# Patient Record
Sex: Male | Born: 1950 | Race: White | Hispanic: No | Marital: Married | Smoking: Former smoker
Health system: Southern US, Community
[De-identification: ages and names within clinical notes are randomized; demographics above are authoritative.]

## PROBLEM LIST (undated history)

## (undated) DIAGNOSIS — T7840XA Allergy, unspecified, initial encounter: Secondary | ICD-10-CM

## (undated) DIAGNOSIS — U071 COVID-19: Secondary | ICD-10-CM

## (undated) DIAGNOSIS — J449 Chronic obstructive pulmonary disease, unspecified: Secondary | ICD-10-CM

## (undated) DIAGNOSIS — I2510 Atherosclerotic heart disease of native coronary artery without angina pectoris: Secondary | ICD-10-CM

## (undated) DIAGNOSIS — D649 Anemia, unspecified: Secondary | ICD-10-CM

## (undated) DIAGNOSIS — N189 Chronic kidney disease, unspecified: Secondary | ICD-10-CM

## (undated) DIAGNOSIS — H269 Unspecified cataract: Secondary | ICD-10-CM

## (undated) DIAGNOSIS — F419 Anxiety disorder, unspecified: Secondary | ICD-10-CM

## (undated) DIAGNOSIS — I10 Essential (primary) hypertension: Secondary | ICD-10-CM

## (undated) DIAGNOSIS — Z5189 Encounter for other specified aftercare: Secondary | ICD-10-CM

## (undated) DIAGNOSIS — J439 Emphysema, unspecified: Secondary | ICD-10-CM

## (undated) HISTORY — DX: Chronic obstructive pulmonary disease, unspecified: J44.9

## (undated) HISTORY — DX: Anxiety disorder, unspecified: F41.9

## (undated) HISTORY — DX: Anemia, unspecified: D64.9

## (undated) HISTORY — DX: Chronic kidney disease, unspecified: N18.9

## (undated) HISTORY — DX: Emphysema, unspecified: J43.9

## (undated) HISTORY — PX: CORONARY ARTERY BYPASS GRAFT: SHX141

## (undated) HISTORY — DX: Unspecified cataract: H26.9

## (undated) HISTORY — PX: CARDIAC SURGERY: SHX584

## (undated) HISTORY — DX: Allergy, unspecified, initial encounter: T78.40XA

---

## 2004-10-05 ENCOUNTER — Ambulatory Visit: Payer: Self-pay | Admitting: Family Medicine

## 2009-06-05 ENCOUNTER — Ambulatory Visit (HOSPITAL_COMMUNITY): Admission: RE | Admit: 2009-06-05 | Discharge: 2009-06-05 | Payer: Self-pay | Admitting: Family Medicine

## 2010-07-23 LAB — CREATININE, SERUM
Creatinine, Ser: 1.05 mg/dL (ref 0.4–1.5)
GFR calc Af Amer: >60 mL/min (ref >60)
GFR calc non Af Amer: >60 mL/min (ref >60)

## 2013-08-11 DIAGNOSIS — F419 Anxiety disorder, unspecified: Secondary | ICD-10-CM | POA: Insufficient documentation

## 2015-05-07 HISTORY — PX: REPAIR OF PERFORATED ULCER: SHX6065

## 2016-04-01 DIAGNOSIS — Z1589 Genetic susceptibility to other disease: Secondary | ICD-10-CM | POA: Insufficient documentation

## 2016-04-01 DIAGNOSIS — F17210 Nicotine dependence, cigarettes, uncomplicated: Secondary | ICD-10-CM | POA: Insufficient documentation

## 2016-09-23 DIAGNOSIS — K922 Gastrointestinal hemorrhage, unspecified: Secondary | ICD-10-CM | POA: Insufficient documentation

## 2016-09-23 DIAGNOSIS — D62 Acute posthemorrhagic anemia: Secondary | ICD-10-CM | POA: Insufficient documentation

## 2016-09-23 DIAGNOSIS — J189 Pneumonia, unspecified organism: Secondary | ICD-10-CM | POA: Insufficient documentation

## 2016-09-23 DIAGNOSIS — K259 Gastric ulcer, unspecified as acute or chronic, without hemorrhage or perforation: Secondary | ICD-10-CM | POA: Insufficient documentation

## 2016-09-23 DIAGNOSIS — I7143 Infrarenal abdominal aortic aneurysm, without rupture: Secondary | ICD-10-CM | POA: Insufficient documentation

## 2016-09-23 DIAGNOSIS — N400 Enlarged prostate without lower urinary tract symptoms: Secondary | ICD-10-CM | POA: Insufficient documentation

## 2016-10-09 DIAGNOSIS — I5022 Chronic systolic (congestive) heart failure: Secondary | ICD-10-CM | POA: Insufficient documentation

## 2016-10-09 DIAGNOSIS — I255 Ischemic cardiomyopathy: Secondary | ICD-10-CM | POA: Insufficient documentation

## 2016-10-15 DIAGNOSIS — R739 Hyperglycemia, unspecified: Secondary | ICD-10-CM | POA: Insufficient documentation

## 2016-10-17 DIAGNOSIS — Z716 Tobacco abuse counseling: Secondary | ICD-10-CM | POA: Insufficient documentation

## 2016-12-02 DIAGNOSIS — Z951 Presence of aortocoronary bypass graft: Secondary | ICD-10-CM | POA: Insufficient documentation

## 2017-02-19 ENCOUNTER — Other Ambulatory Visit: Payer: Self-pay | Admitting: Orthopedic Surgery

## 2017-05-01 ENCOUNTER — Other Ambulatory Visit: Payer: Self-pay | Admitting: Gastroenterology

## 2017-05-01 DIAGNOSIS — R197 Diarrhea, unspecified: Secondary | ICD-10-CM

## 2017-05-01 DIAGNOSIS — R1032 Left lower quadrant pain: Secondary | ICD-10-CM

## 2017-05-05 DIAGNOSIS — F17209 Nicotine dependence, unspecified, with unspecified nicotine-induced disorders: Secondary | ICD-10-CM | POA: Insufficient documentation

## 2017-05-07 ENCOUNTER — Other Ambulatory Visit: Payer: Self-pay

## 2017-06-02 ENCOUNTER — Other Ambulatory Visit: Payer: Self-pay

## 2017-06-02 ENCOUNTER — Encounter (HOSPITAL_COMMUNITY): Payer: Self-pay

## 2017-06-02 ENCOUNTER — Emergency Department (HOSPITAL_COMMUNITY)
Admission: EM | Admit: 2017-06-02 | Discharge: 2017-06-02 | Disposition: A | Discharge status: Home / Self Care | Payer: Medicare Other | Attending: Emergency Medicine | Admitting: Emergency Medicine

## 2017-06-02 DIAGNOSIS — Z79899 Other long term (current) drug therapy: Secondary | ICD-10-CM | POA: Insufficient documentation

## 2017-06-02 DIAGNOSIS — I10 Essential (primary) hypertension: Secondary | ICD-10-CM

## 2017-06-02 DIAGNOSIS — I2510 Atherosclerotic heart disease of native coronary artery without angina pectoris: Secondary | ICD-10-CM | POA: Insufficient documentation

## 2017-06-02 DIAGNOSIS — F17210 Nicotine dependence, cigarettes, uncomplicated: Secondary | ICD-10-CM | POA: Diagnosis not present

## 2017-06-02 DIAGNOSIS — I129 Hypertensive chronic kidney disease with stage 1 through stage 4 chronic kidney disease, or unspecified chronic kidney disease: Secondary | ICD-10-CM | POA: Insufficient documentation

## 2017-06-02 DIAGNOSIS — Z7982 Long term (current) use of aspirin: Secondary | ICD-10-CM | POA: Insufficient documentation

## 2017-06-02 DIAGNOSIS — R195 Other fecal abnormalities: Secondary | ICD-10-CM

## 2017-06-02 DIAGNOSIS — K921 Melena: Secondary | ICD-10-CM | POA: Insufficient documentation

## 2017-06-02 DIAGNOSIS — E876 Hypokalemia: Secondary | ICD-10-CM | POA: Diagnosis not present

## 2017-06-02 DIAGNOSIS — N183 Chronic kidney disease, stage 3 unspecified: Secondary | ICD-10-CM

## 2017-06-02 HISTORY — DX: Essential (primary) hypertension: I10

## 2017-06-02 HISTORY — DX: Atherosclerotic heart disease of native coronary artery without angina pectoris: I25.10

## 2017-06-02 HISTORY — DX: Encounter for other specified aftercare: Z51.89

## 2017-06-02 LAB — TYPE AND SCREEN
ABO/RH(D): O POS
Antibody Screen: NEGATIVE

## 2017-06-02 LAB — COMPREHENSIVE METABOLIC PANEL
ALT: 10 U/L — ABNORMAL LOW (ref 17–63)
AST: 14 U/L — ABNORMAL LOW (ref 15–41)
Albumin: 3.2 g/dL — ABNORMAL LOW (ref 3.5–5.0)
Alkaline Phosphatase: 59 U/L (ref 38–126)
Anion gap: 9 (ref 5–15)
BUN: 14 mg/dL (ref 6–20)
CO2: 25 mmol/L (ref 22–32)
Calcium: 8.7 mg/dL — ABNORMAL LOW (ref 8.9–10.3)
Chloride: 106 mmol/L (ref 101–111)
Creatinine, Ser: 1.51 mg/dL — ABNORMAL HIGH (ref 0.61–1.24)
GFR calc Af Amer: 53 mL/min — ABNORMAL LOW (ref >60)
GFR calc non Af Amer: 46 mL/min — ABNORMAL LOW (ref >60)
Glucose, Bld: 103 mg/dL — ABNORMAL HIGH (ref 65–99)
Potassium: 3.3 mmol/L — ABNORMAL LOW (ref 3.5–5.1)
Sodium: 140 mmol/L (ref 135–145)
Total Bilirubin: 0.5 mg/dL (ref 0.3–1.2)
Total Protein: 5.7 g/dL — ABNORMAL LOW (ref 6.5–8.1)

## 2017-06-02 LAB — CBC
HCT: 38.8 % — ABNORMAL LOW (ref 39.0–52.0)
Hemoglobin: 13 g/dL (ref 13.0–17.0)
MCH: 38.6 pg — ABNORMAL HIGH (ref 26.0–34.0)
MCHC: 33.5 g/dL (ref 30.0–36.0)
MCV: 115.1 fL — ABNORMAL HIGH (ref 78.0–100.0)
Platelets: 375 10*3/uL (ref 150–400)
RBC: 3.37 MIL/uL — ABNORMAL LOW (ref 4.22–5.81)
RDW: 14.5 % (ref 11.5–15.5)
WBC: 7.1 10*3/uL (ref 4.0–10.5)

## 2017-06-02 LAB — ABO/RH: ABO/RH(D): O POS

## 2017-06-02 LAB — POC OCCULT BLOOD, ED: Fecal Occult Bld: NEGATIVE

## 2017-06-02 MED ORDER — POTASSIUM CHLORIDE CRYS ER 20 MEQ PO TBCR
20.0000 meq | EXTENDED_RELEASE_TABLET | Freq: Once | ORAL | Status: AC
  Administered 2017-06-02: 20 meq via ORAL
  Filled 2017-06-02: qty 1

## 2017-06-02 NOTE — ED Triage Notes (Signed)
Pt states he has had black stools that started this morning. Pt states hx of bleeding ulcer. Pt alert and oriented, no distress noted.

## 2017-06-02 NOTE — ED Provider Notes (Signed)
[facility] [facility] EMERGENCY DEPARTMENT Provider Note   CSN: [id number] Arrival date & time: 06/02/17  1355     History   Chief Complaint Chief Complaint  Patient presents with  . Melena    HPI Jonathan Rosales is a 67 y.o. male.  Patient with hx pud, presents c/o that his stools looked very dark today, and he wanted to make sure he didn't have gi bleeding as has hx same due to pud.  Patient denies abd pain. No faintness or dizziness. No vomiting or diarrhea. Takes iron. No peptobismal use. Denies gross blood in stools, rather v dark stools. Symptoms onset today, episodic, persistent. Denies nsaid use. No anticoag use.    The history is provided by the patient.    Past Medical History:  Diagnosis Date  . Blood transfusion without reported diagnosis   . Coronary artery disease   . Hypertension     There are no active problems to display for this patient.   Past Surgical History:  Procedure Laterality Date  . CARDIAC SURGERY         Home Medications    Prior to Admission medications   Medication Sig Start Date End Date Taking? Authorizing Provider  acetaminophen (TYLENOL) 500 MG tablet Take 500 mg by mouth daily as needed for headache (pain).    Yes [provider]  albuterol (PROAIR HFA) 108 (90 Base) MCG/ACT inhaler Inhale 2 puffs into the lungs every 6 (six) hours as needed for wheezing or shortness of breath.  05/13/17 05/13/18 Yes [provider]  aspirin EC 81 MG tablet Take 81 mg by mouth daily with supper.  10/20/16 10/20/17 Yes [provider]  atorvastatin (LIPITOR) 80 MG tablet Take 80 mg by mouth at bedtime. 02/14/17  Yes [provider]  carvedilol (COREG) 6.25 MG tablet Take 6.25 mg by mouth 2 (two) times daily with a meal. 05/02/17  Yes [provider]  clonazePAM (KLONOPIN) 0.5 MG tablet Take 0.25 mg by mouth daily as needed for anxiety.  05/02/17  Yes [provider]  ferrous sulfate  (FEROSUL) 325 (65 FE) MG tablet Take 325 mg by mouth daily with breakfast. 05/02/17  Yes [provider]  fluticasone (FLONASE) 50 MCG/ACT nasal spray Place 2 sprays into both nostrils daily as needed (sinus headache).    Yes [provider]  hydroxyurea (HYDREA) 500 MG capsule Take 500 mg by mouth daily. 05/27/17 11/23/17 Yes [provider]  lisinopril (PRINIVIL,ZESTRIL) 20 MG tablet Take 40 mg by mouth daily. 03/25/17  Yes [provider]  loratadine (CLARITIN) 10 MG tablet Take 10 mg by mouth daily as needed (seasonall allergies).    Yes [provider]  pantoprazole (PROTONIX) 40 MG tablet Take 40 mg by mouth 2 (two) times daily. 05/02/17  Yes [provider]  sucralfate (CARAFATE) 1 g tablet Take 1 g by mouth 3 (three) times daily before meals. Take 1 tablet (1 g) by mouth three times daily 30 minutes before meals 05/19/17  Yes [provider]  Tiotropium Bromide-Olodaterol 2.5-2.5 MCG/ACT AERS Inhale 2 puffs into the lungs daily. 11/26/16  Yes [provider]  vitamin B-12 (CYANOCOBALAMIN) 1000 MCG tablet Take 1,000 mcg by mouth daily.   Yes [provider]    Family History History reviewed. No pertinent family history.  Social History Social History   Tobacco Use  . Smoking status: Current Every Day Smoker    Packs/day: 1.00    Types: Cigarettes  . Smokeless  tobacco: Never Used  Substance Use Topics  . Alcohol use: No    Frequency: Never  . Drug use: No     Allergies   Patient has no known allergies.   Review of Systems Review of Systems  Constitutional: Negative for fever.  HENT: Negative for sore throat.   Eyes: Negative for redness.  Respiratory: Negative for shortness of breath.   Cardiovascular: Negative for chest pain.  Gastrointestinal: Negative for abdominal pain and vomiting.  Genitourinary: Negative for flank pain.  Musculoskeletal: Negative for neck pain.  Skin: Negative for  rash.  Neurological: Negative for light-headedness.  Hematological: Does not bruise/bleed easily.  Psychiatric/Behavioral: Negative for confusion.     Physical Exam Updated Vital Signs BP (!) 159/107 (BP Location: Right Arm)   Pulse 92   Temp 98.7 F (37.1 C) (Oral)   Resp 18   SpO2 96%   Physical Exam  Constitutional: He appears well-developed and well-nourished. No distress.  HENT:  Mouth/Throat: Oropharynx is clear and moist.  Eyes: Conjunctivae are normal.  Neck: Neck supple. No tracheal deviation present.  Cardiovascular: Normal rate, regular rhythm, normal heart sounds and intact distal pulses.  Pulmonary/Chest: Effort normal and breath sounds normal. No accessory muscle usage. No respiratory distress.  Abdominal: Soft. Bowel sounds are normal. He exhibits no distension. There is no tenderness.  Genitourinary:  Genitourinary Comments: Rectal exam, dark greenish stool. No gross blood or melena.   Musculoskeletal: He exhibits no edema.  Neurological: He is alert.  Skin: Skin is warm and dry. No rash noted. He is not diaphoretic.  Psychiatric: He has a normal mood and affect.  Nursing note and vitals reviewed.    ED Treatments / Results  Labs (all labs ordered are listed, but only abnormal results are displayed) Results for orders placed or performed during the hospital encounter of 06/02/17  Comprehensive metabolic panel  Result Value Ref Range   Sodium 140 135 - 145 mmol/L   Potassium 3.3 (L) 3.5 - 5.1 mmol/L   Chloride 106 101 - 111 mmol/L   CO2 25 22 - 32 mmol/L   Glucose, Bld 103 (H) 65 - 99 mg/dL   BUN 14 6 - 20 mg/dL   Creatinine, Ser 1.51 (H) 0.61 - 1.24 mg/dL   Calcium 8.7 (L) 8.9 - 10.3 mg/dL   Total Protein 5.7 (L) 6.5 - 8.1 g/dL   Albumin 3.2 (L) 3.5 - 5.0 g/dL   AST 14 (L) 15 - 41 U/L   ALT 10 (L) 17 - 63 U/L   Alkaline Phosphatase 59 38 - 126 U/L   Total Bilirubin 0.5 0.3 - 1.2 mg/dL   GFR calc non Af Amer 46 (L) >60 mL/min   GFR calc Af Amer  53 (L) >60 mL/min   Anion gap 9 5 - 15  CBC  Result Value Ref Range   WBC 7.1 4.0 - 10.5 K/uL   RBC 3.37 (L) 4.22 - 5.81 MIL/uL   Hemoglobin 13.0 13.0 - 17.0 g/dL   HCT 38.8 (L) 39.0 - 52.0 %   MCV 115.1 (H) 78.0 - 100.0 fL   MCH 38.6 (H) 26.0 - 34.0 pg   MCHC 33.5 30.0 - 36.0 g/dL   RDW 14.5 11.5 - 15.5 %   Platelets 375 150 - 400 K/uL  POC occult blood, ED  Result Value Ref Range   Fecal Occult Bld NEGATIVE NEGATIVE  Type and screen [place]  Result Value Ref Range   ABO/RH(D) O POS  Antibody Screen NEG    Sample Expiration 06/05/2017   ABO/Rh  Result Value Ref Range   ABO/RH(D) O POS     EKG  EKG Interpretation None       Radiology No results found.  Procedures Procedures (including critical care time)  Medications Ordered in ED Medications - No data to display   Initial Impression / Assessment and Plan / ED Course  I have reviewed the triage vital signs and the nursing notes.  Pertinent labs & imaging results that were available during my care of the patient were reviewed by me and considered in my medical decision making (see chart for details).  Labs sent.  Stool sent to lab.  Reviewed nursing notes and prior charts for additional history.   Stool is heme negative.  Pt with CRI, cr is similar today. hgb normal.  k low. kcl po.   Patient appears stable for d/c.     Final Clinical Impressions(s) / ED Diagnoses   Final diagnoses:  None    ED Discharge Orders    None       Lajean Saver, MD 06/02/17 2121

## 2017-06-02 NOTE — ED Notes (Signed)
Patient verbalizes understanding of discharge instructions. Opportunity for questioning and answers were provided. Armband removed by staff, pt discharged from ED to waiting room to wait for wife to pick up.

## 2017-06-02 NOTE — Discharge Instructions (Signed)
It was our pleasure to provide your ER care today - we hope that you feel better.  Your stool tests negative for blood, and your blood count is normal.  Continue protonix (your acid blocker medication).  From today's labs, your potassium level is slightly low (3.3) - eat plenty of fruits and vegetables, and follow up with your doctor.  Your blood pressure is also high tonight - follow up with your doctor in the next couple weeks.

## 2017-08-05 ENCOUNTER — Other Ambulatory Visit: Payer: Self-pay | Admitting: Gastroenterology

## 2017-08-05 DIAGNOSIS — K869 Disease of pancreas, unspecified: Secondary | ICD-10-CM

## 2017-08-16 ENCOUNTER — Ambulatory Visit
Admission: RE | Admit: 2017-08-16 | Discharge: 2017-08-16 | Disposition: A | Discharge status: Home / Self Care | Payer: Medicare Other | Source: Ambulatory Visit | Attending: Gastroenterology | Admitting: Gastroenterology

## 2017-08-16 ENCOUNTER — Other Ambulatory Visit: Payer: Self-pay | Admitting: Gastroenterology

## 2017-08-16 DIAGNOSIS — K869 Disease of pancreas, unspecified: Secondary | ICD-10-CM

## 2017-10-27 ENCOUNTER — Telehealth: Payer: Self-pay | Admitting: Hematology and Oncology

## 2017-10-27 ENCOUNTER — Encounter: Payer: Self-pay | Admitting: Hematology and Oncology

## 2017-10-27 NOTE — Telephone Encounter (Signed)
Pt's wife called wanting to have her husband's care transferred from [facility]. Pt has been scheduled to see Dr. Lindi Adie on 7/29 at 1pm. Pt's previous records are in [facility].

## 2017-11-12 ENCOUNTER — Telehealth: Payer: Self-pay | Admitting: Hematology and Oncology

## 2017-11-12 NOTE — Telephone Encounter (Signed)
Pt's wife called to reschedule appt. Pt's new appt has rescheduled to 8/12 at 1pm.

## 2017-11-17 ENCOUNTER — Ambulatory Visit (INDEPENDENT_AMBULATORY_CARE_PROVIDER_SITE_OTHER): Payer: Medicare Other | Admitting: Physician Assistant

## 2017-11-17 ENCOUNTER — Encounter: Payer: Self-pay | Admitting: Physician Assistant

## 2017-11-17 VITALS — BP 145/96 | HR 71 | Temp 98.7°F | Ht 69.0 in | Wt 128.4 lb

## 2017-11-17 DIAGNOSIS — I2510 Atherosclerotic heart disease of native coronary artery without angina pectoris: Secondary | ICD-10-CM

## 2017-11-17 DIAGNOSIS — N183 Chronic kidney disease, stage 3 unspecified: Secondary | ICD-10-CM

## 2017-11-17 DIAGNOSIS — K219 Gastro-esophageal reflux disease without esophagitis: Secondary | ICD-10-CM

## 2017-11-17 DIAGNOSIS — J449 Chronic obstructive pulmonary disease, unspecified: Secondary | ICD-10-CM | POA: Diagnosis not present

## 2017-11-17 DIAGNOSIS — Z87898 Personal history of other specified conditions: Secondary | ICD-10-CM

## 2017-11-17 DIAGNOSIS — I10 Essential (primary) hypertension: Secondary | ICD-10-CM

## 2017-11-17 DIAGNOSIS — Z0000 Encounter for general adult medical examination without abnormal findings: Secondary | ICD-10-CM

## 2017-11-17 NOTE — Patient Instructions (Signed)
Jonathan Rosales

## 2017-11-18 LAB — CMP14+EGFR
ALT: 10 IU/L (ref 0–44)
AST: 15 IU/L (ref 0–40)
Albumin/Globulin Ratio: 1.6 (ref 1.2–2.2)
Albumin: 4.1 g/dL (ref 3.6–4.8)
Alkaline Phosphatase: 74 IU/L (ref 39–117)
BUN/Creatinine Ratio: 14 (ref 10–24)
BUN: 22 mg/dL (ref 8–27)
Bilirubin Total: 0.3 mg/dL (ref 0.0–1.2)
CO2: 24 mmol/L (ref 20–29)
Calcium: 9.6 mg/dL (ref 8.6–10.2)
Chloride: 101 mmol/L (ref 96–106)
Creatinine, Ser: 1.56 mg/dL — ABNORMAL HIGH (ref 0.76–1.27)
GFR calc Af Amer: 52 mL/min/{1.73_m2} — ABNORMAL LOW (ref >59)
GFR calc non Af Amer: 45 mL/min/{1.73_m2} — ABNORMAL LOW (ref >59)
Globulin, Total: 2.5 g/dL (ref 1.5–4.5)
Glucose: 84 mg/dL (ref 65–99)
Potassium: 4.8 mmol/L (ref 3.5–5.2)
Sodium: 139 mmol/L (ref 134–144)
Total Protein: 6.6 g/dL (ref 6.0–8.5)

## 2017-11-18 LAB — CBC WITH DIFFERENTIAL/PLATELET
Basophils Absolute: 0.1 10*3/uL (ref 0.0–0.2)
Basos: 1 %
EOS (ABSOLUTE): 0.2 10*3/uL (ref 0.0–0.4)
Eos: 2 %
Hematocrit: 43.6 % (ref 37.5–51.0)
Hemoglobin: 15 g/dL (ref 13.0–17.7)
Immature Grans (Abs): 0 10*3/uL (ref 0.0–0.1)
Immature Granulocytes: 0 %
Lymphocytes Absolute: 1.8 10*3/uL (ref 0.7–3.1)
Lymphs: 22 %
MCH: 39.8 pg — ABNORMAL HIGH (ref 26.6–33.0)
MCHC: 34.4 g/dL (ref 31.5–35.7)
MCV: 116 fL — ABNORMAL HIGH (ref 79–97)
Monocytes Absolute: 0.6 10*3/uL (ref 0.1–0.9)
Monocytes: 7 %
Neutrophils Absolute: 5.8 10*3/uL (ref 1.4–7.0)
Neutrophils: 68 %
Platelets: 355 10*3/uL (ref 150–450)
RBC: 3.77 x10E6/uL — ABNORMAL LOW (ref 4.14–5.80)
RDW: 15.9 % — ABNORMAL HIGH (ref 12.3–15.4)
WBC: 8.5 10*3/uL (ref 3.4–10.8)

## 2017-11-18 LAB — LIPID PANEL
Chol/HDL Ratio: 3 ratio (ref 0.0–5.0)
Cholesterol, Total: 121 mg/dL (ref 100–199)
HDL: 41 mg/dL (ref >39)
LDL Calculated: 67 mg/dL (ref 0–99)
Triglycerides: 65 mg/dL (ref 0–149)
VLDL Cholesterol Cal: 13 mg/dL (ref 5–40)

## 2017-11-18 LAB — PSA: Prostate Specific Ag, Serum: 0.9 ng/mL (ref 0.0–4.0)

## 2017-11-18 NOTE — Progress Notes (Signed)
BP (!) 145/96   Pulse 71   Temp 98.7 F (37.1 C) (Oral)   Ht '5\' 9"'  (1.753 m)   Wt 128 lb 6.4 oz (58.2 kg)   BMI 18.96 kg/m    Subjective:    Patient ID: Jonathan Rosales, male    DOB: [birth year], 67 y.o.   MRN: [id number]  HPI: Jonathan Rosales is a 67 y.o. male presenting on 11/17/2017 for New Patient (Initial Visit) and Establish Care  This patient comes in to be established with a new patient.  He has multiple medical conditions.  These include chronic kidney disease stage III, coronary artery disease, hypertension, COPD, GERD, history of ulcer disease.  He also has had elevated platelets.  He sees a hematologist every 6 months for this.  He is under the care of a nephrologist, cardiologist, gastroenterologist.  He would like to get physicians in the [place].  His wife is currently seen by them and he would like to switch over.  His primary care physician has retired.  All of his records have been reviewed.  His medications are reviewed and no refills are needed at this time.  Recent ECHO had improved form 35% EF to 50%.  Past Medical History:  Diagnosis Date  . Allergy   . Anemia   . Anxiety   . Blood transfusion without reported diagnosis   . Cataract   . Chronic kidney disease   . COPD (chronic obstructive pulmonary disease) ([place])   . Coronary artery disease   . Emphysema of lung ([place])   . Hypertension    Relevant past medical, surgical, family and social history reviewed and updated as indicated. Interim medical history since our last visit reviewed. Allergies and medications reviewed and updated. DATA REVIEWED: CHART IN EPIC  Family History reviewed for pertinent findings.  Review of Systems  Constitutional: Positive for fatigue. Negative for appetite change.  HENT: Negative.   Eyes: Negative.  Negative for pain and visual disturbance.  Respiratory: Negative.  Negative for cough, chest tightness, shortness of breath and wheezing.     Cardiovascular: Negative.  Negative for chest pain, palpitations and leg swelling.  Gastrointestinal: Negative.  Negative for abdominal pain, diarrhea, nausea and vomiting.  Endocrine: Negative.   Genitourinary: Negative.   Musculoskeletal: Negative.   Skin: Negative.  Negative for color change and rash.  Neurological: Negative.  Negative for weakness, numbness and headaches.  Psychiatric/Behavioral: Negative.     Allergies as of 11/17/2017   No Known Allergies     Medication List        Accurate as of 11/17/17 11:59 PM. Always use your most recent med list.          acetaminophen 500 MG tablet Commonly known as:  TYLENOL Take 500 mg by mouth daily as needed for headache (pain).   aspirin EC 81 MG tablet Take by mouth.   atorvastatin 80 MG tablet Commonly known as:  LIPITOR Take 80 mg by mouth at bedtime.   carvedilol 6.25 MG tablet Commonly known as:  COREG Take 6.25 mg by mouth 2 (two) times daily with a meal.   clonazePAM 0.5 MG tablet Commonly known as:  KLONOPIN Take 0.25 mg by mouth daily as needed for anxiety.   FEROSUL 325 (65 FE) MG tablet Generic drug:  ferrous sulfate Take 325 mg by mouth daily with breakfast.   fluticasone 50 MCG/ACT nasal spray Commonly known as:  FLONASE Place 2 sprays into both nostrils daily as  needed (sinus headache).   hydroxyurea 500 MG capsule Commonly known as:  HYDREA Take 500 mg by mouth daily.   lisinopril 20 MG tablet Commonly known as:  PRINIVIL,ZESTRIL Take 40 mg by mouth daily.   loratadine 10 MG tablet Commonly known as:  CLARITIN Take 10 mg by mouth daily as needed (seasonall allergies).   pantoprazole 40 MG tablet Commonly known as:  PROTONIX Take 40 mg by mouth 2 (two) times daily.   PROAIR HFA 108 (90 Base) MCG/ACT inhaler Generic drug:  albuterol Inhale 2 puffs into the lungs every 6 (six) hours as needed for wheezing or shortness of breath.   sucralfate 1 g tablet Commonly known as:   CARAFATE Take 1 g by mouth 3 (three) times daily before meals. Take 1 tablet (1 g) by mouth three times daily 30 minutes before meals   Tiotropium Bromide-Olodaterol 2.5-2.5 MCG/ACT Aers Inhale 2 puffs into the lungs daily.   vitamin B-12 1000 MCG tablet Commonly known as:  CYANOCOBALAMIN Take 1,000 mcg by mouth daily.          Objective:    BP (!) 145/96   Pulse 71   Temp 98.7 F (37.1 C) (Oral)   Ht '5\' 9"'  (1.753 m)   Wt 128 lb 6.4 oz (58.2 kg)   BMI 18.96 kg/m   No Known Allergies  Wt Readings from Last 3 Encounters:  11/17/17 128 lb 6.4 oz (58.2 kg)    Physical Exam  Constitutional: He appears well-developed and well-nourished. No distress.  HENT:  Head: Normocephalic and atraumatic.  Eyes: Pupils are equal, round, and reactive to light. Conjunctivae and EOM are normal.  Cardiovascular: Normal rate, regular rhythm and normal heart sounds.  Pulmonary/Chest: Effort normal and breath sounds normal. No respiratory distress.  Skin: Skin is warm and dry.  Psychiatric: He has a normal mood and affect. His behavior is normal.  Nursing note and vitals reviewed.   Results for orders placed or performed in visit on 11/17/17  CMP14+EGFR  Result Value Ref Range   Glucose 84 65 - 99 mg/dL   BUN 22 8 - 27 mg/dL   Creatinine, Ser 1.56 (H) 0.76 - 1.27 mg/dL   GFR calc non Af Amer 45 (L) >59 mL/min/1.73   GFR calc Af Amer 52 (L) >59 mL/min/1.73   BUN/Creatinine Ratio 14 10 - 24   Sodium 139 134 - 144 mmol/L   Potassium 4.8 3.5 - 5.2 mmol/L   Chloride 101 96 - 106 mmol/L   CO2 24 20 - 29 mmol/L   Calcium 9.6 8.6 - 10.2 mg/dL   Total Protein 6.6 6.0 - 8.5 g/dL   Albumin 4.1 3.6 - 4.8 g/dL   Globulin, Total 2.5 1.5 - 4.5 g/dL   Albumin/Globulin Ratio 1.6 1.2 - 2.2   Bilirubin Total 0.3 0.0 - 1.2 mg/dL   Alkaline Phosphatase 74 39 - 117 IU/L   AST 15 0 - 40 IU/L   ALT 10 0 - 44 IU/L  CBC with Differential/Platelet  Result Value Ref Range   WBC 8.5 3.4 - 10.8 x10E3/uL    RBC 3.77 (L) 4.14 - 5.80 x10E6/uL   Hemoglobin 15.0 13.0 - 17.7 g/dL   Hematocrit 43.6 37.5 - 51.0 %   MCV 116 (H) 79 - 97 fL   MCH 39.8 (H) 26.6 - 33.0 pg   MCHC 34.4 31.5 - 35.7 g/dL   RDW 15.9 (H) 12.3 - 15.4 %   Platelets 355 150 - 450 x10E3/uL  Neutrophils 68 Not Estab. %   Lymphs 22 Not Estab. %   Monocytes 7 Not Estab. %   Eos 2 Not Estab. %   Basos 1 Not Estab. %   Neutrophils Absolute 5.8 1.4 - 7.0 x10E3/uL   Lymphocytes Absolute 1.8 0.7 - 3.1 x10E3/uL   Monocytes Absolute 0.6 0.1 - 0.9 x10E3/uL   EOS (ABSOLUTE) 0.2 0.0 - 0.4 x10E3/uL   Basophils Absolute 0.1 0.0 - 0.2 x10E3/uL   Immature Granulocytes 0 Not Estab. %   Immature Grans (Abs) 0.0 0.0 - 0.1 x10E3/uL  Lipid panel  Result Value Ref Range   Cholesterol, Total 121 100 - 199 mg/dL   Triglycerides 65 0 - 149 mg/dL   HDL 41 >39 mg/dL   VLDL Cholesterol Cal 13 5 - 40 mg/dL   LDL Calculated 67 0 - 99 mg/dL   Chol/HDL Ratio 3.0 0.0 - 5.0 ratio  PSA  Result Value Ref Range   Prostate Specific Ag, Serum 0.9 0.0 - 4.0 ng/mL      Assessment & Plan:   1. CKD (chronic kidney disease), stage III [facility]) Continue nephrology  2. Coronary artery disease involving native heart without angina pectoris, unspecified vessel or lesion type - CMP14+EGFR - CBC with Differential/Platelet - Lipid panel - PSA - Ambulatory referral to Cardiology  3. Essential hypertension - CMP14+EGFR - CBC with Differential/Platelet - Lipid panel - PSA - Ambulatory referral to Cardiology  4. Chronic obstructive pulmonary disease, unspecified COPD type ([place]) Continue pulmonolgy  5. Gastroesophageal reflux disease without esophagitis Continue gastroenterology  6. History of ulcer disease  7. Well adult exam - PSA   Continue all other maintenance medications as listed above.  Follow up plan: Return in about 6 months (around 05/20/2018) for recheck.  Educational handout given for [place] PA-C [place] [address]  [place],  [address] [phone]   11/18/2017, 1:25 PM

## 2017-11-28 ENCOUNTER — Other Ambulatory Visit: Payer: Self-pay

## 2017-11-28 MED ORDER — ATORVASTATIN CALCIUM 80 MG PO TABS: 80.0000 mg | ORAL_TABLET | Freq: Every day | ORAL | 1 refills | Status: DC

## 2017-12-01 ENCOUNTER — Encounter: Payer: Medicare Other | Admitting: Hematology and Oncology

## 2017-12-15 ENCOUNTER — Telehealth: Payer: Self-pay | Admitting: Hematology and Oncology

## 2017-12-15 ENCOUNTER — Inpatient Hospital Stay: Payer: Medicare Other | Attending: Hematology and Oncology | Admitting: Hematology and Oncology

## 2017-12-15 DIAGNOSIS — J449 Chronic obstructive pulmonary disease, unspecified: Secondary | ICD-10-CM | POA: Insufficient documentation

## 2017-12-15 DIAGNOSIS — N189 Chronic kidney disease, unspecified: Secondary | ICD-10-CM | POA: Insufficient documentation

## 2017-12-15 DIAGNOSIS — D7589 Other specified diseases of blood and blood-forming organs: Secondary | ICD-10-CM | POA: Diagnosis not present

## 2017-12-15 DIAGNOSIS — Z7982 Long term (current) use of aspirin: Secondary | ICD-10-CM

## 2017-12-15 DIAGNOSIS — F419 Anxiety disorder, unspecified: Secondary | ICD-10-CM

## 2017-12-15 DIAGNOSIS — I129 Hypertensive chronic kidney disease with stage 1 through stage 4 chronic kidney disease, or unspecified chronic kidney disease: Secondary | ICD-10-CM | POA: Diagnosis not present

## 2017-12-15 DIAGNOSIS — Z79899 Other long term (current) drug therapy: Secondary | ICD-10-CM | POA: Diagnosis not present

## 2017-12-15 DIAGNOSIS — D473 Essential (hemorrhagic) thrombocythemia: Secondary | ICD-10-CM | POA: Diagnosis not present

## 2017-12-15 DIAGNOSIS — F17210 Nicotine dependence, cigarettes, uncomplicated: Secondary | ICD-10-CM | POA: Diagnosis not present

## 2017-12-15 DIAGNOSIS — I2510 Atherosclerotic heart disease of native coronary artery without angina pectoris: Secondary | ICD-10-CM | POA: Insufficient documentation

## 2017-12-15 MED ORDER — HYDROXYUREA 500 MG PO CAPS: 500.0000 mg | ORAL_CAPSULE | Freq: Every day | ORAL | 3 refills | Status: DC

## 2017-12-15 NOTE — Assessment & Plan Note (Signed)
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 Jak 2 mutation positive  Current treatment: Hydroxyurea 500 mg daily started 04/01/2016.  Lab review: WBC 8.5, hemoglobin 15, MCV 116, platelets 355 His labs appear to be doing well and he appears to be tolerating the treatment very well.  Recommendation will be to continue the Hydrea at the same dosage.

## 2017-12-15 NOTE — Assessment & Plan Note (Addendum)
Due to hydroxyurea

## 2017-12-15 NOTE — Progress Notes (Signed)
[facility] CONSULT NOTE  Patient Care Team: [name] as PCP - General (Physician Assistant)  CHIEF COMPLAINTS/PURPOSE OF CONSULTATION:  Essential thrombocytosis  HISTORY OF PRESENTING ILLNESS:  Jonathan Rosales 67 y.o. male is here because of history of essential thrombocytosis.  He was diagnosed in July 2017 with a platelet count of greater than 900,000.  He was seen at [place] 2 mutation testing was positive.  With the diagnosis of essential thrombocytosis he was started on hydroxyurea at 500 mg daily.  He did extremely well on hydroxyurea and his platelet counts have normalized.  He does not have any side effects from hydroxyurea. He wanted to switch from the [facility] to [facility] health because he got tired of going to [place]. He had a major hospitalization June 2018 where he was on the ventilator for 3 weeks and had a coronary artery bypass graft done at that time.  Unfortunately continues to smoke.  I reviewed her records extensively and collaborated the history with the patient.  MEDICAL HISTORY:  Past Medical History:  Diagnosis Date  . Allergy   . Anemia   . Anxiety   . Blood transfusion without reported diagnosis   . Cataract   . Chronic kidney disease   . COPD (chronic obstructive pulmonary disease) ([place])   . Coronary artery disease   . Emphysema of lung ([place])   . Hypertension     SURGICAL HISTORY: Past Surgical History:  Procedure Laterality Date  . CARDIAC SURGERY    . CORONARY ARTERY BYPASS GRAFT      SOCIAL HISTORY: Social History   Socioeconomic History  . Marital status: Married    Spouse name: Not on file  . Number of children: Not on file  . Years of education: Not on file  . Highest education level: Not on file  Occupational History  . Not on file  Social Needs  . Financial resource strain: Not on file  . Food insecurity:    Worry: Not on file    Inability: Not on file  . Transportation needs:    Medical:  Not on file    Non-medical: Not on file  Tobacco Use  . Smoking status: Current Every Day Smoker    Packs/day: 1.00    Types: Cigarettes  . Smokeless tobacco: Never Used  Substance and Sexual Activity  . Alcohol use: No    Frequency: Never  . Drug use: No  . Sexual activity: Not Currently  Lifestyle  . Physical activity:    Days per week: Not on file    Minutes per session: Not on file  . Stress: Not on file  Relationships  . Social connections:    Talks on phone: Not on file    Gets together: Not on file    Attends religious service: Not on file    Active member of club or organization: Not on file    Attends meetings of clubs or organizations: Not on file    Relationship status: Not on file  . Intimate partner violence:    Fear of current or ex partner: Not on file    Emotionally abused: Not on file    Physically abused: Not on file    Forced sexual activity: Not on file  Other Topics Concern  . Not on file  Social History Narrative  . Not on file    FAMILY HISTORY: Family History  Problem Relation Age of Onset  . COPD Mother   .  Depression Mother   . Diabetes Mother   . Kidney disease Mother   . Heart disease Father   . Alcohol abuse Brother     ALLERGIES:  has No Known Allergies.  MEDICATIONS:  Current Outpatient Medications  Medication Sig Dispense Refill  . acetaminophen (TYLENOL) 500 MG tablet Take 500 mg by mouth daily as needed for headache (pain).     Marland Kitchen albuterol (PROAIR HFA) 108 (90 Base) MCG/ACT inhaler Inhale 2 puffs into the lungs every 6 (six) hours as needed for wheezing or shortness of breath.     Marland Kitchen aspirin EC 81 MG tablet Take by mouth.    Marland Kitchen atorvastatin (LIPITOR) 80 MG tablet Take 1 tablet (80 mg total) by mouth at bedtime. 90 tablet 1  . carvedilol (COREG) 6.25 MG tablet Take 6.25 mg by mouth 2 (two) times daily with a meal.    . clonazePAM (KLONOPIN) 0.5 MG tablet Take 0.25 mg by mouth daily as needed for anxiety.     . ferrous sulfate  (FEROSUL) 325 (65 FE) MG tablet Take 325 mg by mouth daily with breakfast.    . fluticasone (FLONASE) 50 MCG/ACT nasal spray Place 2 sprays into both nostrils daily as needed (sinus headache).     . hydroxyurea (HYDREA) 500 MG capsule Take 1 capsule (500 mg total) by mouth daily. May take with food to minimize GI side effects. 90 capsule 3  . lisinopril (PRINIVIL,ZESTRIL) 20 MG tablet Take 40 mg by mouth daily.    Marland Kitchen loratadine (CLARITIN) 10 MG tablet Take 10 mg by mouth daily as needed (seasonall allergies).     . pantoprazole (PROTONIX) 40 MG tablet Take 40 mg by mouth 2 (two) times daily.    . sucralfate (CARAFATE) 1 g tablet Take 1 g by mouth 3 (three) times daily before meals. Take 1 tablet (1 g) by mouth three times daily 30 minutes before meals    . Tiotropium Bromide-Olodaterol 2.5-2.5 MCG/ACT AERS Inhale 2 puffs into the lungs daily.    . vitamin B-12 (CYANOCOBALAMIN) 1000 MCG tablet Take 1,000 mcg by mouth daily.     No current facility-administered medications for this visit.     REVIEW OF SYSTEMS:   Constitutional: Denies fevers, chills or abnormal night sweats Eyes: Denies blurriness of vision, double vision or watery eyes Ears, nose, mouth, throat, and face: Denies mucositis or sore throat Respiratory: Denies cough, dyspnea or wheezes Cardiovascular: Denies palpitation, chest discomfort or lower extremity swelling Gastrointestinal:  Denies nausea, heartburn or change in bowel habits Skin: Denies abnormal skin rashes Lymphatics: Denies new lymphadenopathy or easy bruising Neurological:Denies numbness, tingling or new weaknesses Behavioral/Psych: Mood is stable, no new changes    All other systems were reviewed with the patient and are negative.  PHYSICAL EXAMINATION: ECOG PERFORMANCE STATUS: 1 - Symptomatic but completely ambulatory  Vitals:   12/15/17 1313  BP: (!) 135/99  Pulse: 74  Resp: 17  Temp: 97.7 F (36.5 C)  SpO2: 98%   Filed Weights   12/15/17 1313   Weight: 129 lb 12.8 oz (58.9 kg)    GENERAL:alert, no distress and comfortable SKIN: skin color, texture, turgor are normal, no rashes or significant lesions EYES: normal, conjunctiva are pink and non-injected, sclera clear OROPHARYNX:no exudate, no erythema and lips, buccal mucosa, and tongue normal  NECK: supple, thyroid normal size, non-tender, without nodularity LYMPH:  no palpable lymphadenopathy in the cervical, axillary or inguinal LUNGS: clear to auscultation and percussion with normal breathing effort HEART: regular rate &  rhythm and no murmurs and no lower extremity edema ABDOMEN:abdomen soft, non-tender and normal bowel sounds Musculoskeletal:no cyanosis of digits and no clubbing  PSYCH: alert & oriented x 3 with fluent speech NEURO: no focal motor/sensory deficits   LABORATORY DATA:  I have reviewed the data as listed Lab Results  Component Value Date   WBC 8.5 11/17/2017   HGB 15.0 11/17/2017   HCT 43.6 11/17/2017   MCV 116 (H) 11/17/2017   PLT 355 11/17/2017   Lab Results  Component Value Date   NA 139 11/17/2017   K 4.8 11/17/2017   CL 101 11/17/2017   CO2 24 11/17/2017    RADIOGRAPHIC STUDIES: I have personally reviewed the radiological reports and agreed with the findings in the report.  ASSESSMENT AND PLAN:  Macrocytosis without anemia Due to hydroxyurea  Essential thrombocytosis (HCC) [facility] where he was diagnosed with essential thrombocytosis with a platelet count greater than 900,000 as part of a routine wellness exam 03/11/2016. Jak 2 mutation positive  Current treatment: Hydroxyurea 500 mg daily started 04/01/2016.  Lab review: WBC 8.5, hemoglobin 15, MCV 116, platelets 355 His labs appear to be doing well and he appears to be tolerating the treatment very well.  Recommendation will be to continue the Hydrea at the same dosage.   All questions were answered. The patient knows to call the clinic with any problems,  questions or concerns.    Harriette Ohara, MD 12/15/17

## 2017-12-15 NOTE — Telephone Encounter (Signed)
Gave patient avs report and appointments for August 2020.

## 2017-12-19 ENCOUNTER — Other Ambulatory Visit: Payer: Self-pay

## 2017-12-19 ENCOUNTER — Telehealth: Payer: Self-pay

## 2017-12-19 MED ORDER — SUCRALFATE 1 G PO TABS: 1.0000 g | ORAL_TABLET | Freq: Three times a day (TID) | ORAL | 4 refills | Status: DC

## 2017-12-19 NOTE — Telephone Encounter (Signed)
Last seen 11/17/17  [facility]

## 2017-12-19 NOTE — Telephone Encounter (Signed)
Patient needs to follow up in office w/ PCP to sign controlled substance contract for refills on this medication.  It appears that he has establish care recently.  [facility] has never filled this medication for patient before.

## 2017-12-22 ENCOUNTER — Encounter: Payer: Self-pay | Admitting: Physician Assistant

## 2017-12-22 ENCOUNTER — Other Ambulatory Visit: Payer: Self-pay | Admitting: *Deleted

## 2017-12-22 MED ORDER — SUCRALFATE 1 G PO TABS: 1.0000 g | ORAL_TABLET | Freq: Three times a day (TID) | ORAL | 4 refills | Status: DC

## 2017-12-22 MED ORDER — CLONAZEPAM 0.5 MG PO TABS: 0.2500 mg | ORAL_TABLET | Freq: Every day | ORAL | 1 refills | Status: DC | PRN

## 2017-12-26 ENCOUNTER — Encounter: Payer: Self-pay | Admitting: *Deleted

## 2017-12-29 ENCOUNTER — Other Ambulatory Visit: Payer: Self-pay

## 2017-12-29 ENCOUNTER — Ambulatory Visit: Payer: Medicare Other | Admitting: Cardiovascular Disease

## 2017-12-29 ENCOUNTER — Encounter: Payer: Self-pay | Admitting: Cardiovascular Disease

## 2017-12-29 ENCOUNTER — Encounter: Payer: Self-pay | Admitting: *Deleted

## 2017-12-29 VITALS — BP 122/72 | HR 63 | Ht 69.0 in | Wt 126.0 lb

## 2017-12-29 DIAGNOSIS — N183 Chronic kidney disease, stage 3 unspecified: Secondary | ICD-10-CM

## 2017-12-29 DIAGNOSIS — Z716 Tobacco abuse counseling: Secondary | ICD-10-CM

## 2017-12-29 DIAGNOSIS — F17210 Nicotine dependence, cigarettes, uncomplicated: Secondary | ICD-10-CM

## 2017-12-29 DIAGNOSIS — I25708 Atherosclerosis of coronary artery bypass graft(s), unspecified, with other forms of angina pectoris: Secondary | ICD-10-CM

## 2017-12-29 DIAGNOSIS — I10 Essential (primary) hypertension: Secondary | ICD-10-CM

## 2017-12-29 DIAGNOSIS — E785 Hyperlipidemia, unspecified: Secondary | ICD-10-CM

## 2017-12-29 MED ORDER — CARVEDILOL 6.25 MG PO TABS: 6.2500 mg | ORAL_TABLET | Freq: Two times a day (BID) | ORAL | 4 refills | Status: DC

## 2017-12-29 NOTE — Telephone Encounter (Signed)
x

## 2017-12-29 NOTE — Patient Instructions (Signed)

## 2017-12-29 NOTE — Progress Notes (Signed)
CARDIOLOGY CONSULT NOTE  Patient ID: Jonathan Rosales MRN: [id number] DOB/AGE: 1951/04/01 67 y.o.  Admit date: (Not on file) Primary Physician: [name], PA-C Referring Physician: [name], PA-C  Reason for Consultation: Coronary artery disease  HPI: Jonathan Rosales is a 67 y.o. male who is being seen today for the evaluation of coronary artery disease at the request of [name], PA-C.   Past medical history includes essential thrombocytosis which is Jak 2 mutation positive for which she is treated with hydroxyurea, chronic kidney disease stage III, hypertension, COPD, tobacco abuse, and GERD.  He was previously followed by [facility].  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  Lipids 11/17/2017: Total cholesterol 121, triglycerides 65, HDL 41, LDL 67.  Additional labs include BUN 22, creatinine 1.56, sodium 139, potassium 4.8, hemoglobin 15, platelets 355.  His most recent echocardiogram performed within the past few months reportedly demonstrated an LVEF of 50%.  I do not have a copy of this report and will have to request it.  The patient denies any symptoms of chest pain, palpitations, shortness of breath, lightheadedness, dizziness, leg swelling, orthopnea, PND, and syncope.  His biggest issues relate to chronic lower back pain.  He is here with his wife.  He is a Pharmacist, community.  He used to smoke 3 packs/day and now smokes 1 pack of cigarettes daily.  Prior to undergoing CABG, he denied any antecedent symptoms such as chest pain.     No Known Allergies  Current Outpatient Medications  Medication Sig Dispense Refill  . acetaminophen (TYLENOL) 500 MG tablet Take 500 mg by mouth daily as needed for headache (pain).     Marland Kitchen albuterol (PROAIR HFA) 108 (90 Base) MCG/ACT inhaler Inhale 2 puffs into the lungs every 6 (six) hours as needed for wheezing or shortness of breath.     Marland Kitchen aspirin EC 81 MG tablet Take by  mouth.    Marland Kitchen atorvastatin (LIPITOR) 80 MG tablet Take 1 tablet (80 mg total) by mouth at bedtime. 90 tablet 1  . carvedilol (COREG) 6.25 MG tablet Take 6.25 mg by mouth 2 (two) times daily with a meal.    . clonazePAM (KLONOPIN) 0.5 MG tablet Take 0.5 tablets (0.25 mg total) by mouth daily as needed for anxiety. 30 tablet 1  . ferrous sulfate (FEROSUL) 325 (65 FE) MG tablet Take 325 mg by mouth daily with breakfast.    . fluticasone (FLONASE) 50 MCG/ACT nasal spray Place 2 sprays into both nostrils daily as needed (sinus headache).     . hydroxyurea (HYDREA) 500 MG capsule Take 1 capsule (500 mg total) by mouth daily. May take with food to minimize GI side effects. 90 capsule 3  . lisinopril (PRINIVIL,ZESTRIL) 20 MG tablet Take 40 mg by mouth daily.    Marland Kitchen loratadine (CLARITIN) 10 MG tablet Take 10 mg by mouth daily as needed (seasonall allergies).     . pantoprazole (PROTONIX) 40 MG tablet Take 40 mg by mouth 2 (two) times daily.    . sucralfate (CARAFATE) 1 g tablet Take 1 tablet (1 g total) by mouth 3 (three) times daily before meals. 120 tablet 4  . Tiotropium Bromide-Olodaterol 2.5-2.5 MCG/ACT AERS Inhale 2 puffs into the lungs daily.    . vitamin B-12 (CYANOCOBALAMIN) 1000 MCG tablet Take 1,000 mcg by mouth daily.     No current facility-administered medications for this visit.     Past  Medical History:  Diagnosis Date  . Allergy   . Anemia   . Anxiety   . Blood transfusion without reported diagnosis   . Cataract   . Chronic kidney disease   . COPD (chronic obstructive pulmonary disease) ([place])   . Coronary artery disease   . Emphysema of lung ([place])   . Hypertension     Past Surgical History:  Procedure Laterality Date  . CARDIAC SURGERY    . CORONARY ARTERY BYPASS GRAFT      Social History   Socioeconomic History  . Marital status: Married    Spouse name: Not on file  . Number of children: Not on file  . Years of education: Not on file  . Highest education level: Not on  file  Occupational History  . Not on file  Social Needs  . Financial resource strain: Not on file  . Food insecurity:    Worry: Not on file    Inability: Not on file  . Transportation needs:    Medical: Not on file    Non-medical: Not on file  Tobacco Use  . Smoking status: Current Every Day Smoker    Packs/day: 1.00    Types: Cigarettes  . Smokeless tobacco: Never Used  Substance and Sexual Activity  . Alcohol use: No    Frequency: Never  . Drug use: No  . Sexual activity: Not Currently  Lifestyle  . Physical activity:    Days per week: Not on file    Minutes per session: Not on file  . Stress: Not on file  Relationships  . Social connections:    Talks on phone: Not on file    Gets together: Not on file    Attends religious service: Not on file    Active member of club or organization: Not on file    Attends meetings of clubs or organizations: Not on file    Relationship status: Not on file  . Intimate partner violence:    Fear of current or ex partner: Not on file    Emotionally abused: Not on file    Physically abused: Not on file    Forced sexual activity: Not on file  Other Topics Concern  . Not on file  Social History Narrative  . Not on file     No family history of premature CAD in 1st degree relatives.  Current Meds  Medication Sig  . acetaminophen (TYLENOL) 500 MG tablet Take 500 mg by mouth daily as needed for headache (pain).   Marland Kitchen albuterol (PROAIR HFA) 108 (90 Base) MCG/ACT inhaler Inhale 2 puffs into the lungs every 6 (six) hours as needed for wheezing or shortness of breath.   Marland Kitchen aspirin EC 81 MG tablet Take by mouth.  Marland Kitchen atorvastatin (LIPITOR) 80 MG tablet Take 1 tablet (80 mg total) by mouth at bedtime.  . carvedilol (COREG) 6.25 MG tablet Take 6.25 mg by mouth 2 (two) times daily with a meal.  . clonazePAM (KLONOPIN) 0.5 MG tablet Take 0.5 tablets (0.25 mg total) by mouth daily as needed for anxiety.  . ferrous sulfate (FEROSUL) 325 (65 FE) MG  tablet Take 325 mg by mouth daily with breakfast.  . fluticasone (FLONASE) 50 MCG/ACT nasal spray Place 2 sprays into both nostrils daily as needed (sinus headache).   . hydroxyurea (HYDREA) 500 MG capsule Take 1 capsule (500 mg total) by mouth daily. May take with food to minimize GI side effects.  Marland Kitchen lisinopril (PRINIVIL,ZESTRIL) 20 MG tablet Take  40 mg by mouth daily.  Marland Kitchen loratadine (CLARITIN) 10 MG tablet Take 10 mg by mouth daily as needed (seasonall allergies).   . pantoprazole (PROTONIX) 40 MG tablet Take 40 mg by mouth 2 (two) times daily.  . sucralfate (CARAFATE) 1 g tablet Take 1 tablet (1 g total) by mouth 3 (three) times daily before meals.  . Tiotropium Bromide-Olodaterol 2.5-2.5 MCG/ACT AERS Inhale 2 puffs into the lungs daily.  . vitamin B-12 (CYANOCOBALAMIN) 1000 MCG tablet Take 1,000 mcg by mouth daily.      Review of systems complete and found to be negative unless listed above in HPI    Physical exam Blood pressure 122/72, pulse 63, height 5\' 9"  (1.753 m), weight 126 lb (57.2 kg), SpO2 93 %. General: NAD Neck: No JVD, no thyromegaly or thyroid nodule.  Lungs: Clear to auscultation bilaterally with normal respiratory effort. CV: Nondisplaced PMI. Regular rate and rhythm, normal S1/S2, no S3/S4, no murmur.  No peripheral edema.  No carotid bruit.    Abdomen: Soft, nontender, no distention.  Skin: Intact without lesions or rashes.  Neurologic: Alert and oriented x 3.  Psych: Normal affect. Extremities: No clubbing or cyanosis.  HEENT: Normal.   ECG: Most recent ECG reviewed.   Labs: Lab Results  Component Value Date/Time   K 4.8 11/17/2017 10:27 AM   BUN 22 11/17/2017 10:27 AM   CREATININE 1.56 (H) 11/17/2017 10:27 AM   ALT 10 11/17/2017 10:27 AM   HGB 15.0 11/17/2017 10:27 AM     Lipids: Lab Results  Component Value Date/Time   LDLCALC 67 11/17/2017 10:27 AM   CHOL 121 11/17/2017 10:27 AM   TRIG 65 11/17/2017 10:27 AM   HDL 41 11/17/2017 10:27 AM         ASSESSMENT AND PLAN:  1.  Coronary disease with history of three-vessel CABG: Symptomatically stable.  Currently on aspirin, atorvastatin 80 mg, carvedilol, and lisinopril.  I will obtain a copy of his most recent echocardiogram performed within the past few months which reportedly demonstrated normalization of left ventricular systolic function.  He needs tobacco cessation.  2.  Hypertension: Blood pressure is normal.  No changes to therapy.  3.  Hyperlipidemia: Lipids reviewed above.  Continue atorvastatin 80 mg.  4.  Tobacco abuse: Cessation counseling provided (3 minutes).  5. CKD stage III: Stable.  Labs reviewed above.  He needs tobacco cessation.   Disposition: Follow up in 6 months  Signed: Kate Sable, M.D., F.A.C.C.  12/29/2017, 10:30 AM

## 2017-12-30 ENCOUNTER — Encounter: Payer: Self-pay | Admitting: *Deleted

## 2018-01-08 ENCOUNTER — Encounter: Payer: Self-pay | Admitting: Physician Assistant

## 2018-01-19 ENCOUNTER — Encounter: Payer: Self-pay | Admitting: Family Medicine

## 2018-01-24 ENCOUNTER — Ambulatory Visit (INDEPENDENT_AMBULATORY_CARE_PROVIDER_SITE_OTHER): Payer: Medicare Other | Admitting: Pediatrics

## 2018-01-24 ENCOUNTER — Encounter: Payer: Self-pay | Admitting: Pediatrics

## 2018-01-24 VITALS — BP 98/70 | HR 64 | Temp 98.9°F | Wt 128.0 lb

## 2018-01-24 DIAGNOSIS — K529 Noninfective gastroenteritis and colitis, unspecified: Secondary | ICD-10-CM | POA: Diagnosis not present

## 2018-01-24 DIAGNOSIS — I9589 Other hypotension: Secondary | ICD-10-CM

## 2018-01-24 NOTE — Patient Instructions (Signed)
Do not restart carvedilol until top number of blood pressure is greater than 110  Do not restart lisinopril until top number blood pressures greater than 120.  Start with 1 tab.

## 2018-01-24 NOTE — Progress Notes (Signed)
  Subjective:   Patient ID: Jonathan Rosales, male    DOB: Jun 12, 1950, 67 y.o.   MRN: [id number] CC: GI upset (chills, dry heaves, diarrhea this AM around 3:00AM)  HPI: Jonathan Rosales is a 67 y.o. male   Was feeling well yesterday.  Woke up at 3 AM with chills, had about 3 episodes of diarrhea over the next couple hours.  Had some dry heaves.  No blood in his stool.  Feeling well this morning.  Has not yet taken his medicine this morning.  No recent illness, no fevers.  No abdominal pain now.  Stomach felt tight last night during episodes of diarrhea.  No one else at home sick.  A barbecue last night for dinner.  No other unusual foods recently.  Has not yet had breakfast.  Relevant past medical, surgical, family and social history reviewed. Allergies and medications reviewed and updated. Social History   Tobacco Use  Smoking Status Current Every Day Smoker  . Packs/day: 1.00  . Types: Cigarettes  Smokeless Tobacco Never Used   ROS: Per HPI   Objective:    BP 98/70   Pulse 64   Temp 98.9 F (37.2 C) (Oral)   Wt 128 lb (58.1 kg)   BMI 18.90 kg/m   Wt Readings from Last 3 Encounters:  01/24/18 128 lb (58.1 kg)  12/29/17 126 lb (57.2 kg)  12/15/17 129 lb 12.8 oz (58.9 kg)    Gen: NAD, alert, cooperative with exam, NCAT EYES: EOMI, no conjunctival injection, or no icterus CV: NRRR, normal S1/S2, no murmur, distal pulses 2+ b/l Resp: CTABL, no wheezes, normal WOB Abd: +BS, soft, NTND. no guarding or organomegaly Ext: No edema, warm Neuro: Alert and oriented, strength equal b/l UE and LE, coordination grossly normal MSK: normal muscle bulk  Assessment & Plan:  [name] was seen today for gi upset.  Diagnoses and all orders for this visit:  Gastroenteritis Symptoms now resolved.  Possibly related to food.  Return precautions discussed.  Start with bland foods eating today.  Drink plenty of fluids.  Other specified hypotension Blood pressure slightly low today, not on  his blood pressure medicines.  Discussed rechecking blood pressure prior to start of medicines.  Follow up plan: Return if symptoms worsen or fail to improve. Assunta Found, MD [place]

## 2018-01-26 ENCOUNTER — Emergency Department (HOSPITAL_COMMUNITY)
Admission: EM | Admit: 2018-01-26 | Discharge: 2018-01-27 | Disposition: A | Discharge status: Home / Self Care | Payer: Medicare Other | Attending: Emergency Medicine | Admitting: Emergency Medicine

## 2018-01-26 ENCOUNTER — Emergency Department (HOSPITAL_COMMUNITY): Payer: Medicare Other

## 2018-01-26 ENCOUNTER — Encounter (HOSPITAL_COMMUNITY): Payer: Self-pay | Admitting: *Deleted

## 2018-01-26 ENCOUNTER — Other Ambulatory Visit: Payer: Self-pay

## 2018-01-26 DIAGNOSIS — I2510 Atherosclerotic heart disease of native coronary artery without angina pectoris: Secondary | ICD-10-CM | POA: Diagnosis not present

## 2018-01-26 DIAGNOSIS — J449 Chronic obstructive pulmonary disease, unspecified: Secondary | ICD-10-CM | POA: Diagnosis not present

## 2018-01-26 DIAGNOSIS — Z79899 Other long term (current) drug therapy: Secondary | ICD-10-CM | POA: Diagnosis not present

## 2018-01-26 DIAGNOSIS — F419 Anxiety disorder, unspecified: Secondary | ICD-10-CM | POA: Insufficient documentation

## 2018-01-26 DIAGNOSIS — I129 Hypertensive chronic kidney disease with stage 1 through stage 4 chronic kidney disease, or unspecified chronic kidney disease: Secondary | ICD-10-CM | POA: Insufficient documentation

## 2018-01-26 DIAGNOSIS — R1010 Upper abdominal pain, unspecified: Secondary | ICD-10-CM

## 2018-01-26 DIAGNOSIS — Z951 Presence of aortocoronary bypass graft: Secondary | ICD-10-CM | POA: Insufficient documentation

## 2018-01-26 DIAGNOSIS — Z7982 Long term (current) use of aspirin: Secondary | ICD-10-CM | POA: Insufficient documentation

## 2018-01-26 DIAGNOSIS — F17210 Nicotine dependence, cigarettes, uncomplicated: Secondary | ICD-10-CM | POA: Insufficient documentation

## 2018-01-26 DIAGNOSIS — N183 Chronic kidney disease, stage 3 (moderate): Secondary | ICD-10-CM | POA: Diagnosis not present

## 2018-01-26 DIAGNOSIS — K5900 Constipation, unspecified: Secondary | ICD-10-CM | POA: Insufficient documentation

## 2018-01-26 LAB — COMPREHENSIVE METABOLIC PANEL
ALT: 13 U/L (ref 0–44)
AST: 14 U/L — ABNORMAL LOW (ref 15–41)
Albumin: 3.6 g/dL (ref 3.5–5.0)
Alkaline Phosphatase: 68 U/L (ref 38–126)
Anion gap: 8 (ref 5–15)
BUN: 19 mg/dL (ref 8–23)
CO2: 25 mmol/L (ref 22–32)
Calcium: 8.9 mg/dL (ref 8.9–10.3)
Chloride: 106 mmol/L (ref 98–111)
Creatinine, Ser: 1.53 mg/dL — ABNORMAL HIGH (ref 0.61–1.24)
GFR calc Af Amer: 52 mL/min — ABNORMAL LOW (ref >60)
GFR calc non Af Amer: 45 mL/min — ABNORMAL LOW (ref >60)
Glucose, Bld: 108 mg/dL — ABNORMAL HIGH (ref 70–99)
Potassium: 4.1 mmol/L (ref 3.5–5.1)
Sodium: 139 mmol/L (ref 135–145)
Total Bilirubin: 0.5 mg/dL (ref 0.3–1.2)
Total Protein: 6.9 g/dL (ref 6.5–8.1)

## 2018-01-26 LAB — URINALYSIS, ROUTINE W REFLEX MICROSCOPIC
Bilirubin Urine: NEGATIVE
Glucose, UA: NEGATIVE mg/dL
Ketones, ur: NEGATIVE mg/dL
Nitrite: NEGATIVE
Protein, ur: NEGATIVE mg/dL
Specific Gravity, Urine: 1.006 (ref 1.005–1.030)
pH: 6 (ref 5.0–8.0)

## 2018-01-26 LAB — CBC
HCT: 41 % (ref 39.0–52.0)
Hemoglobin: 13.8 g/dL (ref 13.0–17.0)
MCH: 40.4 pg — ABNORMAL HIGH (ref 26.0–34.0)
MCHC: 33.7 g/dL (ref 30.0–36.0)
MCV: 119.9 fL — ABNORMAL HIGH (ref 78.0–100.0)
Platelets: 320 10*3/uL (ref 150–400)
RBC: 3.42 MIL/uL — ABNORMAL LOW (ref 4.22–5.81)
RDW: 13.7 % (ref 11.5–15.5)
WBC: 13.4 10*3/uL — ABNORMAL HIGH (ref 4.0–10.5)

## 2018-01-26 LAB — DIFFERENTIAL
Basophils Absolute: 0.1 10*3/uL (ref 0.0–0.1)
Basophils Relative: 0 %
Eosinophils Absolute: 0.1 10*3/uL (ref 0.0–0.7)
Eosinophils Relative: 1 %
Lymphocytes Relative: 14 %
Lymphs Abs: 1.9 10*3/uL (ref 0.7–4.0)
Monocytes Absolute: 1.5 10*3/uL — ABNORMAL HIGH (ref 0.1–1.0)
Monocytes Relative: 11 %
Neutro Abs: 10.3 10*3/uL — ABNORMAL HIGH (ref 1.7–7.7)
Neutrophils Relative %: 74 %

## 2018-01-26 LAB — LIPASE, BLOOD: Lipase: 31 U/L (ref 11–51)

## 2018-01-26 MED ORDER — IOHEXOL 300 MG/ML  SOLN
75.0000 mL | Freq: Once | INTRAMUSCULAR | Status: AC | PRN
  Administered 2018-01-26: 75 mL via INTRAVENOUS

## 2018-01-26 NOTE — ED Triage Notes (Addendum)
Pt c/o abd pain that is described as a pressure and bloating that started a few weeks ago, pt reports problems with constipation as well, last bowel movement was this am and was a small amount. Pt states that pain is worse after eating,

## 2018-01-27 LAB — TROPONIN I: Troponin I: <0.03 ng/mL (ref <0.03)

## 2018-01-27 MED ORDER — POLYETHYLENE GLYCOL 3350 17 G PO PACK: 17.0000 g | PACK | Freq: Every day | ORAL | 0 refills | Status: DC

## 2018-01-27 NOTE — ED Provider Notes (Signed)
[facility] EMERGENCY DEPARTMENT Provider Note   CSN: [id number] Arrival date & time: 01/26/18  1909     History   Chief Complaint Chief Complaint  Patient presents with  . Abdominal Pain    HPI Jonathan Rosales is a 67 y.o. 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  His symptoms are not associated with n/v/d also denies chest pain or shortness of breath and no fevers or chills.  His discomfort will last for several hours after eating a meal. He is currently symptom free.  He also endorses had a several episodes of diarrhea 3 days ago which completely resolved without intervention.  The history is provided by the patient and the spouse.    Past Medical History:  Diagnosis Date  . Allergy   . Anemia   . Anxiety   . Blood transfusion without reported diagnosis   . Cataract   . Chronic kidney disease   . COPD (chronic obstructive pulmonary disease) ([place])   . Coronary artery disease   . Emphysema of lung ([place])   . Hypertension     Patient Active Problem List   Diagnosis Date Noted  . Macrocytosis without anemia 12/15/2017  . Essential thrombocytosis ([place]) 12/15/2017  . CKD (chronic kidney disease), stage III ([place]) 11/17/2017  . Coronary artery disease involving native heart without angina pectoris 11/17/2017  . Essential hypertension 11/17/2017  . Chronic obstructive pulmonary disease ([place]) 11/17/2017  . History of ulcer disease 11/17/2017  . Gastroesophageal reflux disease without esophagitis 11/17/2017    Past Surgical History:  Procedure Laterality Date  . CARDIAC SURGERY    . CORONARY ARTERY BYPASS GRAFT          Home Medications    Prior to Admission medications   Medication Sig Start Date  End Date Taking? Authorizing Provider  acetaminophen (TYLENOL) 500 MG tablet Take 500 mg by mouth daily as needed for headache (pain).    Yes [provider]  albuterol (PROAIR HFA) 108 (90 Base) MCG/ACT inhaler Inhale 2 puffs into the lungs every 6 (six) hours as needed for wheezing or shortness of breath.  05/13/17 05/13/18 Yes [provider]  aspirin EC 81 MG tablet Take 81 mg by mouth at bedtime.    Yes [provider]  atorvastatin (LIPITOR) 80 MG tablet Take 1 tablet (80 mg total) by mouth at bedtime. 11/28/17  Yes [name], PA-C  carvedilol (COREG) 6.25 MG tablet Take 1 tablet (6.25 mg total) by mouth 2 (two) times daily with a meal. 12/29/17  Yes [name], PA-C  clonazePAM (KLONOPIN) 0.5 MG tablet Take 0.5 tablets (0.25 mg total) by mouth daily as needed for anxiety. 12/22/17  Yes [name], PA-C  ferrous sulfate (FEROSUL) 325 (65 FE) MG tablet Take 325 mg by mouth daily with breakfast. 05/02/17  Yes [provider]  fluticasone (FLONASE) 50 MCG/ACT nasal spray Place 2 sprays into both nostrils daily as needed (sinus headache).    Yes [provider]  hydroxyurea (HYDREA) 500 MG capsule Take 1 capsule (500 mg total) by mouth daily. May take with food to minimize GI side effects. 12/15/17  Yes Nicholas Lose, MD  lisinopril (PRINIVIL,ZESTRIL) 20 MG tablet Take 40 mg  by mouth daily. 03/25/17  Yes [provider]  pantoprazole (PROTONIX) 40 MG tablet Take 40 mg by mouth 2 (two) times daily. 05/02/17  Yes [provider]  sucralfate (CARAFATE) 1 g tablet Take 1 tablet (1 g total) by mouth 3 (three) times daily before meals. 12/22/17  Yes [name], PA-C  Tiotropium Bromide-Olodaterol 2.5-2.5 MCG/ACT AERS Inhale 2 puffs into the lungs daily. 11/26/16  Yes [provider]  vitamin B-12 (CYANOCOBALAMIN) 1000 MCG tablet Take 1,000 mcg by mouth daily.   Yes [provider]  loratadine (CLARITIN) 10 MG tablet  Take 10 mg by mouth daily as needed (seasonall allergies).     [provider]  polyethylene glycol (MIRALAX / GLYCOLAX) packet Take 17 g by mouth daily. 01/27/18   Evalee Jefferson, PA-C    Family History Family History  Problem Relation Age of Onset  . COPD Mother   . Depression Mother   . Diabetes Mother   . Kidney disease Mother   . Heart disease Father   . Alcohol abuse Brother     Social History Social History   Tobacco Use  . Smoking status: Current Every Day Smoker    Packs/day: 1.00    Types: Cigarettes  . Smokeless tobacco: Never Used  Substance Use Topics  . Alcohol use: No    Frequency: Never  . Drug use: No     Allergies   Patient has no known allergies.   Review of Systems Review of Systems  Constitutional: Negative for fever.  HENT: Negative for congestion and sore throat.   Eyes: Negative.   Respiratory: Negative for chest tightness and shortness of breath.   Cardiovascular: Negative for chest pain.  Gastrointestinal: Positive for abdominal pain and constipation. Negative for nausea and vomiting.  Genitourinary: Negative.   Musculoskeletal: Negative for arthralgias, joint swelling and neck pain.  Skin: Negative.  Negative for rash and wound.  Neurological: Negative for dizziness, weakness, light-headedness, numbness and headaches.  Psychiatric/Behavioral: Negative.      Physical Exam Updated Vital Signs BP 127/82   Pulse 74   Temp 98.3 F (36.8 C) (Oral) Comment: VS done by Dodi C., NT  Resp (!) 27   Ht 5\' 9"  (1.753 m)   Wt 58.1 kg   SpO2 96%   BMI 18.90 kg/m   Physical Exam  Constitutional: He appears well-developed and well-nourished.  HENT:  Head: Normocephalic and atraumatic.  Eyes: Conjunctivae are normal.  Neck: Normal range of motion.  Cardiovascular: Normal rate, regular rhythm, normal heart sounds and intact distal pulses.  Pulmonary/Chest: Effort normal and breath sounds normal. He has no wheezes.  Abdominal: Soft.  Bowel sounds are normal. He exhibits no distension and no mass. There is no tenderness.  Pt is currently sx free.  Musculoskeletal: Normal range of motion.  Neurological: He is alert.  Skin: Skin is warm and dry.  Psychiatric: He has a normal mood and affect.  Nursing note and vitals reviewed.    ED Treatments / Results  Labs (all labs ordered are listed, but only abnormal results are displayed) Labs Reviewed  COMPREHENSIVE METABOLIC PANEL - Abnormal; Notable for the following components:      Result Value   Glucose, Bld 108 (*)    Creatinine, Ser 1.53 (*)    AST 14 (*)    GFR calc non Af Amer 45 (*)    GFR calc Af Amer 52 (*)    All other components within normal limits  CBC -  Abnormal; Notable for the following components:   WBC 13.4 (*)    RBC 3.42 (*)    MCV 119.9 (*)    MCH 40.4 (*)    All other components within normal limits  URINALYSIS, ROUTINE W REFLEX MICROSCOPIC - Abnormal; Notable for the following components:   Hgb urine dipstick SMALL (*)    Leukocytes, UA TRACE (*)    Bacteria, UA RARE (*)    All other components within normal limits  DIFFERENTIAL - Abnormal; Notable for the following components:   Neutro Abs 10.3 (*)    Monocytes Absolute 1.5 (*)    All other components within normal limits  LIPASE, BLOOD  TROPONIN I    EKG EKG Interpretation  Date/Time:  Tuesday January 27 2018 01:18:00 EDT Ventricular Rate:  78 PR Interval:    QRS Duration: 117 QT Interval:  384 QTC Calculation: 438 R Axis:   -8 Text Interpretation:  Sinus rhythm Nonspecific intraventricular conduction delay Nonspecific T abnormalities, lateral leads No previous tracing Confirmed by Orpah Greek [phone]) on 01/27/2018 2:01:12 AM   Radiology Ct Abdomen Pelvis W Contrast  Result Date: 01/27/2018 CLINICAL DATA:  67 year old male with abdominal distention. EXAM: CT ABDOMEN AND PELVIS WITH CONTRAST TECHNIQUE: Multidetector CT imaging of the abdomen and pelvis was  performed using the standard protocol following bolus administration of intravenous contrast. CONTRAST:  68mL OMNIPAQUE IOHEXOL 300 MG/ML  SOLN COMPARISON:  CT of the abdomen pelvis dated 10/07/2013 FINDINGS: Evaluation of this exam is limited in the absence of intravenous contrast. Lower chest: There is emphysematous changes of the visualized lung bases with areas of scarring. Coronary vascular calcification noted. No intra-abdominal free air or free fluid. Hepatobiliary: The liver is unremarkable. Small scattered hepatic hypodensities are too small to characterize. No intrahepatic biliary ductal dilatation. The gallbladder is unremarkable. Pancreas: Unremarkable. No pancreatic ductal dilatation or surrounding inflammatory changes. Spleen: Normal in size without focal abnormality. Adrenals/Urinary Tract: Stable 2.5 x 4.0 cm right adrenal lesion not characterized on this CT but similar to prior CT and previously characterized as adenoma. The left adrenal gland is unremarkable. Stable 3.5 cm left renal interpolar cyst as well as an area of cortical scarring along the lateral aspect of the inferior pole of the right kidney. Vascular calcification versus less likely small nonobstructing bilateral renal calculi. No hydronephrosis. The urinary bladder is grossly unremarkable. Stomach/Bowel: There is sigmoid diverticulosis without active inflammatory changes. There is moderate stool throughout the colon. No bowel obstruction or active inflammation. The appendix is not visualized with certainty. No inflammatory changes identified in the right lower quadrant. Vascular/Lymphatic: Moderate aortoiliac atherosclerotic disease. There is a 3.3 cm infrarenal aortic aneurysm increased since the prior CT (previously measuring approximately 3 cm). The IVC is unremarkable as visualized. No portal venous gas. There is no adenopathy. Reproductive: Mildly enlarged prostate gland measuring 4.5 cm in transverse diameter. Other: None  Musculoskeletal: Degenerative changes of the spine. Grade 1 L5-S1 retrolisthesis. No acute osseous pathology. IMPRESSION: 1. No acute intra-abdominal or pelvic pathology. 2. 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 Recommend followup by ultrasound in 3 years. This recommendation follows ACR consensus guidelines: White Paper of the ACR Incidental Findings Committee II on Vascular Findings. J Am Coll Radiol 2013; 09:628-366 3. Moderate Aortic Atherosclerosis (ICD10-I70.0). Electronically Signed   By: Anner Crete M.D.   On: 01/27/2018 00:29    Procedures Procedures (including critical care time)  Medications Ordered in ED Medications  iohexol (OMNIPAQUE) 300 MG/ML solution 75 mL (75 mLs Intravenous  Contrast Given 01/26/18 2357)     Initial Impression / Assessment and Plan / ED Course  I have reviewed the triage vital signs and the nursing notes.  Pertinent labs & imaging results that were available during my care of the patient were reviewed by me and considered in my medical decision making (see chart for details).     Pt with abdominal fullness and bloating which is intermittent, post prandial, denies pain but does have constipation. No obstruction, abscess or other intraabdominal findings to explain sx.  Sx are not painful so doubt mesenteric ischemia. ekg stable, neg trop,  doubt cardiac equivalent.  Miralax, f/u pcp and/or GI (sees [facility]) prn persistent sx.   The patient appears reasonably screened and/or stabilized for discharge and I doubt any other medical condition or other [facility] requiring further screening, evaluation, or treatment in the ED at this time prior to discharge.   Final Clinical Impressions(s) / ED Diagnoses   Final diagnoses:  Pain of upper abdomen  Constipation, unspecified constipation type    ED Discharge Orders         Ordered    polyethylene glycol (MIRALAX / GLYCOLAX) packet  Daily,   Status:  Discontinued     01/27/18 0130    polyethylene glycol (MIRALAX  / GLYCOLAX) packet  Daily     01/27/18 0133           Evalee Jefferson, PA-C 01/27/18 3235    Orpah Greek, MD 01/27/18 [phone]

## 2018-01-27 NOTE — Discharge Instructions (Addendum)
Your labs and CT scan tonight are reassuring. You do have significant constipation and have been prescribed a medication for this (although you can buy this without a prescription).  See your gastroenterologist if your symptoms persist.  You may need to adjust the miralax dosing depending on your response over the next several days.   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

## 2018-02-23 ENCOUNTER — Ambulatory Visit: Payer: Medicare Other | Admitting: Family Medicine

## 2018-02-23 ENCOUNTER — Encounter: Payer: Self-pay | Admitting: Nurse Practitioner

## 2018-02-23 ENCOUNTER — Encounter: Payer: Self-pay | Admitting: Family Medicine

## 2018-02-23 VITALS — BP 132/83 | HR 87 | Temp 97.8°F | Ht 69.0 in | Wt 130.4 lb

## 2018-02-23 DIAGNOSIS — Z0000 Encounter for general adult medical examination without abnormal findings: Secondary | ICD-10-CM

## 2018-02-23 DIAGNOSIS — Z024 Encounter for examination for driving license: Secondary | ICD-10-CM

## 2018-02-23 LAB — URINALYSIS
Bilirubin, UA: NEGATIVE
Glucose, UA: NEGATIVE
Ketones, UA: NEGATIVE
Nitrite, UA: NEGATIVE
Protein, UA: NEGATIVE
RBC, UA: NEGATIVE
Specific Gravity, UA: 1.01 (ref 1.005–1.030)
Urobilinogen, Ur: 0.2 mg/dL (ref 0.2–1.0)
pH, UA: 5 (ref 5.0–7.5)

## 2018-02-23 NOTE — Progress Notes (Signed)
Subjective:  Patient ID: Jonathan Rosales, male    DOB: [birth year]  Age: 67 y.o. 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 Pt. Had bypass June 2018. Smoker, hypertensive.  Depression screen [facility] 2/9 02/23/2018 11/17/2017  Decreased Interest 0 0  Down, Depressed, Hopeless 0 0  PHQ - 2 Score 0 0    History Jonathan Rosales has a past medical history of Allergy, Anemia, Anxiety, Blood transfusion without reported diagnosis, Cataract, Chronic kidney disease, COPD (chronic obstructive pulmonary disease) ([place]), Coronary artery disease, Emphysema of lung ([place]), and Hypertension.   He has a past surgical history that includes Cardiac surgery and Coronary artery bypass graft.   His family history includes Alcohol abuse in his brother; COPD in his mother; Depression in his mother; Diabetes in his mother; Heart disease in his father; Kidney disease in his mother.He reports that he has been smoking cigarettes. He has been smoking about 1.00 pack per day. He has never used smokeless tobacco. He reports that he does not drink alcohol or use drugs.    ROS Review of Systems  Constitutional: Negative.   HENT: Negative.   Eyes: Negative for visual disturbance.  Respiratory: Negative for cough and shortness of breath.   Cardiovascular: Negative for chest pain and leg swelling.  Gastrointestinal: Negative for abdominal pain, diarrhea, nausea and vomiting.  Genitourinary: Negative for difficulty urinating.  Musculoskeletal: Negative for arthralgias and myalgias.  Skin: Negative for rash.  Neurological: Negative for headaches.  Psychiatric/Behavioral: Negative for sleep disturbance.    Objective:  BP 132/83   Pulse 87   Temp 97.8 F (36.6 C) (Oral)   Ht 5\' 9"  (1.753 m)   Wt 130 lb 6.4 oz (59.1 kg)   BMI 19.26 kg/m   BP Readings from Last 3 Encounters:  02/23/18 132/83  01/27/18 127/82  01/24/18 98/70    Wt Readings from Last 3  Encounters:  02/23/18 130 lb 6.4 oz (59.1 kg)  01/26/18 128 lb (58.1 kg)  01/24/18 128 lb (58.1 kg)     Physical Exam  Constitutional: He is oriented to person, place, and time. He appears well-developed and well-nourished.  HENT:  Head: Normocephalic and atraumatic.  Mouth/Throat: Oropharynx is clear and moist.  Eyes: Pupils are equal, round, and reactive to light. EOM are normal.  Neck: Normal range of motion. No tracheal deviation present. No thyromegaly present.  Cardiovascular: Normal rate, regular rhythm and normal heart sounds. Exam reveals no gallop and no friction rub.  No murmur heard. Pulmonary/Chest: Breath sounds normal. He has no wheezes. He has no rales.  Abdominal: Soft. Bowel sounds are normal. He exhibits no distension and no mass. There is no tenderness. Hernia confirmed negative in the right inguinal area and confirmed negative in the left inguinal area.  Genitourinary: Testes normal and penis normal.  Musculoskeletal: Normal range of motion. He exhibits no edema.  Lymphadenopathy:    He has no cervical adenopathy.  Neurological: He is alert and oriented to person, place, and time.  Skin: Skin is warm and dry.  Psychiatric: He has a normal mood and affect.      Assessment & Plan:   Jonathan Rosales was seen today for private dot.  Diagnoses and all orders for this visit:  Annual physical exam -     Urinalysis  Encounter for Department of Transportation (DOT) examination for driving license renewal       I am having Jonathan Rosales. Jonathan Rosales maintain his  acetaminophen, lisinopril, pantoprazole, vitamin B-12, ferrous sulfate, fluticasone, albuterol, loratadine, Tiotropium Bromide-Olodaterol, aspirin EC, atorvastatin, hydroxyurea, clonazePAM, sucralfate, carvedilol, and polyethylene glycol.  Allergies as of 02/23/2018   No Known Allergies     Medication List        Accurate as of 02/23/18 10:46 AM. Always use your most recent med list.          acetaminophen  500 MG tablet Commonly known as:  TYLENOL Take 500 mg by mouth daily as needed for headache (pain).   aspirin EC 81 MG tablet Take 81 mg by mouth at bedtime.   atorvastatin 80 MG tablet Commonly known as:  LIPITOR Take 1 tablet (80 mg total) by mouth at bedtime.   carvedilol 6.25 MG tablet Commonly known as:  COREG Take 1 tablet (6.25 mg total) by mouth 2 (two) times daily with a meal.   clonazePAM 0.5 MG tablet Commonly known as:  KLONOPIN Take 0.5 tablets (0.25 mg total) by mouth daily as needed for anxiety.   FEROSUL 325 (65 FE) MG tablet Generic drug:  ferrous sulfate Take 325 mg by mouth daily with breakfast.   fluticasone 50 MCG/ACT nasal spray Commonly known as:  FLONASE Place 2 sprays into both nostrils daily as needed (sinus headache).   hydroxyurea 500 MG capsule Commonly known as:  HYDREA Take 1 capsule (500 mg total) by mouth daily. May take with food to minimize GI side effects.   lisinopril 20 MG tablet Commonly known as:  PRINIVIL,ZESTRIL Take 40 mg by mouth daily.   loratadine 10 MG tablet Commonly known as:  CLARITIN Take 10 mg by mouth daily as needed (seasonall allergies).   pantoprazole 40 MG tablet Commonly known as:  PROTONIX Take 40 mg by mouth 2 (two) times daily.   polyethylene glycol packet Commonly known as:  MIRALAX / GLYCOLAX Take 17 g by mouth daily.   PROAIR HFA 108 (90 Base) MCG/ACT inhaler Generic drug:  albuterol Inhale 2 puffs into the lungs every 6 (six) hours as needed for wheezing or shortness of breath.   sucralfate 1 g tablet Commonly known as:  CARAFATE Take 1 tablet (1 g total) by mouth 3 (three) times daily before meals.   Tiotropium Bromide-Olodaterol 2.5-2.5 MCG/ACT Aers Inhale 2 puffs into the lungs daily.   vitamin B-12 1000 MCG tablet Commonly known as:  CYANOCOBALAMIN Take 1,000 mcg by mouth daily.        Follow-up: Return in about 1 year (around 02/24/2019).  Claretta Fraise, M.D.

## 2018-04-22 ENCOUNTER — Other Ambulatory Visit: Payer: Self-pay | Admitting: Physician Assistant

## 2018-04-23 ENCOUNTER — Telehealth: Payer: Self-pay | Admitting: Physician Assistant

## 2018-04-23 NOTE — Telephone Encounter (Signed)
Pt given appt with Particia Nearing 06/08/18 at 8:10 for refills on medication.

## 2018-04-28 ENCOUNTER — Other Ambulatory Visit: Payer: Self-pay | Admitting: Physician Assistant

## 2018-04-30 ENCOUNTER — Other Ambulatory Visit: Payer: Self-pay | Admitting: *Deleted

## 2018-04-30 MED ORDER — TIOTROPIUM BROMIDE-OLODATEROL 2.5-2.5 MCG/ACT IN AERS: 2.0000 | INHALATION_SPRAY | Freq: Every day | RESPIRATORY_TRACT | 0 refills | Status: DC

## 2018-04-30 MED ORDER — LISINOPRIL 20 MG PO TABS: 40.0000 mg | ORAL_TABLET | Freq: Every day | ORAL | 0 refills | Status: DC

## 2018-04-30 MED ORDER — PANTOPRAZOLE SODIUM 40 MG PO TBEC: 40.0000 mg | DELAYED_RELEASE_TABLET | Freq: Two times a day (BID) | ORAL | 0 refills | Status: DC

## 2018-04-30 MED ORDER — FERROUS SULFATE 325 (65 FE) MG PO TABS: 325.0000 mg | ORAL_TABLET | Freq: Every day | ORAL | 0 refills | Status: DC

## 2018-04-30 NOTE — Telephone Encounter (Signed)
Last seen 02/24/18  Angel 

## 2018-05-10 ENCOUNTER — Encounter: Payer: Self-pay | Admitting: Physician Assistant

## 2018-05-11 ENCOUNTER — Other Ambulatory Visit: Payer: Self-pay | Admitting: Physician Assistant

## 2018-05-11 MED ORDER — AZITHROMYCIN 250 MG PO TABS: ORAL_TABLET | ORAL | 0 refills | Status: DC

## 2018-05-18 ENCOUNTER — Encounter: Payer: Self-pay | Admitting: Physician Assistant

## 2018-05-18 ENCOUNTER — Ambulatory Visit (INDEPENDENT_AMBULATORY_CARE_PROVIDER_SITE_OTHER): Payer: Medicare Other | Admitting: Physician Assistant

## 2018-05-18 VITALS — BP 134/85 | HR 75 | Temp 97.0°F | Ht 69.0 in | Wt 129.2 lb

## 2018-05-18 DIAGNOSIS — N183 Chronic kidney disease, stage 3 unspecified: Secondary | ICD-10-CM

## 2018-05-18 DIAGNOSIS — I2510 Atherosclerotic heart disease of native coronary artery without angina pectoris: Secondary | ICD-10-CM

## 2018-05-18 DIAGNOSIS — K219 Gastro-esophageal reflux disease without esophagitis: Secondary | ICD-10-CM

## 2018-05-18 MED ORDER — TIOTROPIUM BROMIDE-OLODATEROL 2.5-2.5 MCG/ACT IN AERS: 2.0000 | INHALATION_SPRAY | Freq: Every day | RESPIRATORY_TRACT | 5 refills | Status: DC

## 2018-05-18 MED ORDER — ALBUTEROL SULFATE HFA 108 (90 BASE) MCG/ACT IN AERS: 2.0000 | INHALATION_SPRAY | Freq: Four times a day (QID) | RESPIRATORY_TRACT | 2 refills | Status: DC | PRN

## 2018-05-18 MED ORDER — CARVEDILOL 6.25 MG PO TABS: 6.2500 mg | ORAL_TABLET | Freq: Two times a day (BID) | ORAL | 3 refills | Status: DC

## 2018-05-18 MED ORDER — FERROUS SULFATE 325 (65 FE) MG PO TABS: 325.0000 mg | ORAL_TABLET | Freq: Every day | ORAL | 3 refills | Status: DC

## 2018-05-18 MED ORDER — ATORVASTATIN CALCIUM 80 MG PO TABS: 80.0000 mg | ORAL_TABLET | Freq: Every day | ORAL | 3 refills | Status: DC

## 2018-05-18 MED ORDER — CLONAZEPAM 0.5 MG PO TABS: 0.5000 mg | ORAL_TABLET | Freq: Every day | ORAL | 5 refills | Status: DC

## 2018-05-18 MED ORDER — LISINOPRIL 20 MG PO TABS: 40.0000 mg | ORAL_TABLET | Freq: Every day | ORAL | 3 refills | Status: DC

## 2018-05-18 MED ORDER — SUCRALFATE 1 G PO TABS: 1.0000 g | ORAL_TABLET | Freq: Three times a day (TID) | ORAL | 4 refills | Status: DC

## 2018-05-18 MED ORDER — PANTOPRAZOLE SODIUM 40 MG PO TBEC: 40.0000 mg | DELAYED_RELEASE_TABLET | Freq: Two times a day (BID) | ORAL | 3 refills | Status: DC

## 2018-05-19 NOTE — Progress Notes (Signed)
BP 134/85   Pulse 75   Temp (!) 97 F (36.1 C) (Oral)   Ht 5\' 9"  (1.753 m)   Wt 129 lb 3.2 oz (58.6 kg)   BMI 19.08 kg/m    Subjective:    Patient ID: Jonathan Rosales, male    DOB: [birth year], 68 y.o.   MRN: [id number]  HPI: Jonathan Rosales is a 68 y.o. male presenting on 05/18/2018 for Hypertension (6 month follow up )  This patient comes in for periodic recheck on medications and conditions including coronary artery disease, GERD, chronic kidney disease.  He has been seen by nephrology, cardiology, hematology.  He did have some low platelets but things are settled down now.  He does have follow-up appointments with his specialist.  He denies any issues at this time.  He does need refills on many medications.  He would like to have them printed because he is going to work on where the lowest price for the medications are since it is beginning about a year..   All medications are reviewed today. There are no reports of any problems with the medications. All of the medical conditions are reviewed and updated.  Lab work is reviewed and will be ordered as medically necessary. There are no new problems reported with today's visit.   Past Medical History:  Diagnosis Date  . Allergy   . Anemia   . Anxiety   . Blood transfusion without reported diagnosis   . Cataract   . Chronic kidney disease   . COPD (chronic obstructive pulmonary disease) ([place])   . Coronary artery disease   . Emphysema of lung ([place])   . Hypertension    Relevant past medical, surgical, family and social history reviewed and updated as indicated. Interim medical history since our last visit reviewed. Allergies and medications reviewed and updated. DATA REVIEWED: CHART IN EPIC  Family History reviewed for pertinent findings.  Review of Systems  Constitutional: Negative.  Negative for appetite change and fatigue.  HENT: Negative.   Eyes: Negative.  Negative for pain and visual disturbance.  Respiratory:  Negative.  Negative for cough, chest tightness, shortness of breath and wheezing.   Cardiovascular: Negative.  Negative for chest pain, palpitations and leg swelling.  Gastrointestinal: Negative.  Negative for abdominal pain, diarrhea, nausea and vomiting.  Endocrine: Negative.   Genitourinary: Negative.   Musculoskeletal: Negative.   Skin: Negative.  Negative for color change and rash.  Neurological: Negative.  Negative for weakness, numbness and headaches.  Psychiatric/Behavioral: Negative.     Allergies as of 05/18/2018   No Known Allergies     Medication List       Accurate as of May 18, 2018 11:59 PM. Always use your most recent med list.        acetaminophen 500 MG tablet Commonly known as:  TYLENOL Take 500 mg by mouth daily as needed for headache (pain).   albuterol 108 (90 Base) MCG/ACT inhaler Commonly known as:  PROAIR HFA Inhale 2 puffs into the lungs every 6 (six) hours as needed for wheezing or shortness of breath.   aspirin EC 81 MG tablet Take 81 mg by mouth at bedtime.   atorvastatin 80 MG tablet Commonly known as:  LIPITOR Take 1 tablet (80 mg total) by mouth at bedtime.   carvedilol 6.25 MG tablet Commonly known as:  COREG Take 1 tablet (6.25 mg total) by mouth 2 (two) times daily with a meal.   clonazePAM 0.5 MG tablet Commonly  known as:  KLONOPIN Take 1 tablet (0.5 mg total) by mouth daily.   ferrous sulfate 325 (65 FE) MG tablet Commonly known as:  FEROSUL Take 1 tablet (325 mg total) by mouth daily with breakfast.   fluticasone 50 MCG/ACT nasal spray Commonly known as:  FLONASE Place 2 sprays into both nostrils daily as needed (sinus headache).   hydroxyurea 500 MG capsule Commonly known as:  HYDREA Take 1 capsule (500 mg total) by mouth daily. May take with food to minimize GI side effects.   lisinopril 20 MG tablet Commonly known as:  PRINIVIL,ZESTRIL Take 2 tablets (40 mg total) by mouth daily.   loratadine 10 MG  tablet Commonly known as:  CLARITIN Take 10 mg by mouth daily as needed (seasonall allergies).   pantoprazole 40 MG tablet Commonly known as:  PROTONIX Take 1 tablet (40 mg total) by mouth 2 (two) times daily.   polyethylene glycol packet Commonly known as:  MIRALAX / GLYCOLAX Take 17 g by mouth daily.   sucralfate 1 g tablet Commonly known as:  CARAFATE Take 1 tablet (1 g total) by mouth 3 (three) times daily before meals.   Tiotropium Bromide-Olodaterol 2.5-2.5 MCG/ACT Aers Inhale 2 puffs into the lungs daily.   vitamin B-12 1000 MCG tablet Commonly known as:  CYANOCOBALAMIN Take 1,000 mcg by mouth daily.          Objective:    BP 134/85   Pulse 75   Temp (!) 97 F (36.1 C) (Oral)   Ht 5\' 9"  (1.753 m)   Wt 129 lb 3.2 oz (58.6 kg)   BMI 19.08 kg/m   No Known Allergies  Wt Readings from Last 3 Encounters:  05/18/18 129 lb 3.2 oz (58.6 kg)  02/23/18 130 lb 6.4 oz (59.1 kg)  01/26/18 128 lb (58.1 kg)    Physical Exam Vitals signs and nursing note reviewed.  Constitutional:      General: He is not in acute distress.    Appearance: He is well-developed.  HENT:     Head: Normocephalic and atraumatic.  Eyes:     Conjunctiva/sclera: Conjunctivae normal.     Pupils: Pupils are equal, round, and reactive to light.  Cardiovascular:     Rate and Rhythm: Normal rate and regular rhythm.     Heart sounds: Normal heart sounds.  Pulmonary:     Effort: Pulmonary effort is normal. No respiratory distress.     Breath sounds: Normal breath sounds.  Skin:    General: Skin is warm and dry.  Psychiatric:        Behavior: Behavior normal.     Results for orders placed or performed in visit on 02/23/18  Urinalysis  Result Value Ref Range   Specific Gravity, UA 1.010 1.005 - 1.030   pH, UA 5.0 5.0 - 7.5   Color, UA Yellow Yellow   Appearance Ur Clear Clear   Leukocytes, UA Trace (A) Negative   Protein, UA Negative Negative/Trace   Glucose, UA Negative Negative    Ketones, UA Negative Negative   RBC, UA Negative Negative   Bilirubin, UA Negative Negative   Urobilinogen, Ur 0.2 0.2 - 1.0 mg/dL   Nitrite, UA Negative Negative      Assessment & Plan:   1. CKD (chronic kidney disease), stage III (HCC) - lisinopril (PRINIVIL,ZESTRIL) 20 MG tablet; Take 2 tablets (40 mg total) by mouth daily.  Dispense: 90 tablet; Refill: 3  2. Coronary artery disease involving native heart without angina pectoris, unspecified  vessel or lesion type - atorvastatin (LIPITOR) 80 MG tablet; Take 1 tablet (80 mg total) by mouth at bedtime.  Dispense: 90 tablet; Refill: 3 - carvedilol (COREG) 6.25 MG tablet; Take 1 tablet (6.25 mg total) by mouth 2 (two) times daily with a meal.  Dispense: 180 tablet; Refill: 3  3. Gastroesophageal reflux disease without esophagitis - pantoprazole (PROTONIX) 40 MG tablet; Take 1 tablet (40 mg total) by mouth 2 (two) times daily.  Dispense: 90 tablet; Refill: 3 - sucralfate (CARAFATE) 1 g tablet; Take 1 tablet (1 g total) by mouth 3 (three) times daily before meals.  Dispense: 120 tablet; Refill: 4   Continue all other maintenance medications as listed above.  Follow up plan: Return in about 6 months (around 11/16/2018) for recheck and labs.  Educational handout given for [place] PA-C [facility] [address]  [place], [place] [address] [phone]   05/19/2018, 5:24 PM

## 2018-06-08 ENCOUNTER — Ambulatory Visit: Payer: Medicare Other | Admitting: Physician Assistant

## 2018-09-14 ENCOUNTER — Other Ambulatory Visit: Payer: Self-pay | Admitting: Nurse Practitioner

## 2018-10-09 ENCOUNTER — Other Ambulatory Visit: Payer: Self-pay | Admitting: Physician Assistant

## 2018-10-09 ENCOUNTER — Other Ambulatory Visit: Payer: Self-pay | Admitting: Nurse Practitioner

## 2018-10-09 DIAGNOSIS — K219 Gastro-esophageal reflux disease without esophagitis: Secondary | ICD-10-CM

## 2018-11-05 ENCOUNTER — Other Ambulatory Visit: Payer: Self-pay | Admitting: Physician Assistant

## 2018-11-05 ENCOUNTER — Other Ambulatory Visit: Payer: Self-pay | Admitting: Nurse Practitioner

## 2018-11-05 DIAGNOSIS — I2510 Atherosclerotic heart disease of native coronary artery without angina pectoris: Secondary | ICD-10-CM

## 2018-11-23 ENCOUNTER — Other Ambulatory Visit: Payer: Self-pay | Admitting: Physician Assistant

## 2018-11-23 DIAGNOSIS — K219 Gastro-esophageal reflux disease without esophagitis: Secondary | ICD-10-CM

## 2018-11-23 DIAGNOSIS — N183 Chronic kidney disease, stage 3 unspecified: Secondary | ICD-10-CM

## 2018-12-07 ENCOUNTER — Other Ambulatory Visit: Payer: Self-pay | Admitting: Physician Assistant

## 2018-12-07 NOTE — Assessment & Plan Note (Signed)
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 Jak 2 mutation positive  Current treatment: Hydroxyurea 500 mg daily started 04/01/2016.  Lab review:  His labs appear to be doing well and he appears to be tolerating the treatment very well.  Recommendation will be to continue the Hydrea at the same dosage. Return to clinic in 1 year for follow-up

## 2018-12-13 NOTE — Progress Notes (Signed)
Patient Care Team: [name] as PCP - General (Physician Assistant)  DIAGNOSIS:    ICD-10-CM   1. Essential thrombocytosis (HCC)  D47.3     CHIEF COMPLIANT: Follow-up of essential thrombocytosis  INTERVAL HISTORY: Jonathan Rosales is a 68 y.o. with above-mentioned history of essential thrombocytosis who is currently on hydroxyurea 500mg  daily. Labs on 11/17/18 showed platelets 413,000. He presents to the clinic today for annual follow-up.   REVIEW OF SYSTEMS:   Constitutional: Denies fevers, chills or abnormal weight loss Eyes: Denies blurriness of vision Ears, nose, mouth, throat, and face: Denies mucositis or sore throat Respiratory: Denies cough, dyspnea or wheezes Cardiovascular: Denies palpitation, chest discomfort Gastrointestinal: Denies nausea, heartburn or change in bowel habits Skin: Denies abnormal skin rashes Lymphatics: Denies new lymphadenopathy or easy bruising Neurological: Denies numbness, tingling or new weaknesses Behavioral/Psych: Mood is stable, no new changes  Extremities: No lower extremity edema Breast: denies any pain or lumps or nodules in either breasts All other systems were reviewed with the patient and are negative.  I have reviewed the past medical history, past surgical history, social history and family history with the patient and they are unchanged from previous note.  ALLERGIES:  has No Known Allergies.  MEDICATIONS:  Current Outpatient Medications  Medication Sig Dispense Refill  . acetaminophen (TYLENOL) 500 MG tablet Take 500 mg by mouth daily as needed for headache (pain).     Marland Kitchen albuterol (PROAIR HFA) 108 (90 Base) MCG/ACT inhaler Inhale 2 puffs into the lungs every 6 (six) hours as needed for wheezing or shortness of breath. 18 g 2  . aspirin EC 81 MG tablet Take 81 mg by mouth at bedtime.     Marland Kitchen atorvastatin (LIPITOR) 80 MG tablet Take 1 tablet (80 mg total) by mouth at bedtime. 90 tablet 3  . carvedilol (COREG) 6.25 MG  tablet TAKE 1 TABLET 2 TIMES A DAY WITH A MEAL 60 tablet 0  . clonazePAM (KLONOPIN) 0.5 MG tablet TAKE 1 TABLET ONCE DAILY 30 tablet 0  . FEROSUL 325 (65 Fe) MG tablet TAKE 1 TABLET DAILY 30 tablet 3  . fluticasone (FLONASE) 50 MCG/ACT nasal spray Place 2 sprays into both nostrils daily as needed (sinus headache).     . hydroxyurea (HYDREA) 500 MG capsule Take 1 capsule (500 mg total) by mouth daily. May take with food to minimize GI side effects. 90 capsule 3  . lisinopril (ZESTRIL) 20 MG tablet TAKE (2) TABLETS DAILY 90 tablet 0  . loratadine (CLARITIN) 10 MG tablet Take 10 mg by mouth daily as needed (seasonall allergies).     . pantoprazole (PROTONIX) 40 MG tablet TAKE  (1)  TABLET TWICE A DAY. 90 tablet 0  . polyethylene glycol (MIRALAX / GLYCOLAX) packet Take 17 g by mouth daily. 14 each 0  . STIOLTO RESPIMAT 2.5-2.5 MCG/ACT AERS Inhale 2 puffs into the lungs daily. 4 g 0  . sucralfate (CARAFATE) 1 g tablet Take 1 tablet (1 g total) by mouth 3 (three) times daily before meals. 120 tablet 4  . vitamin B-12 (CYANOCOBALAMIN) 1000 MCG tablet Take 1,000 mcg by mouth daily.     No current facility-administered medications for this visit.     PHYSICAL EXAMINATION: ECOG PERFORMANCE STATUS: 0 - Asymptomatic  There were no vitals filed for this visit. There were no vitals filed for this visit.  GENERAL: alert, no distress and comfortable SKIN: skin color, texture, turgor are normal, no rashes or significant lesions EYES: normal,  Conjunctiva are pink and non-injected, sclera clear OROPHARYNX: no exudate, no erythema and lips, buccal mucosa, and tongue normal  NECK: supple, thyroid normal size, non-tender, without nodularity LYMPH: no palpable lymphadenopathy in the cervical, axillary or inguinal LUNGS: clear to auscultation and percussion with normal breathing effort HEART: regular rate & rhythm and no murmurs and no lower extremity edema ABDOMEN: abdomen soft, non-tender and normal bowel  sounds MUSCULOSKELETAL: no cyanosis of digits and no clubbing  NEURO: alert & oriented x 3 with fluent speech, no focal motor/sensory deficits EXTREMITIES: No lower extremity edema  LABORATORY DATA:  I have reviewed the data as listed CMP Latest Ref Rng & Units 01/26/2018 11/17/2017 06/02/2017  Glucose 70 - 99 mg/dL 108(H) 84 103(H)  BUN 8 - 23 mg/dL 19 22 14   Creatinine 0.61 - 1.24 mg/dL 1.53(H) 1.56(H) 1.51(H)  Sodium 135 - 145 mmol/L 139 139 140  Potassium 3.5 - 5.1 mmol/L 4.1 4.8 3.3(L)  Chloride 98 - 111 mmol/L 106 101 106  CO2 22 - 32 mmol/L 25 24 25   Calcium 8.9 - 10.3 mg/dL 8.9 9.6 8.7(L)  Total Protein 6.5 - 8.1 g/dL 6.9 6.6 5.7(L)  Total Bilirubin 0.3 - 1.2 mg/dL 0.5 0.3 0.5  Alkaline Phos 38 - 126 U/L 68 74 59  AST 15 - 41 U/L 14(L) 15 14(L)  ALT 0 - 44 U/L 13 10 10(L)    Lab Results  Component Value Date   WBC 13.4 (H) 01/26/2018   HGB 13.8 01/26/2018   HCT 41.0 01/26/2018   MCV 119.9 (H) 01/26/2018   PLT 320 01/26/2018   NEUTROABS 10.3 (H) 01/26/2018    ASSESSMENT & PLAN:  Essential thrombocytosis ([place]) Patient was followed at [facility] where he was diagnosed with essential thrombocytosis with a platelet count greater than 900,000 as part of a routine wellness exam 03/11/2016. Jak 2 mutation positive  Current treatment: Hydroxyurea 500 mg daily started 04/01/2016.  Lab review: WBC 9.2, hemoglobin 14.3, platelet count 392 His labs appear to be doing well and he appears to be tolerating the treatment very well.  Recommendation will be to continue the Hydrea at the same dosage. I renewed hydroxyurea for another year. Return to clinic in 1 year for follow-up    No orders of the defined types were placed in this encounter.  The patient has a good understanding of the overall plan. he agrees with it. he will call with any problems that may develop before the next visit here.  Nicholas Lose, MD 12/14/2018  Julious Oka Dorshimer am acting as scribe for Dr. Nicholas Lose.  I have reviewed the above documentation for accuracy and completeness, and I agree with the above.

## 2018-12-14 ENCOUNTER — Inpatient Hospital Stay: Payer: Medicare Other

## 2018-12-14 ENCOUNTER — Inpatient Hospital Stay: Payer: Medicare Other | Attending: Hematology and Oncology | Admitting: Hematology and Oncology

## 2018-12-14 ENCOUNTER — Other Ambulatory Visit: Payer: Self-pay

## 2018-12-14 ENCOUNTER — Other Ambulatory Visit: Payer: Self-pay | Admitting: Physician Assistant

## 2018-12-14 DIAGNOSIS — Z7982 Long term (current) use of aspirin: Secondary | ICD-10-CM | POA: Diagnosis not present

## 2018-12-14 DIAGNOSIS — Z79899 Other long term (current) drug therapy: Secondary | ICD-10-CM | POA: Insufficient documentation

## 2018-12-14 DIAGNOSIS — D473 Essential (hemorrhagic) thrombocythemia: Secondary | ICD-10-CM

## 2018-12-14 DIAGNOSIS — I2510 Atherosclerotic heart disease of native coronary artery without angina pectoris: Secondary | ICD-10-CM

## 2018-12-14 DIAGNOSIS — D7589 Other specified diseases of blood and blood-forming organs: Secondary | ICD-10-CM

## 2018-12-14 LAB — CBC WITH DIFFERENTIAL (CANCER CENTER ONLY)
Abs Immature Granulocytes: 0.02 10*3/uL (ref 0.00–0.07)
Basophils Absolute: 0.1 10*3/uL (ref 0.0–0.1)
Basophils Relative: 1 %
Eosinophils Absolute: 0.3 10*3/uL (ref 0.0–0.5)
Eosinophils Relative: 3 %
HCT: 42.1 % (ref 39.0–52.0)
Hemoglobin: 14.3 g/dL (ref 13.0–17.0)
Immature Granulocytes: 0 %
Lymphocytes Relative: 24 %
Lymphs Abs: 2.2 10*3/uL (ref 0.7–4.0)
MCH: 39 pg — ABNORMAL HIGH (ref 26.0–34.0)
MCHC: 34 g/dL (ref 30.0–36.0)
MCV: 114.7 fL — ABNORMAL HIGH (ref 80.0–100.0)
Monocytes Absolute: 0.8 10*3/uL (ref 0.1–1.0)
Monocytes Relative: 8 %
Neutro Abs: 5.8 10*3/uL (ref 1.7–7.7)
Neutrophils Relative %: 64 %
Platelet Count: 392 10*3/uL (ref 150–400)
RBC: 3.67 MIL/uL — ABNORMAL LOW (ref 4.22–5.81)
RDW: 13.2 % (ref 11.5–15.5)
WBC Count: 9.2 10*3/uL (ref 4.0–10.5)
nRBC: 0 % (ref 0.0–0.2)

## 2018-12-14 MED ORDER — HYDROXYUREA 500 MG PO CAPS: 500.0000 mg | ORAL_CAPSULE | Freq: Every day | ORAL | 3 refills | Status: DC

## 2018-12-14 NOTE — Telephone Encounter (Signed)
yrly OV 03/08/19

## 2018-12-15 ENCOUNTER — Telehealth: Payer: Self-pay | Admitting: Hematology and Oncology

## 2018-12-15 NOTE — Telephone Encounter (Signed)
I left a message regarding schedule  

## 2018-12-21 ENCOUNTER — Other Ambulatory Visit: Payer: Self-pay | Admitting: Physician Assistant

## 2019-01-03 ENCOUNTER — Encounter: Payer: Self-pay | Admitting: Physician Assistant

## 2019-01-04 ENCOUNTER — Telehealth: Payer: Self-pay | Admitting: Cardiovascular Disease

## 2019-01-04 DIAGNOSIS — I2510 Atherosclerotic heart disease of native coronary artery without angina pectoris: Secondary | ICD-10-CM

## 2019-01-04 NOTE — Telephone Encounter (Signed)
Per Dr.Koneswaran, lexiscan ordered    Jonathan Rosales, front desk will schedule apt with patient

## 2019-01-04 NOTE — Telephone Encounter (Signed)
That would be fine 

## 2019-01-04 NOTE — Telephone Encounter (Signed)
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

## 2019-01-13 ENCOUNTER — Other Ambulatory Visit: Payer: Self-pay | Admitting: Physician Assistant

## 2019-01-13 DIAGNOSIS — N183 Chronic kidney disease, stage 3 unspecified: Secondary | ICD-10-CM

## 2019-01-13 DIAGNOSIS — K219 Gastro-esophageal reflux disease without esophagitis: Secondary | ICD-10-CM

## 2019-01-18 ENCOUNTER — Encounter (HOSPITAL_BASED_OUTPATIENT_CLINIC_OR_DEPARTMENT_OTHER)
Admission: RE | Admit: 2019-01-18 | Discharge: 2019-01-18 | Disposition: A | Discharge status: Home / Self Care | Payer: Medicare Other | Source: Ambulatory Visit | Attending: Cardiovascular Disease | Admitting: Cardiovascular Disease

## 2019-01-18 ENCOUNTER — Encounter (HOSPITAL_COMMUNITY)
Admission: RE | Admit: 2019-01-18 | Discharge: 2019-01-18 | Disposition: A | Discharge status: Home / Self Care | Payer: Medicare Other | Source: Ambulatory Visit | Attending: Cardiovascular Disease | Admitting: Cardiovascular Disease

## 2019-01-18 ENCOUNTER — Encounter (HOSPITAL_COMMUNITY): Payer: Self-pay

## 2019-01-18 ENCOUNTER — Other Ambulatory Visit: Payer: Self-pay

## 2019-01-18 DIAGNOSIS — I2510 Atherosclerotic heart disease of native coronary artery without angina pectoris: Secondary | ICD-10-CM

## 2019-01-18 LAB — NM MYOCAR MULTI W/SPECT W/WALL MOTION / EF
LV dias vol: 84 mL (ref 62–150)
LV sys vol: 38 mL
Peak HR: 100 {beats}/min
RATE: 0.31
Rest HR: 66 {beats}/min
SDS: 0
SRS: 1
SSS: 1
TID: 1.1

## 2019-01-18 MED ORDER — REGADENOSON 0.4 MG/5ML IV SOLN
INTRAVENOUS | Status: AC
  Administered 2019-01-18: 0.4 mg via INTRAVENOUS
  Filled 2019-01-18: qty 5

## 2019-01-18 MED ORDER — TECHNETIUM TC 99M TETROFOSMIN IV KIT
10.0000 | PACK | Freq: Once | INTRAVENOUS | Status: AC | PRN
  Administered 2019-01-18: 09:00:00 9.3 via INTRAVENOUS

## 2019-01-18 MED ORDER — TECHNETIUM TC 99M TETROFOSMIN IV KIT
30.0000 | PACK | Freq: Once | INTRAVENOUS | Status: AC | PRN
  Administered 2019-01-18: 10:00:00 30 via INTRAVENOUS

## 2019-01-18 MED ORDER — SODIUM CHLORIDE 0.9% FLUSH
INTRAVENOUS | Status: AC
  Administered 2019-01-18: 10 mL via INTRAVENOUS
  Filled 2019-01-18: qty 10

## 2019-01-25 ENCOUNTER — Other Ambulatory Visit: Payer: Self-pay | Admitting: Physician Assistant

## 2019-01-26 ENCOUNTER — Other Ambulatory Visit: Payer: Self-pay | Admitting: Physician Assistant

## 2019-02-02 ENCOUNTER — Telehealth: Payer: Self-pay | Admitting: Cardiovascular Disease

## 2019-02-02 NOTE — Telephone Encounter (Signed)
Virtual Visit Pre-Appointment Phone Call  "(Name), I am calling you today to discuss your upcoming appointment. We are currently trying to limit exposure to the virus that causes COVID-19 by seeing patients at home rather than in the office."  1. "What is the BEST phone number to call the day of the visit?" - include this in appointment notes  2. Do you have or have access to (through a family member/friend) a smartphone with video capability that we can use for your visit?" a. If yes - list this number in appt notes as cell (if different from BEST phone #) and list the appointment type as a VIDEO visit in appointment notes b. If no - list the appointment type as a PHONE visit in appointment notes  3. Confirm consent - "In the setting of the current Covid19 crisis, you are scheduled for a (phone or video) visit with your provider on (date) at (time).  Just as we do with many in-office visits, in order for you to participate in this visit, we must obtain consent.  If you'd like, I can send this to your mychart (if signed up) or email for you to review.  Otherwise, I can obtain your verbal consent now.  All virtual visits are billed to your insurance company just like a normal visit would be.  By agreeing to a virtual visit, we'd like you to understand that the technology does not allow for your provider to perform an examination, and thus may limit your provider's ability to fully assess your condition. If your provider identifies any concerns that need to be evaluated in person, we will make arrangements to do so.  Finally, though the technology is pretty good, we cannot assure that it will always work on either your or our end, and in the setting of a video visit, we may have to convert it to a phone-only visit.  In either situation, we cannot ensure that we have a secure connection.  Are you willing to proceed?" STAFF: Did the patient verbally acknowledge consent to telehealth visit? Document  YES/NO here: yes  4. Advise patient to be prepared - "Two hours prior to your appointment, go ahead and check your blood pressure, pulse, oxygen saturation, and your weight (if you have the equipment to check those) and write them all down. When your visit starts, your provider will ask you for this information. If you have an Apple Watch or Kardia device, please plan to have heart rate information ready on the day of your appointment. Please have a pen and paper handy nearby the day of the visit as well."  5. Give patient instructions for MyChart download to smartphone OR [email or web address] as below if video visit (depending on what platform provider is using)  6. Inform patient they will receive a phone call 15 minutes prior to their appointment time (may be from unknown caller ID) so they should be prepared to answer    TELEPHONE CALL NOTE  Jonathan Rosales has been deemed a candidate for a follow-up tele-health visit to limit community exposure during the Covid-19 pandemic. I spoke with the patient via phone to ensure availability of phone/video source, confirm preferred email & phone number, and discuss instructions and expectations.  I reminded Jonathan Rosales to be prepared with any vital sign and/or heart rhythm information that could potentially be obtained via home monitoring, at the time of his visit. I reminded Jonathan Rosales to expect a phone call prior to  his visit.  Jonathan Rosales 02/02/2019 2:06 PM   INSTRUCTIONS FOR DOWNLOADING THE MYCHART APP TO SMARTPHONE  - The patient must first make sure to have activated MyChart and know their login information - If Apple, go to [employer] and type in MyChart in the search bar and download the app. If Android, ask patient to go to [employer] and type in [place] in the search bar and download the app. The app is free but as with any other app downloads, their phone may require them to verify saved payment information or  Apple/Android password.  - The patient will need to then log into the app with their MyChart username and password, and select [facility] as their healthcare provider to link the account. When it is time for your visit, go to the MyChart app, find appointments, and click Begin Video Visit. Be sure to Select Allow for your device to access the Microphone and Camera for your visit. You will then be connected, and your provider will be with you shortly.  **If they have any issues connecting, or need assistance please contact MyChart service desk [phone])**  **If using a computer, in order to ensure the best quality for their visit they will need to use either of the following Internet Browsers: [employer], or Google Chrome**  IF USING DOXIMITY or DOXY.ME - The patient will receive a link just prior to their visit by text.     FULL LENGTH CONSENT FOR TELE-HEALTH VISIT   I hereby voluntarily request, consent and authorize [facility] and its employed or contracted physicians, physician assistants, nurse practitioners or other licensed health care professionals (the Practitioner), to provide me with telemedicine health care services (the Services") as deemed necessary by the treating Practitioner. I acknowledge and consent to receive the Services by the Practitioner via telemedicine. I understand that the telemedicine visit will involve communicating with the Practitioner through live audiovisual communication technology and the disclosure of certain medical information by electronic transmission. I acknowledge that I have been given the opportunity to request an in-person assessment or other available alternative prior to the telemedicine visit and am voluntarily participating in the telemedicine visit.  I understand that I have the right to withhold or withdraw my consent to the use of telemedicine in the course of my care at any time, without affecting my right to future care  or treatment, and that the Practitioner or I may terminate the telemedicine visit at any time. I understand that I have the right to inspect all information obtained and/or recorded in the course of the telemedicine visit and may receive copies of available information for a reasonable fee.  I understand that some of the potential risks of receiving the Services via telemedicine include:   Delay or interruption in medical evaluation due to technological equipment failure or disruption;  Information transmitted may not be sufficient (e.g. poor resolution of images) to allow for appropriate medical decision making by the Practitioner; and/or   In rare instances, security protocols could fail, causing a breach of personal health information.  Furthermore, I acknowledge that it is my responsibility to provide information about my medical history, conditions and care that is complete and accurate to the best of my ability. I acknowledge that Practitioner's advice, recommendations, and/or decision may be based on factors not within their control, such as incomplete or inaccurate data provided by me or distortions of diagnostic images or specimens that may result from electronic transmissions. I  understand that the practice of medicine is not an exact science and that Practitioner makes no warranties or guarantees regarding treatment outcomes. I acknowledge that I will receive a copy of this consent concurrently upon execution via email to the email address I last provided but may also request a printed copy by calling the office of [facility].    I understand that my insurance will be billed for this visit.   I have read or had this consent read to me.  I understand the contents of this consent, which adequately explains the benefits and risks of the Services being provided via telemedicine.   I have been provided ample opportunity to ask questions regarding this consent and the Services and have had  my questions answered to my satisfaction.  I give my informed consent for the services to be provided through the use of telemedicine in my medical care  By participating in this telemedicine visit I agree to the above.

## 2019-02-03 ENCOUNTER — Encounter: Payer: Self-pay | Admitting: Physician Assistant

## 2019-02-03 ENCOUNTER — Ambulatory Visit (INDEPENDENT_AMBULATORY_CARE_PROVIDER_SITE_OTHER): Payer: Medicare Other | Admitting: Physician Assistant

## 2019-02-03 DIAGNOSIS — F419 Anxiety disorder, unspecified: Secondary | ICD-10-CM | POA: Diagnosis not present

## 2019-02-03 DIAGNOSIS — Z0000 Encounter for general adult medical examination without abnormal findings: Secondary | ICD-10-CM

## 2019-02-03 MED ORDER — CLONAZEPAM 0.5 MG PO TABS: 0.5000 mg | ORAL_TABLET | Freq: Every day | ORAL | 2 refills | Status: DC

## 2019-02-03 NOTE — Progress Notes (Signed)
Telephone visit  Subjective: CC: Recheck on anxiety and need for annual well labs PCP: [name], PA-C Jonathan Rosales is a 68 y.o. male calls for telephone consult today. Patient provides verbal consent for consult held via phone.  Patient is identified with 2 separate identifiers.  At this time the entire area is on COVID-19 social distancing and stay home orders are in place.  Patient is of higher risk and therefore we are performing this by a virtual method.  Location of patient: Home Location of provider: HOME Others present for call: No  Patient is having.  Recheck on his anxiety and does need his annual well labs.  An order will be placed and he will come have those in the next 2 weeks.  He takes his anxiety medicine on an as-needed basis.  He does not take it twice daily regular.  The PDMP is checked and he does not have any red flags.  Refill will be sent on this medication.  He denies having any other significant issues at this time.  He is seeing his specialist.  He does see urology and they have his PSA performed.   ROS: Per HPI  No Known Allergies Past Medical History:  Diagnosis Date  . Allergy   . Anemia   . Anxiety   . Blood transfusion without reported diagnosis   . Cataract   . Chronic kidney disease   . COPD (chronic obstructive pulmonary disease) ([place])   . Coronary artery disease   . Emphysema of lung ([place])   . Hypertension     Current Outpatient Medications:  .  acetaminophen (TYLENOL) 500 MG tablet, Take 500 mg by mouth daily as needed for headache (pain). , Disp: , Rfl:  .  albuterol (PROAIR HFA) 108 (90 Base) MCG/ACT inhaler, Inhale 2 puffs into the lungs every 6 (six) hours as needed for wheezing or shortness of breath., Disp: 18 g, Rfl: 2 .  aspirin EC 81 MG tablet, Take 81 mg by mouth at bedtime. , Disp: , Rfl:  .  atorvastatin (LIPITOR) 80 MG tablet, Take 1 tablet (80 mg total) by mouth at bedtime., Disp: 90 tablet, Rfl: 3 .   carvedilol (COREG) 6.25 MG tablet, TAKE 1 TABLET 2 TIMES A DAY WITH A MEAL, Disp: 60 tablet, Rfl: 3 .  clonazePAM (KLONOPIN) 0.5 MG tablet, Take 1 tablet (0.5 mg total) by mouth daily., Disp: 30 tablet, Rfl: 2 .  FEROSUL 325 (65 Fe) MG tablet, TAKE 1 TABLET DAILY, Disp: 30 tablet, Rfl: 3 .  fluticasone (FLONASE) 50 MCG/ACT nasal spray, Place 2 sprays into both nostrils daily as needed (sinus headache). , Disp: , Rfl:  .  hydroxyurea (HYDREA) 500 MG capsule, Take 1 capsule (500 mg total) by mouth daily. May take with food to minimize GI side effects., Disp: 90 capsule, Rfl: 3 .  lisinopril (ZESTRIL) 20 MG tablet, TAKE (2) TABLETS DAILY, Disp: 90 tablet, Rfl: 0 .  loratadine (CLARITIN) 10 MG tablet, Take 10 mg by mouth daily as needed (seasonall allergies). , Disp: , Rfl:  .  pantoprazole (PROTONIX) 40 MG tablet, TAKE  (1)  TABLET TWICE A DAY., Disp: 90 tablet, Rfl: 0 .  polyethylene glycol (MIRALAX / GLYCOLAX) packet, Take 17 g by mouth daily., Disp: 14 each, Rfl: 0 .  STIOLTO RESPIMAT 2.5-2.5 MCG/ACT AERS, Inhale 2 puffs into the lungs daily., Disp: 4 g, Rfl: 0 .  sucralfate (CARAFATE) 1 g tablet, Take 1 tablet (1 g total)  by mouth 3 (three) times daily before meals., Disp: 120 tablet, Rfl: 4 .  vitamin B-12 (CYANOCOBALAMIN) 1000 MCG tablet, Take 1,000 mcg by mouth daily., Disp: , Rfl:   Assessment/ Plan: 68 y.o. male   1. Well adult exam - CBC with Differential/Platelet; Future - CMP14+EGFR; Future - Lipid panel; Future  2. Anxiety - clonazePAM (KLONOPIN) 0.5 MG tablet; Take 1 tablet (0.5 mg total) by mouth daily.  Dispense: 30 tablet; Refill: 2   No follow-ups on file.  Continue all other maintenance medications as listed above.  Start time: 8:22 AM End time: 8:30 AM  Meds ordered this encounter  Medications  . clonazePAM (KLONOPIN) 0.5 MG tablet    Sig: Take 1 tablet (0.5 mg total) by mouth daily.    Dispense:  30 tablet    Refill:  2    Order Specific Question:   Supervising  Provider    Answer:   Janora Norlander [[id number]]    Particia Nearing PA-C [place] [phone]

## 2019-02-05 ENCOUNTER — Other Ambulatory Visit: Payer: Self-pay

## 2019-02-08 ENCOUNTER — Other Ambulatory Visit: Payer: Self-pay

## 2019-02-08 ENCOUNTER — Telehealth (INDEPENDENT_AMBULATORY_CARE_PROVIDER_SITE_OTHER): Payer: Medicare Other | Admitting: Cardiovascular Disease

## 2019-02-08 ENCOUNTER — Encounter: Payer: Self-pay | Admitting: Family Medicine

## 2019-02-08 ENCOUNTER — Encounter: Payer: Self-pay | Admitting: Cardiovascular Disease

## 2019-02-08 ENCOUNTER — Ambulatory Visit: Payer: Medicare Other | Admitting: Family Medicine

## 2019-02-08 VITALS — BP 133/86 | HR 78 | Ht 68.0 in | Wt 128.0 lb

## 2019-02-08 VITALS — BP 108/75 | HR 90 | Ht 68.0 in | Wt 129.0 lb

## 2019-02-08 DIAGNOSIS — I25708 Atherosclerosis of coronary artery bypass graft(s), unspecified, with other forms of angina pectoris: Secondary | ICD-10-CM | POA: Diagnosis not present

## 2019-02-08 DIAGNOSIS — Z024 Encounter for examination for driving license: Secondary | ICD-10-CM

## 2019-02-08 DIAGNOSIS — E785 Hyperlipidemia, unspecified: Secondary | ICD-10-CM

## 2019-02-08 DIAGNOSIS — I10 Essential (primary) hypertension: Secondary | ICD-10-CM

## 2019-02-08 DIAGNOSIS — Z0000 Encounter for general adult medical examination without abnormal findings: Secondary | ICD-10-CM

## 2019-02-08 DIAGNOSIS — Z720 Tobacco use: Secondary | ICD-10-CM

## 2019-02-08 DIAGNOSIS — N183 Chronic kidney disease, stage 3 unspecified: Secondary | ICD-10-CM

## 2019-02-08 LAB — URINALYSIS
Bilirubin, UA: NEGATIVE
Glucose, UA: NEGATIVE
Ketones, UA: NEGATIVE
Nitrite, UA: NEGATIVE
Protein,UA: NEGATIVE
Specific Gravity, UA: 1.015 (ref 1.005–1.030)
Urobilinogen, Ur: 0.2 mg/dL (ref 0.2–1.0)
pH, UA: 5 (ref 5.0–7.5)

## 2019-02-08 MED ORDER — LISINOPRIL 20 MG PO TABS: 20.0000 mg | ORAL_TABLET | Freq: Every day | ORAL | 3 refills | Status: DC

## 2019-02-08 MED ORDER — LISINOPRIL 20 MG PO TABS: 20.0000 mg | ORAL_TABLET | Freq: Every day | ORAL | Status: DC

## 2019-02-08 NOTE — Patient Instructions (Addendum)
Medication Instructions:   Decrease Lisinopril to 20mg  daily.  New prescription sent to pharmacy today.   Continue all other medications.    Labwork: none  Testing/Procedures: none  Follow-Up: Your physician wants you to follow up in:  1 year.  You will receive a reminder letter in the mail one-two months in advance.  If you don't receive a letter, please call our office to schedule the follow up appointment   Any Other Special Instructions Will Be Listed Below (If Applicable).  If you need a refill on your cardiac medications before your next appointment, please call your pharmacy.

## 2019-02-08 NOTE — Progress Notes (Signed)
Subjective:  Patient ID: Jonathan Rosales, male    DOB: [birth year]  Age: 68 y.o. MRN: [id number]  CC: DOT PE   HPI [name] presents for DOT certification medical clearance. Wears hearing aids, glasses. Hx of Bypass surgery . Recent nuclear stress test with low to intermediate probability. Taking beta blocker.   Depression screen [facility] 2/9 02/08/2019 05/18/2018 02/23/2018  Decreased Interest 0 0 0  Down, Depressed, Hopeless 0 0 0  PHQ - 2 Score 0 0 0    History Jaise has a past medical history of Allergy, Anemia, Anxiety, Blood transfusion without reported diagnosis, Cataract, Chronic kidney disease, COPD (chronic obstructive pulmonary disease) ([place]), Coronary artery disease, Emphysema of lung ([place]), and Hypertension.   He has a past surgical history that includes Cardiac surgery and Coronary artery bypass graft.   His family history includes Alcohol abuse in his brother; COPD in his mother; Depression in his mother; Diabetes in his mother; Heart disease in his father; Kidney disease in his mother.He reports that he has been smoking cigarettes. He has been smoking about 1.00 pack per day. He has never used smokeless tobacco. He reports that he does not drink alcohol or use drugs.    ROS Review of Systems  Constitutional: Negative.   HENT: Negative.   Eyes: Negative for visual disturbance.  Respiratory: Negative for cough and shortness of breath.   Cardiovascular: Negative for chest pain and leg swelling.  Gastrointestinal: Negative for abdominal pain, diarrhea, nausea and vomiting.  Genitourinary: Negative for difficulty urinating.  Musculoskeletal: Negative for arthralgias and myalgias.  Skin: Negative for rash.  Neurological: Negative for headaches.  Psychiatric/Behavioral: Negative for sleep disturbance.    Objective:  BP 133/86   Pulse 78   Ht _0  (1.727 m)   Wt 128 lb (58.1 kg)   BMI 19.46 kg/m   BP Readings from Last 3 Encounters:  02/08/19 133/86   02/08/19 108/75  12/14/18 127/84    Wt Readings from Last 3 Encounters:  02/08/19 128 lb (58.1 kg)  02/08/19 129 lb (58.5 kg)  12/14/18 129 lb 1.6 oz (58.6 kg)     Physical Exam Constitutional:      General: He is not in acute distress.    Appearance: He is well-developed.  HENT:     Head: Normocephalic and atraumatic.     Right Ear: External ear normal.     Left Ear: External ear normal.     Nose: Nose normal.  Eyes:     Conjunctiva/sclera: Conjunctivae normal.     Pupils: Pupils are equal, round, and reactive to light.  Neck:     Musculoskeletal: Normal range of motion and neck supple.  Cardiovascular:     Rate and Rhythm: Normal rate and regular rhythm.     Heart sounds: Normal heart sounds. No murmur.  Pulmonary:     Effort: Pulmonary effort is normal. No respiratory distress.     Breath sounds: Normal breath sounds. No wheezing or rales.  Abdominal:     Palpations: Abdomen is soft.     Tenderness: There is no abdominal tenderness.  Musculoskeletal: Normal range of motion.  Skin:    General: Skin is warm and dry.  Neurological:     Mental Status: He is alert and oriented to person, place, and time.     Deep Tendon Reflexes: Reflexes are normal and symmetric.  Psychiatric:        Behavior: Behavior normal.        Thought  Content: Thought content normal.        Judgment: Judgment normal.       Assessment & Plan:   Orlo was seen today for dot pe.  Diagnoses and all orders for this visit:  Encounter for Department of Transportation (DOT) examination for driving license renewal -     Urinalysis  Well adult exam -     Lipid panel -     CMP14+EGFR -     CBC with Differential/Platelet       I am having Youlanda Roys. Scarantino maintain his acetaminophen, vitamin B-12, fluticasone, loratadine, aspirin EC, polyethylene glycol, atorvastatin, sucralfate, albuterol, FeroSul, carvedilol, hydroxyurea, pantoprazole, Stiolto Respimat, clonazePAM, and lisinopril.   Allergies as of 02/08/2019   No Known Allergies     Medication List       Accurate as of February 08, 2019  2:50 PM. If you have any questions, ask your nurse or doctor.        acetaminophen 500 MG tablet Commonly known as: TYLENOL Take 500 mg by mouth daily as needed for headache (pain).   albuterol 108 (90 Base) MCG/ACT inhaler Commonly known as: ProAir HFA Inhale 2 puffs into the lungs every 6 (six) hours as needed for wheezing or shortness of breath.   aspirin EC 81 MG tablet Take 81 mg by mouth at bedtime.   atorvastatin 80 MG tablet Commonly known as: LIPITOR Take 1 tablet (80 mg total) by mouth at bedtime.   carvedilol 6.25 MG tablet Commonly known as: COREG TAKE 1 TABLET 2 TIMES A DAY WITH A MEAL   clonazePAM 0.5 MG tablet Commonly known as: KLONOPIN Take 1 tablet (0.5 mg total) by mouth daily.   FeroSul 325 (65 FE) MG tablet Generic drug: ferrous sulfate TAKE 1 TABLET DAILY   fluticasone 50 MCG/ACT nasal spray Commonly known as: FLONASE Place 2 sprays into both nostrils daily as needed (sinus headache).   hydroxyurea 500 MG capsule Commonly known as: HYDREA Take 1 capsule (500 mg total) by mouth daily. May take with food to minimize GI side effects.   lisinopril 20 MG tablet Commonly known as: ZESTRIL Take 1 tablet (20 mg total) by mouth daily. What changed: See the new instructions. Changed by: Kate Sable, MD   loratadine 10 MG tablet Commonly known as: CLARITIN Take 10 mg by mouth daily as needed (seasonall allergies).   pantoprazole 40 MG tablet Commonly known as: PROTONIX TAKE  (1)  TABLET TWICE A DAY.   polyethylene glycol 17 g packet Commonly known as: MIRALAX / GLYCOLAX Take 17 g by mouth daily.   Stiolto Respimat 2.5-2.5 MCG/ACT Aers Generic drug: Tiotropium Bromide-Olodaterol Inhale 2 puffs into the lungs daily.   sucralfate 1 g tablet Commonly known as: CARAFATE Take 1 tablet (1 g total) by mouth 3 (three) times daily  before meals.   vitamin B-12 1000 MCG tablet Commonly known as: CYANOCOBALAMIN Take 1,000 mcg by mouth daily.        Follow-up: Return in about 1 year (around 02/08/2020).  Claretta Fraise, M.D.

## 2019-02-08 NOTE — Progress Notes (Signed)
Virtual Visit via Telephone Note   This visit type was conducted due to national recommendations for restrictions regarding the COVID-19 Pandemic (e.g. social distancing) in an effort to limit this patient's exposure and mitigate transmission in our community.  Due to his co-morbid illnesses, this patient is at least at moderate risk for complications without adequate follow up.  This format is felt to be most appropriate for this patient at this time.  The patient did not have access to video technology/had technical difficulties with video requiring transitioning to audio format only (telephone).  All issues noted in this document were discussed and addressed.  No physical exam could be performed with this format.  Please refer to the patient's chart for his  consent to telehealth for [facility].   Date:  02/08/2019   ID:  Jonathan Rosales, DOB 11/06/1950, MRN [id number]  Patient Location: Home Provider Location: Office  PCP:  [name], PA-C  Cardiologist:  Kate Sable, MD  Electrophysiologist:  None   Evaluation Performed:  Follow-Up Visit  Chief Complaint:  CAD  History of Present Illness:    Jonathan Rosales is a 68 y.o. 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  Echocardiogram in June 2019 demonstrated mildly reduced LV systolic function, EF 45 to [id number], grade 1 diastolic dysfunction, and mild to moderate aortic regurgitation.  He has chronic low back pain.  He denies chest pain and palpitations.  The patient does not have symptoms concerning for COVID-19 infection (fever, chills, cough, or new shortness of breath).  Social history: He is a Pharmacist, community.  He is married.   Past Medical History:  Diagnosis Date  . Allergy   .  Anemia   . Anxiety   . Blood transfusion without reported diagnosis   . Cataract   . Chronic kidney disease   . COPD (chronic obstructive pulmonary disease) ([place])   . Coronary artery disease   . Emphysema of lung ([place])   . Hypertension    Past Surgical History:  Procedure Laterality Date  . CARDIAC SURGERY    . CORONARY ARTERY BYPASS GRAFT       Current Meds  Medication Sig  . acetaminophen (TYLENOL) 500 MG tablet Take 500 mg by mouth daily as needed for headache (pain).   Marland Kitchen albuterol (PROAIR HFA) 108 (90 Base) MCG/ACT inhaler Inhale 2 puffs into the lungs every 6 (six) hours as needed for wheezing or shortness of breath.  Marland Kitchen aspirin EC 81 MG tablet Take 81 mg by mouth at bedtime.   Marland Kitchen atorvastatin (LIPITOR) 80 MG tablet Take 1 tablet (80 mg total) by mouth at bedtime.  . carvedilol (COREG) 6.25 MG tablet TAKE 1 TABLET 2 TIMES A DAY WITH A MEAL  . clonazePAM (KLONOPIN) 0.5 MG tablet Take 1 tablet (0.5 mg total) by mouth daily.  . FEROSUL 325 (65 Fe) MG tablet TAKE 1 TABLET DAILY  . fluticasone (FLONASE) 50 MCG/ACT nasal spray Place 2 sprays into both nostrils daily as needed (sinus headache).   . hydroxyurea (HYDREA) 500 MG capsule Take 1 capsule (500 mg total) by mouth daily. May take with food to minimize GI side effects.  Marland Kitchen lisinopril (ZESTRIL) 20 MG tablet TAKE (2) TABLETS DAILY  . loratadine (CLARITIN) 10 MG tablet Take 10 mg by  mouth daily as needed (seasonall allergies).   . pantoprazole (PROTONIX) 40 MG tablet TAKE  (1)  TABLET TWICE A DAY.  Marland Kitchen polyethylene glycol (MIRALAX / GLYCOLAX) packet Take 17 g by mouth daily.  Marland Kitchen STIOLTO RESPIMAT 2.5-2.5 MCG/ACT AERS Inhale 2 puffs into the lungs daily.  . sucralfate (CARAFATE) 1 g tablet Take 1 tablet (1 g total) by mouth 3 (three) times daily before meals.  . vitamin B-12 (CYANOCOBALAMIN) 1000 MCG tablet Take 1,000 mcg by mouth daily.     Allergies:   Patient has no known allergies.   Social History   Tobacco Use  . Smoking  status: Current Every Day Smoker    Packs/day: 1.00    Types: Cigarettes  . Smokeless tobacco: Never Used  Substance Use Topics  . Alcohol use: No    Frequency: Never  . Drug use: No     Family Hx: The patient's family history includes Alcohol abuse in his brother; COPD in his mother; Depression in his mother; Diabetes in his mother; Heart disease in his father; Kidney disease in his mother.  ROS:   Please see the history of present illness.     All other systems reviewed and are negative.   Prior CV studies:   The following studies were reviewed today:  Nuclear stress test 01/18/2019:   Defect 1: There is a medium defect of moderate severity present in the mid inferoseptal, mid inferior, mid inferolateral and apical inferior location.  Findings consistent with prior myocardial infarction with a mild degree of peri-infarct ischemia.  This is a low to intermediate risk study.  Nuclear stress EF: 56%.  Diffuse nonspecific ST segment depressions seen throughout study.     Labs/Other Tests and Data Reviewed:    EKG:  No ECG reviewed.  Recent Labs: 12/14/2018: Hemoglobin 14.3; Platelet Count 392   Recent Lipid Panel Lab Results  Component Value Date/Time   CHOL 121 11/17/2017 10:27 AM   TRIG 65 11/17/2017 10:27 AM   HDL 41 11/17/2017 10:27 AM   CHOLHDL 3.0 11/17/2017 10:27 AM   LDLCALC 67 11/17/2017 10:27 AM    Wt Readings from Last 3 Encounters:  02/08/19 129 lb (58.5 kg)  12/14/18 129 lb 1.6 oz (58.6 kg)  05/18/18 129 lb 3.2 oz (58.6 kg)     Objective:    Vital Signs:  BP 108/75   Pulse 90   Ht 5\' 8"  (1.727 m)   Wt 129 lb (58.5 kg)   BMI 19.61 kg/m    VITAL SIGNS:  reviewed  ASSESSMENT & PLAN:    1.  Coronary disease with history of three-vessel CABG: Symptomatically stable.   Currently on aspirin, atorvastatin 80 mg, carvedilol, and lisinopril ( I will reduce lisinopril to 20 mg daily as he is feeling slightly sluggish).    Nuclear stress test on  01/18/2019 demonstrated prior MI with mild degree of peri-infarct ischemia.  EF 56%. He needs tobacco cessation.  2.  Hypertension: Blood pressure is low normal.  I will reduce lisinopril to 20 mg daily as he is feeling slightly sluggish.  3.  Hyperlipidemia: I will obtain a copy of lipids from PCP to be checked today.  Continue atorvastatin 80 mg.  4.  Tobacco abuse: Cessation counseling previously provided. He smokes about 1 ppd.  5. CKD stage III:  BUN 22, creatinine 1.25 in July 2020.  Followed by nephrology.    COVID-19 Education: The signs and symptoms of COVID-19 were discussed with the patient and how to  seek care for testing (follow up with PCP or arrange E-visit).  The importance of social distancing was discussed today.  Time:   Today, I have spent 5 minutes with the patient with telehealth technology discussing the above problems.     Medication Adjustments/Labs and Tests Ordered: Current medicines are reviewed at length with the patient today.  Concerns regarding medicines are outlined above.   Tests Ordered: No orders of the defined types were placed in this encounter.   Medication Changes: No orders of the defined types were placed in this encounter.   Follow Up:  Virtual Visit or In Person in 1 year(s)  Signed, Kate Sable, MD  02/08/2019 11:45 AM    [place]

## 2019-02-08 NOTE — Addendum Note (Signed)
Addended by: Laurine Blazer on: 02/08/2019 12:01 PM   Modules accepted: Orders

## 2019-02-09 LAB — LIPID PANEL
Chol/HDL Ratio: 2.8 ratio (ref 0.0–5.0)
Cholesterol, Total: 130 mg/dL (ref 100–199)
HDL: 46 mg/dL (ref >39)
LDL Chol Calc (NIH): 67 mg/dL (ref 0–99)
Triglycerides: 87 mg/dL (ref 0–149)
VLDL Cholesterol Cal: 17 mg/dL (ref 5–40)

## 2019-02-09 LAB — CBC WITH DIFFERENTIAL/PLATELET
Basophils Absolute: 0.1 10*3/uL (ref 0.0–0.2)
Basos: 1 %
EOS (ABSOLUTE): 0.3 10*3/uL (ref 0.0–0.4)
Eos: 3 %
Hematocrit: 40.9 % (ref 37.5–51.0)
Hemoglobin: 14.8 g/dL (ref 13.0–17.7)
Immature Grans (Abs): 0 10*3/uL (ref 0.0–0.1)
Immature Granulocytes: 0 %
Lymphocytes Absolute: 2.3 10*3/uL (ref 0.7–3.1)
Lymphs: 22 %
MCH: 39.6 pg — ABNORMAL HIGH (ref 26.6–33.0)
MCHC: 36.2 g/dL — ABNORMAL HIGH (ref 31.5–35.7)
MCV: 109 fL — ABNORMAL HIGH (ref 79–97)
Monocytes Absolute: 0.7 10*3/uL (ref 0.1–0.9)
Monocytes: 7 %
Neutrophils Absolute: 7 10*3/uL (ref 1.4–7.0)
Neutrophils: 67 %
Platelets: 412 10*3/uL (ref 150–450)
RBC: 3.74 x10E6/uL — ABNORMAL LOW (ref 4.14–5.80)
RDW: 13.5 % (ref 11.6–15.4)
WBC: 10.4 10*3/uL (ref 3.4–10.8)

## 2019-02-09 LAB — CMP14+EGFR
ALT: 16 IU/L (ref 0–44)
AST: 13 IU/L (ref 0–40)
Albumin/Globulin Ratio: 2.1 (ref 1.2–2.2)
Albumin: 4.2 g/dL (ref 3.8–4.8)
Alkaline Phosphatase: 90 IU/L (ref 39–117)
BUN/Creatinine Ratio: 16 (ref 10–24)
BUN: 24 mg/dL (ref 8–27)
Bilirubin Total: 0.2 mg/dL (ref 0.0–1.2)
CO2: 25 mmol/L (ref 20–29)
Calcium: 9.4 mg/dL (ref 8.6–10.2)
Chloride: 103 mmol/L (ref 96–106)
Creatinine, Ser: 1.52 mg/dL — ABNORMAL HIGH (ref 0.76–1.27)
GFR calc Af Amer: 53 mL/min/{1.73_m2} — ABNORMAL LOW (ref >59)
GFR calc non Af Amer: 46 mL/min/{1.73_m2} — ABNORMAL LOW (ref >59)
Globulin, Total: 2 g/dL (ref 1.5–4.5)
Glucose: 87 mg/dL (ref 65–99)
Potassium: 4.9 mmol/L (ref 3.5–5.2)
Sodium: 143 mmol/L (ref 134–144)
Total Protein: 6.2 g/dL (ref 6.0–8.5)

## 2019-02-10 ENCOUNTER — Ambulatory Visit: Payer: Medicare Other

## 2019-02-13 ENCOUNTER — Other Ambulatory Visit: Payer: Self-pay | Admitting: Physician Assistant

## 2019-02-16 ENCOUNTER — Telehealth: Payer: Self-pay | Admitting: Cardiovascular Disease

## 2019-02-16 NOTE — Telephone Encounter (Signed)
Left message to return call 

## 2019-02-16 NOTE — Telephone Encounter (Signed)
Still having issues with BP after med changes

## 2019-02-17 NOTE — Telephone Encounter (Signed)
Jonathan Rosales returned call.  Asked that you call her back due to Jonathan Rosales working

## 2019-02-17 NOTE — Telephone Encounter (Signed)
Per Aileen Fass, patient is having problems with his BP dropping after taking his medications. Morning BP's before taking medications are: Today BP 133/95 HR 77, previous days-BP 139/95 HR 77 & BP 137/99 HR 80. After taking medications BP 106/74 HR 75; BP 111/83 HR 74; & BP 99/69 HR 78. Reports being weak and SOB when BP is low.  Medications reviewed and patient has been taking 40 mg lisinopril and has been taking (2) carvedilol (12.5 mg) at the same time. Advised that lisinopril should be 20 mg daily which was reduced on 02/08/2019 and advised that carvedilol should be takin 1 tablet twice daily of the 6.25 mg. Advised to continue monitoring BP's and take medications as prescribed and to contact our office if his symptoms didn't improve. Verbalized understanding of plan.

## 2019-02-22 ENCOUNTER — Ambulatory Visit: Payer: Medicare Other | Admitting: Physician Assistant

## 2019-02-25 ENCOUNTER — Other Ambulatory Visit: Payer: Self-pay | Admitting: Physician Assistant

## 2019-02-25 DIAGNOSIS — K219 Gastro-esophageal reflux disease without esophagitis: Secondary | ICD-10-CM

## 2019-03-07 ENCOUNTER — Other Ambulatory Visit: Payer: Self-pay | Admitting: Nurse Practitioner

## 2019-03-08 ENCOUNTER — Ambulatory Visit: Payer: Medicare Other | Admitting: Family Medicine

## 2019-03-17 ENCOUNTER — Other Ambulatory Visit: Payer: Self-pay | Admitting: Physician Assistant

## 2019-04-08 ENCOUNTER — Other Ambulatory Visit: Payer: Self-pay | Admitting: Nurse Practitioner

## 2019-04-08 ENCOUNTER — Other Ambulatory Visit: Payer: Self-pay | Admitting: Physician Assistant

## 2019-04-08 DIAGNOSIS — I2510 Atherosclerotic heart disease of native coronary artery without angina pectoris: Secondary | ICD-10-CM

## 2019-04-15 ENCOUNTER — Other Ambulatory Visit: Payer: Self-pay | Admitting: Physician Assistant

## 2019-04-15 DIAGNOSIS — K219 Gastro-esophageal reflux disease without esophagitis: Secondary | ICD-10-CM

## 2019-04-19 ENCOUNTER — Ambulatory Visit (INDEPENDENT_AMBULATORY_CARE_PROVIDER_SITE_OTHER): Payer: Medicare Other | Admitting: *Deleted

## 2019-04-19 DIAGNOSIS — Z0000 Encounter for general adult medical examination without abnormal findings: Secondary | ICD-10-CM

## 2019-04-19 NOTE — Patient Instructions (Signed)

## 2019-04-19 NOTE — Progress Notes (Addendum)
MEDICARE ANNUAL WELLNESS VISIT  04/19/2019  Telephone Visit Disclaimer This Medicare AWV was conducted by telephone due to national recommendations for restrictions regarding the COVID-19 Pandemic (e.g. social distancing).  I verified, using two identifiers, that I am speaking with Jonathan Rosales or their authorized healthcare agent. I discussed the limitations, risks, security, and privacy concerns of performing an evaluation and management service by telephone and the potential availability of an in-person appointment in the future. The patient expressed understanding and agreed to proceed.   Subjective:  Jonathan Rosales is a 68 y.o. male patient of Jonathan Sleeper, PA-C who had a Medicare Annual Wellness Visit today via telephone. [name] is Working full time and lives with their spouse. he has 0 children. he reports that he is socially active and does interact with friends/family regularly. he is minimally physically active and enjoys talking.  Patient Care Team: [name] as PCP - General (Physician Assistant) [name], MD as PCP - Cardiology (Cardiology)  Advanced Directives 04/19/2019 01/26/2018 06/02/2017  Does Patient Have a Medical Advance Directive? No No No  Would patient like information on creating a medical advance directive? No - Patient declined - No - Patient declined    Hospital Utilization Over the Past 12 Months: # of hospitalizations or ER visits: 0 # of surgeries: 0  Review of Systems    Patient reports that his overall health is unchanged compared to last year.  History obtained from chart review  Patient Reported Readings (BP, Pulse, CBG, Weight, etc) none  Pain Assessment Pain : No/denies pain     Current Medications & Allergies (verified) Allergies as of 04/19/2019   No Known Allergies     Medication List       Accurate as of April 19, 2019  3:23 PM. If you have any questions, ask your nurse or doctor.        STOP taking these medications   polyethylene glycol 17 g packet Commonly known as: MIRALAX / GLYCOLAX     TAKE these medications   acetaminophen 500 MG tablet Commonly known as: TYLENOL Take 500 mg by mouth daily as needed for headache (pain).   albuterol 108 (90 Base) MCG/ACT inhaler Commonly known as: ProAir HFA Inhale 2 puffs into the lungs every 6 (six) hours as needed for wheezing or shortness of breath.   aspirin EC 81 MG tablet Take 81 mg by mouth at bedtime.   atorvastatin 80 MG tablet Commonly known as: LIPITOR Take 1 tablet (80 mg total) by mouth at bedtime.   carvedilol 6.25 MG tablet Commonly known as: COREG TAKE 1 TABLET 2 TIMES A DAY WITH A MEAL   clonazePAM 0.5 MG tablet Commonly known as: KLONOPIN Take 1 tablet (0.5 mg total) by mouth daily.   FeroSul 325 (65 FE) MG tablet Generic drug: ferrous sulfate TAKE 1 TABLET DAILY   fluticasone 50 MCG/ACT nasal spray Commonly known as: FLONASE Place 2 sprays into both nostrils daily as needed (sinus headache).   hydroxyurea 500 MG capsule Commonly known as: HYDREA Take 1 capsule (500 mg total) by mouth daily. May take with food to minimize GI side effects.   lisinopril 20 MG tablet Commonly known as: ZESTRIL Take 1 tablet (20 mg total) by mouth daily.   loratadine 10 MG tablet Commonly known as: CLARITIN Take 10 mg by mouth daily as needed (seasonall allergies).   pantoprazole 40 MG tablet Commonly known as: PROTONIX TAKE  (1)  TABLET  TWICE A DAY.   Stiolto Respimat 2.5-2.5 MCG/ACT Aers Generic drug: Tiotropium Bromide-Olodaterol Inhale 2 puffs into the lungs daily.   sucralfate 1 g tablet Commonly known as: CARAFATE Take 1 tablet (1 g total) by mouth 3 (three) times daily before meals.   vitamin B-12 1000 MCG tablet Commonly known as: CYANOCOBALAMIN Take 1,000 mcg by mouth daily.       History (reviewed): Past Medical History:  Diagnosis Date  . Allergy   . Anemia   . Anxiety   .  Blood transfusion without reported diagnosis   . Cataract   . Chronic kidney disease   . COPD (chronic obstructive pulmonary disease) ([place])   . Coronary artery disease   . Emphysema of lung ([place])   . Hypertension    Past Surgical History:  Procedure Laterality Date  . CARDIAC SURGERY    . CORONARY ARTERY BYPASS GRAFT    . REPAIR OF PERFORATED ULCER  2017   Family History  Problem Relation Age of Onset  . COPD Mother   . Depression Mother   . Diabetes Mother   . Kidney disease Mother   . Heart disease Father   . Alcohol abuse Brother    Social History   Socioeconomic History  . Marital status: Married    Spouse name: Artemis  . Number of children: 0  . Years of education: 66  . Highest education level: High school graduate  Occupational History    Employer: [employer]  Tobacco Use  . Smoking status: Current Every Day Smoker    Packs/day: 1.00    Types: Cigarettes  . Smokeless tobacco: Never Used  Substance and Sexual Activity  . Alcohol use: No  . Drug use: No  . Sexual activity: Not Currently  Other Topics Concern  . Not on file  Social History Narrative  . Not on file   Social Determinants of Health   Financial Resource Strain: Low Risk   . Difficulty of Paying Living Expenses: Not hard at all  Food Insecurity: No Food Insecurity  . Worried About Charity fundraiser in the Last Year: Never true  . Ran Out of Food in the Last Year: Never true  Transportation Needs: No Transportation Needs  . Lack of Transportation (Medical): No  . Lack of Transportation (Non-Medical): No  Physical Activity: Inactive  . Days of Exercise per Week: 0 days  . Minutes of Exercise per Session: 0 min  Stress: No Stress Concern Present  . Feeling of Stress : Not at all  Social Connections: Not Isolated  . Frequency of Communication with Friends and Family: More than three times a week  . Frequency of Social Gatherings with Friends and Family: More than three times a week  .  Attends Religious Services: More than 4 times per year  . Active Member of Clubs or Organizations: Yes  . Attends Archivist Meetings: More than 4 times per year  . Marital Status: Married    Activities of Daily Living In your present state of health, do you have any difficulty performing the following activities: 04/19/2019  Hearing? Y  Comment wears a hearing aid in the left ear  Vision? Y  Comment has to have cataracts removed next month  Difficulty concentrating or making decisions? N  Walking or climbing stairs? N  Dressing or bathing? N  Doing errands, shopping? N  Preparing Food and eating ? N  Using the Toilet? N  In the past six months, have  you accidently leaked urine? N  Do you have problems with loss of bowel control? N  Managing your Medications? N  Managing your Finances? N  Housekeeping or managing your Housekeeping? N  Some recent data might be hidden    Patient Education/ Literacy How often do you need to have someone help you when you read instructions, pamphlets, or other written materials from your doctor or pharmacy?: 1 - Never What is the last grade level you completed in school?: 12th grade  Exercise Current Exercise Habits: The patient does not participate in regular exercise at present, Exercise limited by: cardiac condition(s);respiratory conditions(s)  Diet Patient reports consuming 3 meals a day and 0 snack(s) a day Patient reports that his primary diet is: Regular Patient reports that she does have regular access to food.   Depression Screen PHQ 2/9 Scores 04/19/2019 02/08/2019 05/18/2018 02/23/2018 11/17/2017  PHQ - 2 Score 0 0 0 0 0     Fall Risk Fall Risk  04/19/2019 02/08/2019 05/18/2018 02/23/2018 11/17/2017  Falls in the past year? 0 0 0 No No     Objective:  HUSNAIN ZAHNOW seemed alert and oriented and he participated appropriately during our telephone visit.  Blood Pressure Weight BMI  BP Readings from Last 3 Encounters:   02/08/19 133/86  02/08/19 108/75  12/14/18 127/84   Wt Readings from Last 3 Encounters:  02/08/19 128 lb (58.1 kg)  02/08/19 129 lb (58.5 kg)  12/14/18 129 lb 1.6 oz (58.6 kg)   BMI Readings from Last 1 Encounters:  02/08/19 19.46 kg/m    *Unable to obtain current vital signs, weight, and BMI due to telephone visit type  Hearing/Vision  . Amy did not seem to have difficulty with hearing/understanding during the telephone conversation . Reports that he has not had a formal eye exam by an eye care professional within the past year . Reports that he has not had a formal hearing evaluation within the past year *Unable to fully assess hearing and vision during telephone visit type  Cognitive Function: 6CIT Screen 04/19/2019  What Year? 0 points  What month? 0 points  What time? 0 points  Count back from 20 0 points  Months in reverse 0 points  Repeat phrase 0 points  Total Score 0   (Normal:0-7, Significant for Dysfunction: >8)  Normal Cognitive Function Screening: Yes   Immunization & Health Maintenance Record Immunization History  Administered Date(s) Administered  . Pneumococcal Conjugate-13 03/11/2016  . Pneumococcal Polysaccharide-23 03/15/2014  . Tdap 11/28/2014    Health Maintenance  Topic Date Due  . Hepatitis C Screening  August 23, 1950  . COLONOSCOPY  08/10/1999  . INFLUENZA VACCINE  12/05/2018  . PNA vac Low Risk Adult (2 of 2 - PPSV23) 03/16/2019  . TETANUS/TDAP  11/27/2024       Assessment  This is a routine wellness examination for MOXON BEHNEY.  Health Maintenance: Due or Overdue Health Maintenance Due  Topic Date Due  . Hepatitis C Screening  [birth year]  . COLONOSCOPY  08/10/1999  . INFLUENZA VACCINE  12/05/2018  . PNA vac Low Risk Adult (2 of 2 - PPSV23) 03/16/2019    Jonathan Rosales does not need a referral for Community Assistance: Care Management:   no Social Work:    no Prescription Assistance:  no Nutrition/Diabetes  Education:  no   Plan:  Personalized Goals Goals Addressed            This Visit's Progress   . DIET - INCREASE  WATER INTAKE       Try to drink 6-8 glasses of water daily.      Personalized Health Maintenance & Screening Recommendations  Influenza vaccine Colorectal cancer screening Shingles vaccine  Lung Cancer Screening Recommended: yes-pt declines at this time (Low Dose CT Chest recommended if Age 35-80 years, 30 pack-year currently smoking OR have quit w/in past 15 years) Hepatitis C Screening recommended: yes-will offer at next visit with PCP HIV Screening recommended: no  Advanced Directives: Written information was not prepared per patient's request.  Referrals & Orders No orders of the defined types were placed in this encounter.   Follow-up Plan . Follow-up with Jonathan Sleeper, PA-C as planned . Keep your appt with GI for colonoscopy . Consider Flu and Shingles vaccines at your next visit with your PCP   I have personally reviewed and noted the following in the patient's chart:   . Medical and social history . Use of alcohol, tobacco or illicit drugs  . Current medications and supplements . Functional ability and status . Nutritional status . Physical activity . Advanced directives . List of other physicians . Hospitalizations, surgeries, and ER visits in previous 12 months . Vitals . Screenings to include cognitive, depression, and falls . Referrals and appointments  In addition, I have reviewed and discussed with Jonathan Rosales certain preventive protocols, quality metrics, and best practice recommendations. A written personalized care plan for preventive services as well as general preventive health recommendations is available and can be mailed to the patient at his request.      Milas Hock, LPN  [id number]   I have reviewed and agree with the above AWV documentation.   Jonathan Sleeper PA-C [facility] [address]  [place], [place] [address] [phone]

## 2019-04-22 ENCOUNTER — Other Ambulatory Visit: Payer: Self-pay | Admitting: Physician Assistant

## 2019-05-08 ENCOUNTER — Other Ambulatory Visit: Payer: Self-pay | Admitting: Nurse Practitioner

## 2019-05-08 ENCOUNTER — Other Ambulatory Visit: Payer: Self-pay | Admitting: Physician Assistant

## 2019-05-08 DIAGNOSIS — K219 Gastro-esophageal reflux disease without esophagitis: Secondary | ICD-10-CM

## 2019-05-12 ENCOUNTER — Other Ambulatory Visit: Payer: Self-pay | Admitting: Physician Assistant

## 2019-05-12 DIAGNOSIS — I2510 Atherosclerotic heart disease of native coronary artery without angina pectoris: Secondary | ICD-10-CM

## 2019-06-04 ENCOUNTER — Other Ambulatory Visit: Payer: Self-pay | Admitting: Physician Assistant

## 2019-06-04 ENCOUNTER — Other Ambulatory Visit: Payer: Self-pay | Admitting: Nurse Practitioner

## 2019-06-04 DIAGNOSIS — I2510 Atherosclerotic heart disease of native coronary artery without angina pectoris: Secondary | ICD-10-CM

## 2019-06-04 DIAGNOSIS — K219 Gastro-esophageal reflux disease without esophagitis: Secondary | ICD-10-CM

## 2019-06-07 ENCOUNTER — Other Ambulatory Visit: Payer: Self-pay | Admitting: Physician Assistant

## 2019-06-18 ENCOUNTER — Other Ambulatory Visit: Payer: Self-pay | Admitting: Physician Assistant

## 2019-06-18 DIAGNOSIS — I2510 Atherosclerotic heart disease of native coronary artery without angina pectoris: Secondary | ICD-10-CM

## 2019-07-05 DIAGNOSIS — M5137 Other intervertebral disc degeneration, lumbosacral region: Secondary | ICD-10-CM | POA: Diagnosis not present

## 2019-07-05 DIAGNOSIS — M9903 Segmental and somatic dysfunction of lumbar region: Secondary | ICD-10-CM | POA: Diagnosis not present

## 2019-07-05 DIAGNOSIS — M9902 Segmental and somatic dysfunction of thoracic region: Secondary | ICD-10-CM | POA: Diagnosis not present

## 2019-07-05 DIAGNOSIS — M9901 Segmental and somatic dysfunction of cervical region: Secondary | ICD-10-CM | POA: Diagnosis not present

## 2019-07-09 ENCOUNTER — Other Ambulatory Visit: Payer: Self-pay | Admitting: Physician Assistant

## 2019-07-09 ENCOUNTER — Other Ambulatory Visit: Payer: Self-pay | Admitting: Nurse Practitioner

## 2019-07-09 DIAGNOSIS — K219 Gastro-esophageal reflux disease without esophagitis: Secondary | ICD-10-CM

## 2019-07-12 ENCOUNTER — Other Ambulatory Visit: Payer: Self-pay | Admitting: Physician Assistant

## 2019-07-12 DIAGNOSIS — M542 Cervicalgia: Secondary | ICD-10-CM | POA: Diagnosis not present

## 2019-07-12 DIAGNOSIS — M5137 Other intervertebral disc degeneration, lumbosacral region: Secondary | ICD-10-CM | POA: Diagnosis not present

## 2019-07-12 DIAGNOSIS — M9902 Segmental and somatic dysfunction of thoracic region: Secondary | ICD-10-CM | POA: Diagnosis not present

## 2019-07-12 DIAGNOSIS — M9903 Segmental and somatic dysfunction of lumbar region: Secondary | ICD-10-CM | POA: Diagnosis not present

## 2019-07-12 DIAGNOSIS — M65332 Trigger finger, left middle finger: Secondary | ICD-10-CM | POA: Diagnosis not present

## 2019-07-12 DIAGNOSIS — M9901 Segmental and somatic dysfunction of cervical region: Secondary | ICD-10-CM | POA: Diagnosis not present

## 2019-07-19 DIAGNOSIS — Z87891 Personal history of nicotine dependence: Secondary | ICD-10-CM | POA: Diagnosis not present

## 2019-07-23 ENCOUNTER — Other Ambulatory Visit: Payer: Self-pay | Admitting: Physician Assistant

## 2019-07-23 DIAGNOSIS — F419 Anxiety disorder, unspecified: Secondary | ICD-10-CM

## 2019-07-23 NOTE — Telephone Encounter (Signed)
Patient last seen 02/03/2019. Patient needs an appointment.

## 2019-07-23 NOTE — Telephone Encounter (Signed)
Lmtcb to schedule appointment. ?

## 2019-07-26 ENCOUNTER — Encounter: Payer: Self-pay | Admitting: Family Medicine

## 2019-07-26 ENCOUNTER — Telehealth (INDEPENDENT_AMBULATORY_CARE_PROVIDER_SITE_OTHER): Payer: Medicare Other | Admitting: Family Medicine

## 2019-07-26 DIAGNOSIS — M5137 Other intervertebral disc degeneration, lumbosacral region: Secondary | ICD-10-CM | POA: Diagnosis not present

## 2019-07-26 DIAGNOSIS — M9901 Segmental and somatic dysfunction of cervical region: Secondary | ICD-10-CM | POA: Diagnosis not present

## 2019-07-26 DIAGNOSIS — M9902 Segmental and somatic dysfunction of thoracic region: Secondary | ICD-10-CM | POA: Diagnosis not present

## 2019-07-26 DIAGNOSIS — M9903 Segmental and somatic dysfunction of lumbar region: Secondary | ICD-10-CM | POA: Diagnosis not present

## 2019-07-26 DIAGNOSIS — F419 Anxiety disorder, unspecified: Secondary | ICD-10-CM

## 2019-07-26 MED ORDER — CLONAZEPAM 0.5 MG PO TABS: 0.2500 mg | ORAL_TABLET | Freq: Every day | ORAL | 0 refills | Status: DC | PRN

## 2019-07-26 NOTE — Progress Notes (Signed)
    Subjective:    Patient ID: Jonathan Rosales, male    DOB: 08/12/1950, 69 y.o.   MRN: [id number]   HPI: Jonathan Rosales is a 69 y.o. male presenting for anxiety medicine refill. Clonazepam 1/2 tab 2-3 times a week. Feels uptight and nervous. Job stress is the cause. Does not use it when driving. Never takes a whole pill. I could not locate a CS contract.  PDMP shows that he is using thirty tablets every 6-8 weeks   Depression screen [facility] 2/9 04/19/2019 02/08/2019 05/18/2018 02/23/2018 11/17/2017  Decreased Interest 0 0 0 0 0  Down, Depressed, Hopeless 0 0 0 0 0  PHQ - 2 Score 0 0 0 0 0     Relevant past medical, surgical, family and social history reviewed and updated as indicated.  Interim medical history since our last visit reviewed. Allergies and medications reviewed and updated.  ROS:  Review of Systems  Constitutional: Negative for fever.  Respiratory: Negative for shortness of breath.   Cardiovascular: Negative for chest pain.  Musculoskeletal: Negative for arthralgias.  Skin: Negative for rash.     Social History   Tobacco Use  Smoking Status Current Every Day Smoker  . Packs/day: 1.00  . Types: Cigarettes  Smokeless Tobacco Never Used       Objective:     Wt Readings from Last 3 Encounters:  02/08/19 128 lb (58.1 kg)  02/08/19 129 lb (58.5 kg)  12/14/18 129 lb 1.6 oz (58.6 kg)     Exam deferred. Pt. Harboring due to COVID 19. Phone visit performed. Attempted video but pt. Could not connect.  Assessment & Plan:   1. Anxiety     Meds ordered this encounter  Medications  . clonazePAM (KLONOPIN) 0.5 MG tablet    Sig: Take 0.5 tablets (0.25 mg total) by mouth daily as needed for anxiety.    Dispense:  45 tablet    Refill:  0   Pt. Seems to be using the medication in an appropriate manner according to the prescribed method. This is consistent throughout the two years of his PDMP report. However, there is no evidence of a CS contract so I have not  given refills. He will need to be seen to sign contract for next refill. Will also consider alternatives such as lexapro or cymbalta at that time.       Virtual Visit via telephone Note  I discussed the limitations, risks, security and privacy concerns of performing an evaluation and management service by telephone and the availability of in person appointments. The patient was identified with two identifiers. Pt.expressed understanding and agreed to proceed. Pt. Is at home. Dr. Livia Snellen is in his office.  Follow Up Instructions:   I discussed the assessment and treatment plan with the patient. The patient was provided an opportunity to ask questions and all were answered. The patient agreed with the plan and demonstrated an understanding of the instructions.   The patient was advised to call back or seek an in-person evaluation if the symptoms worsen or if the condition fails to improve as anticipated.   Total minutes including chart review and phone contact time: 19   Follow up plan: No follow-ups on file.  Claretta Fraise, MD [place]

## 2019-08-09 ENCOUNTER — Other Ambulatory Visit: Payer: Self-pay | Admitting: *Deleted

## 2019-08-09 MED ORDER — STIOLTO RESPIMAT 2.5-2.5 MCG/ACT IN AERS: 2.0000 | INHALATION_SPRAY | Freq: Every day | RESPIRATORY_TRACT | 5 refills | Status: DC

## 2019-08-16 DIAGNOSIS — M9903 Segmental and somatic dysfunction of lumbar region: Secondary | ICD-10-CM | POA: Diagnosis not present

## 2019-08-16 DIAGNOSIS — M9901 Segmental and somatic dysfunction of cervical region: Secondary | ICD-10-CM | POA: Diagnosis not present

## 2019-08-16 DIAGNOSIS — M9902 Segmental and somatic dysfunction of thoracic region: Secondary | ICD-10-CM | POA: Diagnosis not present

## 2019-08-16 DIAGNOSIS — M5137 Other intervertebral disc degeneration, lumbosacral region: Secondary | ICD-10-CM | POA: Diagnosis not present

## 2019-08-23 DIAGNOSIS — R109 Unspecified abdominal pain: Secondary | ICD-10-CM | POA: Diagnosis not present

## 2019-08-23 DIAGNOSIS — Z1211 Encounter for screening for malignant neoplasm of colon: Secondary | ICD-10-CM | POA: Diagnosis not present

## 2019-08-23 DIAGNOSIS — Z8719 Personal history of other diseases of the digestive system: Secondary | ICD-10-CM | POA: Diagnosis not present

## 2019-08-23 DIAGNOSIS — R718 Other abnormality of red blood cells: Secondary | ICD-10-CM | POA: Diagnosis not present

## 2019-08-23 DIAGNOSIS — Z8619 Personal history of other infectious and parasitic diseases: Secondary | ICD-10-CM | POA: Diagnosis not present

## 2019-08-25 ENCOUNTER — Other Ambulatory Visit: Payer: Self-pay | Admitting: *Deleted

## 2019-08-25 DIAGNOSIS — K219 Gastro-esophageal reflux disease without esophagitis: Secondary | ICD-10-CM

## 2019-08-25 MED ORDER — PANTOPRAZOLE SODIUM 40 MG PO TBEC: DELAYED_RELEASE_TABLET | ORAL | 0 refills | Status: DC

## 2019-08-30 DIAGNOSIS — Z87891 Personal history of nicotine dependence: Secondary | ICD-10-CM | POA: Diagnosis not present

## 2019-08-30 DIAGNOSIS — R911 Solitary pulmonary nodule: Secondary | ICD-10-CM | POA: Diagnosis not present

## 2019-08-30 DIAGNOSIS — J449 Chronic obstructive pulmonary disease, unspecified: Secondary | ICD-10-CM | POA: Diagnosis not present

## 2019-08-31 ENCOUNTER — Other Ambulatory Visit: Payer: Self-pay | Admitting: Nurse Practitioner

## 2019-09-06 DIAGNOSIS — M9901 Segmental and somatic dysfunction of cervical region: Secondary | ICD-10-CM | POA: Diagnosis not present

## 2019-09-06 DIAGNOSIS — M9903 Segmental and somatic dysfunction of lumbar region: Secondary | ICD-10-CM | POA: Diagnosis not present

## 2019-09-06 DIAGNOSIS — M9902 Segmental and somatic dysfunction of thoracic region: Secondary | ICD-10-CM | POA: Diagnosis not present

## 2019-09-06 DIAGNOSIS — M5137 Other intervertebral disc degeneration, lumbosacral region: Secondary | ICD-10-CM | POA: Diagnosis not present

## 2019-09-10 ENCOUNTER — Telehealth: Payer: Self-pay | Admitting: *Deleted

## 2019-09-10 DIAGNOSIS — I2510 Atherosclerotic heart disease of native coronary artery without angina pectoris: Secondary | ICD-10-CM

## 2019-09-10 MED ORDER — CARVEDILOL 6.25 MG PO TABS: ORAL_TABLET | ORAL | 0 refills | Status: DC

## 2019-09-10 NOTE — Telephone Encounter (Signed)
Patient has a follow up appointment scheduled. 

## 2019-09-13 DIAGNOSIS — M9903 Segmental and somatic dysfunction of lumbar region: Secondary | ICD-10-CM | POA: Diagnosis not present

## 2019-09-13 DIAGNOSIS — M5137 Other intervertebral disc degeneration, lumbosacral region: Secondary | ICD-10-CM | POA: Diagnosis not present

## 2019-09-13 DIAGNOSIS — M9902 Segmental and somatic dysfunction of thoracic region: Secondary | ICD-10-CM | POA: Diagnosis not present

## 2019-09-13 DIAGNOSIS — M9901 Segmental and somatic dysfunction of cervical region: Secondary | ICD-10-CM | POA: Diagnosis not present

## 2019-09-20 ENCOUNTER — Other Ambulatory Visit: Payer: Self-pay | Admitting: *Deleted

## 2019-09-20 DIAGNOSIS — M9901 Segmental and somatic dysfunction of cervical region: Secondary | ICD-10-CM | POA: Diagnosis not present

## 2019-09-20 DIAGNOSIS — M9903 Segmental and somatic dysfunction of lumbar region: Secondary | ICD-10-CM | POA: Diagnosis not present

## 2019-09-20 DIAGNOSIS — M5137 Other intervertebral disc degeneration, lumbosacral region: Secondary | ICD-10-CM | POA: Diagnosis not present

## 2019-09-20 DIAGNOSIS — M9902 Segmental and somatic dysfunction of thoracic region: Secondary | ICD-10-CM | POA: Diagnosis not present

## 2019-09-20 MED ORDER — ALBUTEROL SULFATE HFA 108 (90 BASE) MCG/ACT IN AERS: INHALATION_SPRAY | RESPIRATORY_TRACT | 0 refills | Status: DC

## 2019-10-06 ENCOUNTER — Other Ambulatory Visit: Payer: Self-pay | Admitting: Nurse Practitioner

## 2019-10-06 DIAGNOSIS — I2510 Atherosclerotic heart disease of native coronary artery without angina pectoris: Secondary | ICD-10-CM

## 2019-10-08 ENCOUNTER — Telehealth: Payer: Self-pay | Admitting: Family Medicine

## 2019-10-08 ENCOUNTER — Other Ambulatory Visit: Payer: Self-pay | Admitting: Family Medicine

## 2019-10-08 DIAGNOSIS — I2510 Atherosclerotic heart disease of native coronary artery without angina pectoris: Secondary | ICD-10-CM

## 2019-10-08 MED ORDER — CARVEDILOL 6.25 MG PO TABS: ORAL_TABLET | ORAL | 0 refills | Status: DC

## 2019-10-08 NOTE — Telephone Encounter (Signed)
  Prescription Request  10/08/2019  What is the name of the medication or equipment?ferrous sulfate (FEROSUL) 325 (65 FE) MG tablet    Have you contacted your pharmacy to request a refill? (if applicable) yes  Which pharmacy would you like this sent to? [facility]   Patient notified that their request is being sent to the clinical staff for review and that they should receive a response within 2 business days.

## 2019-10-08 NOTE — Telephone Encounter (Signed)
Patient had a video visit with Dr. Livia Snellen in March.  Has not had an office visit with new pcp, Ronnald Ramp patient).  Please refill script if appropriate .

## 2019-10-08 NOTE — Telephone Encounter (Signed)
  Prescription Request  10/08/2019  What is the name of the medication or equipment? carvedilol (COREG) 6.25 MG tablet   Have you contacted your pharmacy to request a refill? (if applicable) yes  Which pharmacy would you like this sent to? [facility]   Patient notified that their request is being sent to the clinical staff for review and that they should receive a response within 2 business days.

## 2019-10-08 NOTE — Telephone Encounter (Signed)
Done. Thanks, WS 

## 2019-10-08 NOTE — Telephone Encounter (Signed)
Patient aware, script is ready. 

## 2019-10-11 ENCOUNTER — Ambulatory Visit: Payer: Medicare Other | Admitting: Family Medicine

## 2019-10-11 ENCOUNTER — Telehealth: Payer: Self-pay | Admitting: Family Medicine

## 2019-10-11 DIAGNOSIS — M9902 Segmental and somatic dysfunction of thoracic region: Secondary | ICD-10-CM | POA: Diagnosis not present

## 2019-10-11 DIAGNOSIS — M9903 Segmental and somatic dysfunction of lumbar region: Secondary | ICD-10-CM | POA: Diagnosis not present

## 2019-10-11 DIAGNOSIS — M5137 Other intervertebral disc degeneration, lumbosacral region: Secondary | ICD-10-CM | POA: Diagnosis not present

## 2019-10-11 DIAGNOSIS — M9901 Segmental and somatic dysfunction of cervical region: Secondary | ICD-10-CM | POA: Diagnosis not present

## 2019-10-11 MED ORDER — FERROUS SULFATE 325 (65 FE) MG PO TABS: 325.0000 mg | ORAL_TABLET | Freq: Every day | ORAL | 0 refills | Status: DC

## 2019-10-11 NOTE — Telephone Encounter (Signed)
  Prescription Request  10/11/2019  What is the name of the medication or equipment? ferrous sulfate (FEROSUL) 325 (65 FE) MG tablet  Have you contacted your pharmacy to request a refill? (if applicable) no--pt out  Which pharmacy would you like this sent to? [facility]    Patient notified that their request is being sent to the clinical staff for review and that they should receive a response within 2 business days.

## 2019-10-11 NOTE — Telephone Encounter (Signed)
One month given, pt aware. Has appt 10/25/19

## 2019-10-18 DIAGNOSIS — M5137 Other intervertebral disc degeneration, lumbosacral region: Secondary | ICD-10-CM | POA: Diagnosis not present

## 2019-10-18 DIAGNOSIS — M9902 Segmental and somatic dysfunction of thoracic region: Secondary | ICD-10-CM | POA: Diagnosis not present

## 2019-10-18 DIAGNOSIS — M9903 Segmental and somatic dysfunction of lumbar region: Secondary | ICD-10-CM | POA: Diagnosis not present

## 2019-10-18 DIAGNOSIS — M9901 Segmental and somatic dysfunction of cervical region: Secondary | ICD-10-CM | POA: Diagnosis not present

## 2019-10-25 ENCOUNTER — Ambulatory Visit (INDEPENDENT_AMBULATORY_CARE_PROVIDER_SITE_OTHER): Payer: Medicare Other | Admitting: Family Medicine

## 2019-10-25 ENCOUNTER — Encounter: Payer: Self-pay | Admitting: Family Medicine

## 2019-10-25 ENCOUNTER — Other Ambulatory Visit: Payer: Self-pay

## 2019-10-25 VITALS — BP 115/80 | HR 83 | Temp 97.9°F | Resp 20 | Ht 68.0 in | Wt 125.5 lb

## 2019-10-25 DIAGNOSIS — Z79899 Other long term (current) drug therapy: Secondary | ICD-10-CM

## 2019-10-25 DIAGNOSIS — D473 Essential (hemorrhagic) thrombocythemia: Secondary | ICD-10-CM

## 2019-10-25 DIAGNOSIS — Z1159 Encounter for screening for other viral diseases: Secondary | ICD-10-CM | POA: Diagnosis not present

## 2019-10-25 DIAGNOSIS — N1831 Chronic kidney disease, stage 3a: Secondary | ICD-10-CM

## 2019-10-25 DIAGNOSIS — J449 Chronic obstructive pulmonary disease, unspecified: Secondary | ICD-10-CM

## 2019-10-25 DIAGNOSIS — I2510 Atherosclerotic heart disease of native coronary artery without angina pectoris: Secondary | ICD-10-CM

## 2019-10-25 DIAGNOSIS — K219 Gastro-esophageal reflux disease without esophagitis: Secondary | ICD-10-CM

## 2019-10-25 DIAGNOSIS — I10 Essential (primary) hypertension: Secondary | ICD-10-CM

## 2019-10-25 DIAGNOSIS — N183 Chronic kidney disease, stage 3 unspecified: Secondary | ICD-10-CM | POA: Diagnosis not present

## 2019-10-25 DIAGNOSIS — Z125 Encounter for screening for malignant neoplasm of prostate: Secondary | ICD-10-CM

## 2019-10-25 DIAGNOSIS — F17200 Nicotine dependence, unspecified, uncomplicated: Secondary | ICD-10-CM

## 2019-10-25 MED ORDER — CARVEDILOL 6.25 MG PO TABS: ORAL_TABLET | ORAL | 1 refills | Status: DC

## 2019-10-25 MED ORDER — LISINOPRIL 20 MG PO TABS: 20.0000 mg | ORAL_TABLET | Freq: Every day | ORAL | 3 refills | Status: DC

## 2019-10-25 MED ORDER — PANTOPRAZOLE SODIUM 40 MG PO TBEC: DELAYED_RELEASE_TABLET | ORAL | 0 refills | Status: DC

## 2019-10-25 MED ORDER — SUCRALFATE 1 G PO TABS: ORAL_TABLET | ORAL | 1 refills | Status: DC

## 2019-10-25 MED ORDER — ATORVASTATIN CALCIUM 80 MG PO TABS: 80.0000 mg | ORAL_TABLET | Freq: Every day | ORAL | 1 refills | Status: DC

## 2019-10-25 MED ORDER — DULOXETINE HCL 30 MG PO CPEP
30.0000 mg | ORAL_CAPSULE | Freq: Every day | ORAL | 4 refills | Status: DC
Start: 2019-10-25 — End: 2019-11-02

## 2019-10-25 NOTE — Progress Notes (Signed)
Subjective:  Patient ID: Jonathan Rosales, male    DOB: 12-16-1950  Age: 69 y.o. MRN: [id number]  CC: Medication Refill   HPI Jonathan Rosales presents for follow-up on his anxiety.  He continues to use Klonopin.  His overdose risk score is 170 out of 1000.  Sedative score is 220 and narcotic score is 090.  There is no inappropriate use of filled prescriptions based on PDMP review.  Patient tells me he had a bypass 3 years ago and he has had some problems with feeling nervous ever since that time.  He takes about a half of a Klonopin 0.5 mg tablet most days but not always.  Just when he is feeling nervous.  He tells me he is willing to try other things.  Today we completed a controlled substance agreement and his questions were answered.  Patient is followed by [facility] chest in [place] for COPD.  They prescribe his inhalers.  Additionally they have had him complete a sleep study low-dose lung cancer scan annually for the last several years.  He tells me the most recent one was 3 months ago.  He has been clean of any signs of cancer and all of those.  Additionally he has a colonoscopy set up for later this month.  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  He is doing well with this.  His last checkup had a good report.  His platelets were about normal on 500 mg a day and Hydrea.  Jonathan Rosales reports that he had a coronary bypass 3 years ago.  He currently denies any symptoms from that condition.  He does continue to take cardioprotective medications see the med list below.  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  Today she makes a commitment to try to start walking again.  He was up to 3 miles a day he is going to work on trying to get back to that level.   Depression screen [facility] 2/9 10/25/2019 04/19/2019 02/08/2019  Decreased Interest 0 0 0  Down, Depressed, Hopeless 0 0 0  PHQ - 2 Score 0 0 0    History Gumecindo has a past  medical history of Allergy, Anemia, Anxiety, Blood transfusion without reported diagnosis, Cataract, Chronic kidney disease, COPD (chronic obstructive pulmonary disease) ([place]), Coronary artery disease, Emphysema of lung ([place]), and Hypertension.   He has a past surgical history that includes Cardiac surgery; Coronary artery bypass graft; and Repair of perforated ulcer (2017).   His family history includes Alcohol abuse in his brother; COPD in his mother; Depression in his mother; Diabetes in his mother; Heart disease in his father; Kidney disease in his mother.He reports that he has been smoking cigarettes. He has been smoking about 1.00 pack per day. He has never used smokeless tobacco. He reports that he does not drink alcohol and does not use drugs.    ROS Review of Systems  Constitutional: Negative for fever.  Respiratory: Negative for shortness of breath.   Cardiovascular: Negative for chest pain.  Musculoskeletal: Negative for arthralgias.  Skin: Negative for rash.    Objective:  BP 115/80   Pulse 83   Temp 97.9 F (36.6 C) (Temporal)   Resp 20   Ht '5\' 8"'  (1.727 m)   Wt 125 lb 8 oz (56.9 kg)   SpO2 98%   BMI 19.08 kg/m   BP Readings from Last 3 Encounters:  10/25/19 115/80  02/08/19  133/86  02/08/19 108/75    Wt Readings from Last 3 Encounters:  10/25/19 125 lb 8 oz (56.9 kg)  02/08/19 128 lb (58.1 kg)  02/08/19 129 lb (58.5 kg)     Physical Exam Vitals reviewed.  Constitutional:      Appearance: He is well-developed.  HENT:     Head: Normocephalic and atraumatic.     Right Ear: External ear normal.     Left Ear: External ear normal.     Mouth/Throat:     Pharynx: No oropharyngeal exudate or posterior oropharyngeal erythema.  Eyes:     Pupils: Pupils are equal, round, and reactive to light.  Cardiovascular:     Rate and Rhythm: Normal rate and regular rhythm.     Heart sounds: No murmur heard.   Pulmonary:     Effort: No respiratory distress.      Breath sounds: No wheezing, rhonchi or rales.     Comments:   Breath sounds are distant, diminished throughout all lung fields for both inspiration and expiration Musculoskeletal:     Cervical back: Normal range of motion and neck supple.  Skin:    Findings: No lesion.  Neurological:     Mental Status: He is alert and oriented to person, place, and time.       Assessment & Plan:   Jonathan Rosales was seen today for medication refill.  Diagnoses and all orders for this visit:  Essential thrombocytosis ([place]) -     CBC with Differential/Platelet -     CMP14+EGFR  Coronary artery disease involving native heart without angina pectoris, unspecified vessel or lesion type -     atorvastatin (LIPITOR) 80 MG tablet; Take 1 tablet (80 mg total) by mouth at bedtime. -     carvedilol (COREG) 6.25 MG tablet; TAKE 1 TABLET 2 TIMES A DAY WITH A MEAL -     CBC with Differential/Platelet -     CMP14+EGFR -     Lipid panel  CKD (chronic kidney disease), stage III -     lisinopril (ZESTRIL) 20 MG tablet; Take 1 tablet (20 mg total) by mouth daily. -     CBC with Differential/Platelet -     CMP14+EGFR  Gastroesophageal reflux disease without esophagitis -     pantoprazole (PROTONIX) 40 MG tablet; TAKE  (1)  TABLET TWICE A DAY. -     sucralfate (CARAFATE) 1 g tablet; TAKE 1 TABLET FOUR TIMES DAILY BEFORE MEALS & AT BEDTIME -     CBC with Differential/Platelet -     CMP14+EGFR  Essential hypertension -     CBC with Differential/Platelet -     CMP14+EGFR -     Lipid panel  Chronic obstructive pulmonary disease, unspecified COPD type (HCC) -     CBC with Differential/Platelet -     CMP14+EGFR  Current smoker -     CBC with Differential/Platelet -     CMP14+EGFR  Screening for prostate cancer -     CBC with Differential/Platelet -     CMP14+EGFR -     PSA Total (Reflex To Free)  Need for hepatitis C screening test -     CBC with Differential/Platelet -     CMP14+EGFR -     Hepatitis C  antibody  Other orders -     DULoxetine (CYMBALTA) 30 MG capsule; Take 1 capsule (30 mg total) by mouth daily.       I have changed Jonathan Rosales's atorvastatin. I am also having  him start on DULoxetine. Additionally, I am having him maintain his acetaminophen, vitamin B-12, fluticasone, loratadine, aspirin EC, hydroxyurea, clonazePAM, Stiolto Respimat, albuterol, ferrous sulfate, carvedilol, lisinopril, pantoprazole, and sucralfate.  Allergies as of 10/25/2019   No Known Allergies     Medication List       Accurate as of October 25, 2019 11:42 AM. If you have any questions, ask your nurse or doctor.        acetaminophen 500 MG tablet Commonly known as: TYLENOL Take 500 mg by mouth daily as needed for headache (pain).   albuterol 108 (90 Base) MCG/ACT inhaler Commonly known as: VENTOLIN HFA Inhale two puffs into the lungs every 6 hours as needed.   aspirin EC 81 MG tablet Take 81 mg by mouth at bedtime.   atorvastatin 80 MG tablet Commonly known as: LIPITOR Take 1 tablet (80 mg total) by mouth at bedtime.   carvedilol 6.25 MG tablet Commonly known as: COREG TAKE 1 TABLET 2 TIMES A DAY WITH A MEAL   clonazePAM 0.5 MG tablet Commonly known as: KLONOPIN Take 0.5 tablets (0.25 mg total) by mouth daily as needed for anxiety.   DULoxetine 30 MG capsule Commonly known as: Cymbalta Take 1 capsule (30 mg total) by mouth daily. Started by: Claretta Fraise, MD   ferrous sulfate 325 (65 FE) MG tablet Commonly known as: FeroSul Take 1 tablet (325 mg total) by mouth daily. (Needs to be seen before next refill)   fluticasone 50 MCG/ACT nasal spray Commonly known as: FLONASE Place 2 sprays into both nostrils daily as needed (sinus headache).   hydroxyurea 500 MG capsule Commonly known as: HYDREA Take 1 capsule (500 mg total) by mouth daily. May take with food to minimize GI side effects.   lisinopril 20 MG tablet Commonly known as: ZESTRIL Take 1 tablet (20 mg total)  by mouth daily.   loratadine 10 MG tablet Commonly known as: CLARITIN Take 10 mg by mouth daily as needed (seasonall allergies).   pantoprazole 40 MG tablet Commonly known as: PROTONIX TAKE  (1)  TABLET TWICE A DAY.   Stiolto Respimat 2.5-2.5 MCG/ACT Aers Generic drug: Tiotropium Bromide-Olodaterol Inhale 2 puffs into the lungs daily.   sucralfate 1 g tablet Commonly known as: CARAFATE TAKE 1 TABLET FOUR TIMES DAILY BEFORE MEALS & AT BEDTIME   vitamin B-12 1000 MCG tablet Commonly known as: CYANOCOBALAMIN Take 1,000 mcg by mouth daily.        Follow-up: No follow-ups on file.  Claretta Fraise, M.D.

## 2019-10-25 NOTE — Addendum Note (Signed)
Addended by: Michaela Corner on: 10/25/2019 01:08 PM   Modules accepted: Orders

## 2019-10-26 LAB — CMP14+EGFR
ALT: 11 IU/L (ref 0–44)
AST: 16 IU/L (ref 0–40)
Albumin/Globulin Ratio: 1.6 (ref 1.2–2.2)
Albumin: 3.9 g/dL (ref 3.8–4.8)
Alkaline Phosphatase: 84 IU/L (ref 48–121)
BUN/Creatinine Ratio: 13 (ref 10–24)
BUN: 19 mg/dL (ref 8–27)
Bilirubin Total: 0.3 mg/dL (ref 0.0–1.2)
CO2: 25 mmol/L (ref 20–29)
Calcium: 9.4 mg/dL (ref 8.6–10.2)
Chloride: 101 mmol/L (ref 96–106)
Creatinine, Ser: 1.43 mg/dL — ABNORMAL HIGH (ref 0.76–1.27)
GFR calc Af Amer: 57 mL/min/{1.73_m2} — ABNORMAL LOW (ref >59)
GFR calc non Af Amer: 49 mL/min/{1.73_m2} — ABNORMAL LOW (ref >59)
Globulin, Total: 2.4 g/dL (ref 1.5–4.5)
Glucose: 82 mg/dL (ref 65–99)
Potassium: 5.1 mmol/L (ref 3.5–5.2)
Sodium: 143 mmol/L (ref 134–144)
Total Protein: 6.3 g/dL (ref 6.0–8.5)

## 2019-10-26 LAB — CBC WITH DIFFERENTIAL/PLATELET
Basophils Absolute: 0.1 10*3/uL (ref 0.0–0.2)
Basos: 1 %
EOS (ABSOLUTE): 0.3 10*3/uL (ref 0.0–0.4)
Eos: 3 %
Hematocrit: 44.7 % (ref 37.5–51.0)
Hemoglobin: 15.5 g/dL (ref 13.0–17.7)
Immature Grans (Abs): 0 10*3/uL (ref 0.0–0.1)
Immature Granulocytes: 0 %
Lymphocytes Absolute: 2.2 10*3/uL (ref 0.7–3.1)
Lymphs: 24 %
MCH: 38.9 pg — ABNORMAL HIGH (ref 26.6–33.0)
MCHC: 34.7 g/dL (ref 31.5–35.7)
MCV: 112 fL — ABNORMAL HIGH (ref 79–97)
Monocytes Absolute: 0.7 10*3/uL (ref 0.1–0.9)
Monocytes: 7 %
Neutrophils Absolute: 5.9 10*3/uL (ref 1.4–7.0)
Neutrophils: 65 %
Platelets: 461 10*3/uL — ABNORMAL HIGH (ref 150–450)
RBC: 3.98 x10E6/uL — ABNORMAL LOW (ref 4.14–5.80)
RDW: 13.1 % (ref 11.6–15.4)
WBC: 9.2 10*3/uL (ref 3.4–10.8)

## 2019-10-26 LAB — PSA TOTAL (REFLEX TO FREE): Prostate Specific Ag, Serum: 0.2 ng/mL (ref 0.0–4.0)

## 2019-10-26 LAB — LIPID PANEL
Chol/HDL Ratio: 3 ratio (ref 0.0–5.0)
Cholesterol, Total: 129 mg/dL (ref 100–199)
HDL: 43 mg/dL (ref >39)
LDL Chol Calc (NIH): 63 mg/dL (ref 0–99)
Triglycerides: 127 mg/dL (ref 0–149)
VLDL Cholesterol Cal: 23 mg/dL (ref 5–40)

## 2019-10-26 LAB — HEPATITIS C ANTIBODY: Hep C Virus Ab: <0.1 s/co ratio (ref 0.0–0.9)

## 2019-10-26 NOTE — Progress Notes (Signed)
Hello Kevis,  Your lab result is normal and/or stable.Some minor variations that are not significant are commonly marked abnormal, but do not represent any medical problem for you.  Best regards, Eliakim Tendler, M.D.

## 2019-10-28 LAB — TOXASSURE SELECT 13 (MW), URINE

## 2019-11-01 DIAGNOSIS — M9901 Segmental and somatic dysfunction of cervical region: Secondary | ICD-10-CM | POA: Diagnosis not present

## 2019-11-01 DIAGNOSIS — M5137 Other intervertebral disc degeneration, lumbosacral region: Secondary | ICD-10-CM | POA: Diagnosis not present

## 2019-11-01 DIAGNOSIS — M9902 Segmental and somatic dysfunction of thoracic region: Secondary | ICD-10-CM | POA: Diagnosis not present

## 2019-11-01 DIAGNOSIS — M9903 Segmental and somatic dysfunction of lumbar region: Secondary | ICD-10-CM | POA: Diagnosis not present

## 2019-11-02 ENCOUNTER — Encounter: Payer: Self-pay | Admitting: Family Medicine

## 2019-11-02 ENCOUNTER — Other Ambulatory Visit: Payer: Self-pay | Admitting: Family Medicine

## 2019-11-02 ENCOUNTER — Telehealth: Payer: Self-pay | Admitting: Family Medicine

## 2019-11-02 MED ORDER — ESCITALOPRAM OXALATE 10 MG PO TABS
10.0000 mg | ORAL_TABLET | Freq: Every day | ORAL | 1 refills | Status: DC
Start: 2019-11-02 — End: 2020-05-15

## 2019-11-02 MED ORDER — ONDANSETRON 8 MG PO TBDP
8.0000 mg | ORAL_TABLET | Freq: Four times a day (QID) | ORAL | 1 refills | Status: DC | PRN
Start: 2019-11-02 — End: 2020-05-15

## 2019-11-02 NOTE — Telephone Encounter (Signed)
They were informed of provider's suggestions.

## 2019-11-02 NOTE — Telephone Encounter (Signed)
Very sorry to hear that happened,.  He should discontinue the duloxetine.  If the symptoms do not resolve promptly let me know.  In the meantime I can send in a new medication for him to replace the duloxetine.  Do not start taking it until symptoms have resolved of course.  I will also send in a little some to settle his stomach in case he needs that for the throwing up and dry heaves.

## 2019-11-03 ENCOUNTER — Other Ambulatory Visit: Payer: Self-pay | Admitting: Family Medicine

## 2019-11-07 ENCOUNTER — Encounter: Payer: Self-pay | Admitting: Family Medicine

## 2019-11-07 DIAGNOSIS — F419 Anxiety disorder, unspecified: Secondary | ICD-10-CM

## 2019-11-09 MED ORDER — CLONAZEPAM 0.5 MG PO TABS: 0.2500 mg | ORAL_TABLET | Freq: Every day | ORAL | 1 refills | Status: DC | PRN

## 2019-11-10 DIAGNOSIS — J441 Chronic obstructive pulmonary disease with (acute) exacerbation: Secondary | ICD-10-CM | POA: Diagnosis not present

## 2019-11-10 DIAGNOSIS — I10 Essential (primary) hypertension: Secondary | ICD-10-CM | POA: Diagnosis not present

## 2019-11-10 DIAGNOSIS — N179 Acute kidney failure, unspecified: Secondary | ICD-10-CM | POA: Diagnosis not present

## 2019-11-10 DIAGNOSIS — D72829 Elevated white blood cell count, unspecified: Secondary | ICD-10-CM | POA: Diagnosis not present

## 2019-11-10 DIAGNOSIS — R9389 Abnormal findings on diagnostic imaging of other specified body structures: Secondary | ICD-10-CM | POA: Diagnosis not present

## 2019-11-10 DIAGNOSIS — R7989 Other specified abnormal findings of blood chemistry: Secondary | ICD-10-CM | POA: Diagnosis not present

## 2019-11-10 DIAGNOSIS — R079 Chest pain, unspecified: Secondary | ICD-10-CM | POA: Diagnosis not present

## 2019-11-10 DIAGNOSIS — I493 Ventricular premature depolarization: Secondary | ICD-10-CM | POA: Diagnosis not present

## 2019-11-10 DIAGNOSIS — Z951 Presence of aortocoronary bypass graft: Secondary | ICD-10-CM | POA: Diagnosis not present

## 2019-11-10 DIAGNOSIS — Z20822 Contact with and (suspected) exposure to covid-19: Secondary | ICD-10-CM | POA: Diagnosis not present

## 2019-11-10 DIAGNOSIS — J9602 Acute respiratory failure with hypercapnia: Secondary | ICD-10-CM | POA: Diagnosis not present

## 2019-11-10 DIAGNOSIS — I351 Nonrheumatic aortic (valve) insufficiency: Secondary | ICD-10-CM | POA: Diagnosis not present

## 2019-11-10 DIAGNOSIS — J9601 Acute respiratory failure with hypoxia: Secondary | ICD-10-CM | POA: Diagnosis not present

## 2019-11-10 DIAGNOSIS — R0602 Shortness of breath: Secondary | ICD-10-CM | POA: Diagnosis not present

## 2019-11-10 DIAGNOSIS — Z720 Tobacco use: Secondary | ICD-10-CM | POA: Diagnosis not present

## 2019-11-10 DIAGNOSIS — I712 Thoracic aortic aneurysm, without rupture: Secondary | ICD-10-CM | POA: Diagnosis not present

## 2019-11-10 DIAGNOSIS — I2510 Atherosclerotic heart disease of native coronary artery without angina pectoris: Secondary | ICD-10-CM | POA: Diagnosis not present

## 2019-11-10 DIAGNOSIS — I509 Heart failure, unspecified: Secondary | ICD-10-CM | POA: Diagnosis not present

## 2019-11-10 DIAGNOSIS — E279 Disorder of adrenal gland, unspecified: Secondary | ICD-10-CM | POA: Diagnosis not present

## 2019-11-10 DIAGNOSIS — R0603 Acute respiratory distress: Secondary | ICD-10-CM | POA: Diagnosis not present

## 2019-11-10 DIAGNOSIS — J9692 Respiratory failure, unspecified with hypercapnia: Secondary | ICD-10-CM | POA: Diagnosis not present

## 2019-11-10 DIAGNOSIS — J439 Emphysema, unspecified: Secondary | ICD-10-CM | POA: Diagnosis not present

## 2019-11-25 ENCOUNTER — Ambulatory Visit (INDEPENDENT_AMBULATORY_CARE_PROVIDER_SITE_OTHER): Payer: Medicare Other | Admitting: Family Medicine

## 2019-11-25 ENCOUNTER — Encounter: Payer: Self-pay | Admitting: Family Medicine

## 2019-11-25 ENCOUNTER — Other Ambulatory Visit: Payer: Self-pay

## 2019-11-25 VITALS — BP 125/87 | HR 92 | Temp 98.3°F | Resp 20 | Ht 68.0 in | Wt 126.2 lb

## 2019-11-25 DIAGNOSIS — J189 Pneumonia, unspecified organism: Secondary | ICD-10-CM

## 2019-11-25 NOTE — Progress Notes (Signed)
Subjective:  Patient ID: Jonathan Rosales, male    DOB: 31-Aug-1950  Age: 69 y.o. MRN: [id number]  CC: Hospitalization Follow-up   HPI Jonathan Rosales presents for transition of care related to recent hospitalization for pneumonia in [place].  He does not recall the name of the place where he was.  He woke up on July 05 with a sore throat and went on to work.  On July 06 he awoke and could not breathe.  He was taken to a local hospital where he was while traveling.  He was found to have pneumonia and admitted.  He was treated with IV antibiotics and oxygen.  He was released on July 12th.  He had been resting until yesterday.  He did some yard work went to [employer] did some exercise at [employer] and went walking.  Today he feels wiped out again.  He denies ongoing shortness of breath.  That went away while still in the hospital.  He has not had any fever chills or sweats  Depression screen [facility] 2/9 11/25/2019 10/25/2019 04/19/2019  Decreased Interest 0 0 0  Down, Depressed, Hopeless 0 0 0  PHQ - 2 Score 0 0 0    History Yuya has a past medical history of Allergy, Anemia, Anxiety, Blood transfusion without reported diagnosis, Cataract, Chronic kidney disease, COPD (chronic obstructive pulmonary disease) ([place]), Coronary artery disease, Emphysema of lung ([place]), and Hypertension.   He has a past surgical history that includes Cardiac surgery; Coronary artery bypass graft; and Repair of perforated ulcer (2017).   His family history includes Alcohol abuse in his brother; COPD in his mother; Depression in his mother; Diabetes in his mother; Heart disease in his father; Kidney disease in his mother.He reports that he has been smoking cigarettes. He has been smoking about 1.00 pack per day. He has never used smokeless tobacco. He reports that he does not drink alcohol and does not use drugs.    ROS Review of Systems  Constitutional: Negative for fever.  Respiratory: Negative for  shortness of breath.   Cardiovascular: Negative for chest pain.  Musculoskeletal: Negative for arthralgias.  Skin: Negative for rash.    Objective:  BP (!) 125/87   Pulse 92   Temp 98.3 F (36.8 C) (Temporal)   Resp 20   Ht 5\' 8"  (1.727 m)   Wt 126 lb 4 oz (57.3 kg)   SpO2 97%   BMI 19.20 kg/m   BP Readings from Last 3 Encounters:  11/25/19 (!) 125/87  10/25/19 115/80  02/08/19 133/86    Wt Readings from Last 3 Encounters:  11/25/19 126 lb 4 oz (57.3 kg)  10/25/19 125 lb 8 oz (56.9 kg)  02/08/19 128 lb (58.1 kg)     Physical Exam Vitals reviewed.  Constitutional:      Appearance: He is well-developed.  HENT:     Head: Normocephalic and atraumatic.     Right Ear: External ear normal.     Left Ear: External ear normal.     Mouth/Throat:     Pharynx: No oropharyngeal exudate or posterior oropharyngeal erythema.  Eyes:     Pupils: Pupils are equal, round, and reactive to light.  Cardiovascular:     Rate and Rhythm: Normal rate and regular rhythm.     Heart sounds: No murmur heard.   Pulmonary:     Effort: No respiratory distress.     Breath sounds: No wheezing or rhonchi.     Comments:  Breath sounds are mildly diminished at the left lower lobe.  There is no record in the patient does not know what that was originally involved in the pneumonia. Musculoskeletal:     Cervical back: Normal range of motion and neck supple.  Neurological:     Mental Status: He is alert and oriented to person, place, and time.       Assessment & Plan:   Jonathan Rosales was seen today for hospitalization follow-up.  Diagnoses and all orders for this visit:  Pneumonia due to infectious organism, unspecified laterality, unspecified part of lung    Return in 6 weeks for pneumonia vaccine.  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   I am having Jonathan Rosales maintain his acetaminophen, vitamin B-12, fluticasone, loratadine,  aspirin EC, hydroxyurea, Stiolto Respimat, albuterol, atorvastatin, carvedilol, lisinopril, pantoprazole, sucralfate, escitalopram, ondansetron, FeroSul, and clonazePAM.  Allergies as of 11/25/2019      Reactions   Cymbalta [duloxetine Hcl] Nausea And Vomiting      Medication List       Accurate as of November 25, 2019  1:59 PM. If you have any questions, ask your nurse or doctor.        acetaminophen 500 MG tablet Commonly known as: TYLENOL Take 500 mg by mouth daily as needed for headache (pain).   albuterol 108 (90 Base) MCG/ACT inhaler Commonly known as: VENTOLIN HFA Inhale two puffs into the lungs every 6 hours as needed.   aspirin EC 81 MG tablet Take 81 mg by mouth at bedtime.   atorvastatin 80 MG tablet Commonly known as: LIPITOR Take 1 tablet (80 mg total) by mouth at bedtime.   carvedilol 6.25 MG tablet Commonly known as: COREG TAKE 1 TABLET 2 TIMES A DAY WITH A MEAL   clonazePAM 0.5 MG tablet Commonly known as: KLONOPIN Take 0.5 tablets (0.25 mg total) by mouth daily as needed for anxiety.   escitalopram 10 MG tablet Commonly known as: LEXAPRO Take 1 tablet (10 mg total) by mouth daily.   FeroSul 325 (65 FE) MG tablet Generic drug: ferrous sulfate Take 1 tablet (325 mg total) by mouth daily. (Needs to be seen before next refill)   fluticasone 50 MCG/ACT nasal spray Commonly known as: FLONASE Place 2 sprays into both nostrils daily as needed (sinus headache).   hydroxyurea 500 MG capsule Commonly known as: HYDREA Take 1 capsule (500 mg total) by mouth daily. May take with food to minimize GI side effects.   lisinopril 20 MG tablet Commonly known as: ZESTRIL Take 1 tablet (20 mg total) by mouth daily.   loratadine 10 MG tablet Commonly known as: CLARITIN Take 10 mg by mouth daily as needed (seasonall allergies).   ondansetron 8 MG disintegrating tablet Commonly known as: ZOFRAN-ODT Take 1 tablet (8 mg total) by mouth every 6 (six) hours as needed for  nausea or vomiting.   pantoprazole 40 MG tablet Commonly known as: PROTONIX TAKE  (1)  TABLET TWICE A DAY.   Stiolto Respimat 2.5-2.5 MCG/ACT Aers Generic drug: Tiotropium Bromide-Olodaterol Inhale 2 puffs into the lungs daily.   sucralfate 1 g tablet Commonly known as: CARAFATE TAKE 1 TABLET FOUR TIMES DAILY BEFORE MEALS & AT BEDTIME   vitamin B-12 1000 MCG tablet Commonly known as: CYANOCOBALAMIN Take 1,000 mcg by mouth daily.        Follow-up: Return if symptoms worsen or fail to improve.  Claretta Fraise, M.D.

## 2019-11-29 DIAGNOSIS — J439 Emphysema, unspecified: Secondary | ICD-10-CM | POA: Diagnosis not present

## 2019-11-29 DIAGNOSIS — R911 Solitary pulmonary nodule: Secondary | ICD-10-CM | POA: Diagnosis not present

## 2019-12-14 ENCOUNTER — Other Ambulatory Visit: Payer: Medicare Other

## 2019-12-14 ENCOUNTER — Ambulatory Visit: Payer: Medicare Other | Admitting: Hematology and Oncology

## 2019-12-15 ENCOUNTER — Other Ambulatory Visit: Payer: Self-pay | Admitting: Family Medicine

## 2019-12-16 ENCOUNTER — Other Ambulatory Visit: Payer: Self-pay | Admitting: *Deleted

## 2019-12-16 DIAGNOSIS — M9903 Segmental and somatic dysfunction of lumbar region: Secondary | ICD-10-CM | POA: Diagnosis not present

## 2019-12-16 DIAGNOSIS — D473 Essential (hemorrhagic) thrombocythemia: Secondary | ICD-10-CM

## 2019-12-16 DIAGNOSIS — M9902 Segmental and somatic dysfunction of thoracic region: Secondary | ICD-10-CM | POA: Diagnosis not present

## 2019-12-16 DIAGNOSIS — M5137 Other intervertebral disc degeneration, lumbosacral region: Secondary | ICD-10-CM | POA: Diagnosis not present

## 2019-12-16 DIAGNOSIS — M9901 Segmental and somatic dysfunction of cervical region: Secondary | ICD-10-CM | POA: Diagnosis not present

## 2019-12-20 ENCOUNTER — Inpatient Hospital Stay: Payer: Medicare Other

## 2019-12-20 ENCOUNTER — Inpatient Hospital Stay: Payer: Medicare Other | Attending: Hematology and Oncology | Admitting: Hematology and Oncology

## 2019-12-20 ENCOUNTER — Other Ambulatory Visit: Payer: Self-pay

## 2019-12-20 DIAGNOSIS — Z7982 Long term (current) use of aspirin: Secondary | ICD-10-CM | POA: Diagnosis not present

## 2019-12-20 DIAGNOSIS — Z79899 Other long term (current) drug therapy: Secondary | ICD-10-CM | POA: Diagnosis not present

## 2019-12-20 DIAGNOSIS — D473 Essential (hemorrhagic) thrombocythemia: Secondary | ICD-10-CM | POA: Insufficient documentation

## 2019-12-20 LAB — CBC WITH DIFFERENTIAL (CANCER CENTER ONLY)
Abs Immature Granulocytes: 0.04 10*3/uL (ref 0.00–0.07)
Basophils Absolute: 0.1 10*3/uL (ref 0.0–0.1)
Basophils Relative: 1 %
Eosinophils Absolute: 0.2 10*3/uL (ref 0.0–0.5)
Eosinophils Relative: 2 %
HCT: 46.1 % (ref 39.0–52.0)
Hemoglobin: 15.3 g/dL (ref 13.0–17.0)
Immature Granulocytes: 0 %
Lymphocytes Relative: 20 %
Lymphs Abs: 2.2 10*3/uL (ref 0.7–4.0)
MCH: 37.9 pg — ABNORMAL HIGH (ref 26.0–34.0)
MCHC: 33.2 g/dL (ref 30.0–36.0)
MCV: 114.1 fL — ABNORMAL HIGH (ref 80.0–100.0)
Monocytes Absolute: 1 10*3/uL (ref 0.1–1.0)
Monocytes Relative: 9 %
Neutro Abs: 7.5 10*3/uL (ref 1.7–7.7)
Neutrophils Relative %: 68 %
Platelet Count: 489 10*3/uL — ABNORMAL HIGH (ref 150–400)
RBC: 4.04 MIL/uL — ABNORMAL LOW (ref 4.22–5.81)
RDW: 13.6 % (ref 11.5–15.5)
WBC Count: 10.9 10*3/uL — ABNORMAL HIGH (ref 4.0–10.5)
nRBC: 0 % (ref 0.0–0.2)

## 2019-12-20 MED ORDER — HYDROXYUREA 500 MG PO CAPS: 500.0000 mg | ORAL_CAPSULE | Freq: Every day | ORAL | 3 refills | Status: DC

## 2019-12-20 NOTE — Progress Notes (Signed)
Patient Care Team: Claretta Fraise, MD as PCP - General (Family Medicine) [name], MD (Inactive) as PCP - Cardiology (Cardiology)  DIAGNOSIS:    ICD-10-CM   1. Essential thrombocytosis (HCC)  D47.3 CBC with Differential (Cancer Center Only)    CHIEF COMPLIANT: Follow-up of essential thrombocytosis  INTERVAL HISTORY: Jonathan Rosales is a 69 y.o. with above-mentioned history of essential thrombocytosis who is currently on hydroxyurea 500mg  daily. He presents to the clinic today for annual follow-up.   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  He has not noticed any bruising or bleeding or blood clots of any kind.  He is tolerating hydroxyurea fairly well.  He says that he had non-Covid pneumonia earlier this year and he has recovered from that.  This was in [place].  ALLERGIES:  is allergic to cymbalta [duloxetine hcl].  MEDICATIONS:  Current Outpatient Medications  Medication Sig Dispense Refill  . acetaminophen (TYLENOL) 500 MG tablet Take 500 mg by mouth daily as needed for headache (pain).     Marland Kitchen albuterol (VENTOLIN HFA) 108 (90 Base) MCG/ACT inhaler Inhale two puffs into the lungs every 6 hours as needed. 8.5 g 0  . aspirin EC 81 MG tablet Take 81 mg by mouth at bedtime.     Marland Kitchen atorvastatin (LIPITOR) 80 MG tablet Take 1 tablet (80 mg total) by mouth at bedtime. 90 tablet 1  . carvedilol (COREG) 6.25 MG tablet TAKE 1 TABLET 2 TIMES A DAY WITH A MEAL 180 tablet 1  . clonazePAM (KLONOPIN) 0.5 MG tablet Take 0.5 tablets (0.25 mg total) by mouth daily as needed for anxiety. 45 tablet 1  . escitalopram (LEXAPRO) 10 MG tablet Take 1 tablet (10 mg total) by mouth daily. 90 tablet 1  . ferrous sulfate (FEROSUL) 325 (65 FE) MG tablet Take 1 tablet (325 mg total) by mouth daily with breakfast. 30 tablet 5  . fluticasone (FLONASE) 50 MCG/ACT nasal spray Place 2 sprays into both nostrils daily as needed (sinus headache).     . hydroxyurea (HYDREA) 500 MG capsule Take 1 capsule (500  mg total) by mouth daily. May take with food to minimize GI side effects. 90 capsule 3  . lisinopril (ZESTRIL) 20 MG tablet Take 1 tablet (20 mg total) by mouth daily. 90 tablet 3  . loratadine (CLARITIN) 10 MG tablet Take 10 mg by mouth daily as needed (seasonall allergies).     . ondansetron (ZOFRAN-ODT) 8 MG disintegrating tablet Take 1 tablet (8 mg total) by mouth every 6 (six) hours as needed for nausea or vomiting. 20 tablet 1  . pantoprazole (PROTONIX) 40 MG tablet TAKE  (1)  TABLET TWICE A DAY. 180 tablet 0  . sucralfate (CARAFATE) 1 g tablet TAKE 1 TABLET FOUR TIMES DAILY BEFORE MEALS & AT BEDTIME 360 tablet 1  . Tiotropium Bromide-Olodaterol (STIOLTO RESPIMAT) 2.5-2.5 MCG/ACT AERS Inhale 2 puffs into the lungs daily. 4 g 5  . vitamin B-12 (CYANOCOBALAMIN) 1000 MCG tablet Take 1,000 mcg by mouth daily.     No current facility-administered medications for this visit.    PHYSICAL EXAMINATION: ECOG PERFORMANCE STATUS: 0 - Asymptomatic  Vitals:   12/20/19 1158  BP: (!) 142/91  Pulse: 72  Resp: 17  Temp: (!) 97.1 F (36.2 C)  SpO2: 100%   Filed Weights   12/20/19 1158  Weight: 125 lb 11.2 oz (57 kg)    LABORATORY DATA:  I have reviewed the data as listed CMP Latest Ref Rng & Units  10/25/2019 02/08/2019 01/26/2018  Glucose 65 - 99 mg/dL 82 87 108(H)  BUN 8 - 27 mg/dL 19 24 19   Creatinine 0.76 - 1.27 mg/dL 1.43(H) 1.52(H) 1.53(H)  Sodium 134 - 144 mmol/L 143 143 139  Potassium 3.5 - 5.2 mmol/L 5.1 4.9 4.1  Chloride 96 - 106 mmol/L 101 103 106  CO2 20 - 29 mmol/L 25 25 25   Calcium 8.6 - 10.2 mg/dL 9.4 9.4 8.9  Total Protein 6.0 - 8.5 g/dL 6.3 6.2 6.9  Total Bilirubin 0.0 - 1.2 mg/dL 0.3 0.2 0.5  Alkaline Phos 48 - 121 IU/L 84 90 68  AST 0 - 40 IU/L 16 13 14(L)  ALT 0 - 44 IU/L 11 16 13     Lab Results  Component Value Date   WBC 10.9 (H) 12/20/2019   HGB 15.3 12/20/2019   HCT 46.1 12/20/2019   MCV 114.1 (H) 12/20/2019   PLT 489 (H) 12/20/2019   NEUTROABS 7.5  12/20/2019    ASSESSMENT & PLAN:  Essential thrombocytosis ([place]) Patient was followed at [facility] where he was diagnosed with essential thrombocytosis with a platelet count greater than 900,000 as part of a routine wellness exam 03/11/2016. Jak 2 mutation positive  Current treatment: Hydroxyurea 500 mg daily started 04/01/2016.  Lab review:  WBC 10.9, platelets 489, hemoglobin 15.3 His platelet counts have been trending upwards but overall they are in the reasonable range.  Recommendation will be to continue the Hydrea at the same dosage. I renewed hydroxyurea for another year. Return to clinic in 1 year for follow-up    Orders Placed This Encounter  Procedures  . CBC with Differential (Cancer Center Only)    Standing Status:   Future    Standing Expiration Date:   12/19/2020   The patient has a good understanding of the overall plan. he agrees with it. he will call with any problems that may develop before the next visit here.  Total time spent: 20 mins including face to face time and time spent for planning, charting and coordination of care  Nicholas Lose, MD 12/20/2019  I, Cloyde Reams Dorshimer, am acting as scribe for Dr. Nicholas Lose.  I have reviewed the above documentation for accuracy and completeness, and I agree with the above.

## 2019-12-20 NOTE — Assessment & Plan Note (Signed)
Patient was followed at [facility] where he was diagnosed with essential thrombocytosis with a platelet count greater than 900,000 as part of a routine wellness exam 03/11/2016. Jak 2 mutation positive  Current treatment: Hydroxyurea 500 mg daily started 04/01/2016.  Lab review: WBC 9.2, hemoglobin 14.3, platelet count 392 His labs appear to be doing well and he appears to be tolerating the treatment very well.  Recommendation will be to continue the Hydrea at the same dosage. I renewed hydroxyurea for another year. Return to clinic in 1 year for follow-up

## 2019-12-21 ENCOUNTER — Telehealth: Payer: Self-pay | Admitting: Hematology and Oncology

## 2019-12-21 NOTE — Telephone Encounter (Signed)
Scheduled per 8/16 los. Called and spoke with pt wife, confirmed 8/15 appt

## 2019-12-27 DIAGNOSIS — M5137 Other intervertebral disc degeneration, lumbosacral region: Secondary | ICD-10-CM | POA: Diagnosis not present

## 2019-12-27 DIAGNOSIS — M9901 Segmental and somatic dysfunction of cervical region: Secondary | ICD-10-CM | POA: Diagnosis not present

## 2019-12-27 DIAGNOSIS — M9903 Segmental and somatic dysfunction of lumbar region: Secondary | ICD-10-CM | POA: Diagnosis not present

## 2019-12-27 DIAGNOSIS — M9902 Segmental and somatic dysfunction of thoracic region: Secondary | ICD-10-CM | POA: Diagnosis not present

## 2020-01-17 DIAGNOSIS — M5137 Other intervertebral disc degeneration, lumbosacral region: Secondary | ICD-10-CM | POA: Diagnosis not present

## 2020-01-17 DIAGNOSIS — Z961 Presence of intraocular lens: Secondary | ICD-10-CM | POA: Diagnosis not present

## 2020-01-17 DIAGNOSIS — H524 Presbyopia: Secondary | ICD-10-CM | POA: Diagnosis not present

## 2020-01-17 DIAGNOSIS — M9901 Segmental and somatic dysfunction of cervical region: Secondary | ICD-10-CM | POA: Diagnosis not present

## 2020-01-17 DIAGNOSIS — M9902 Segmental and somatic dysfunction of thoracic region: Secondary | ICD-10-CM | POA: Diagnosis not present

## 2020-01-17 DIAGNOSIS — H35033 Hypertensive retinopathy, bilateral: Secondary | ICD-10-CM | POA: Diagnosis not present

## 2020-01-17 DIAGNOSIS — M9903 Segmental and somatic dysfunction of lumbar region: Secondary | ICD-10-CM | POA: Diagnosis not present

## 2020-01-17 DIAGNOSIS — H35363 Drusen (degenerative) of macula, bilateral: Secondary | ICD-10-CM | POA: Diagnosis not present

## 2020-01-24 ENCOUNTER — Encounter: Payer: Self-pay | Admitting: Family Medicine

## 2020-01-24 ENCOUNTER — Ambulatory Visit (INDEPENDENT_AMBULATORY_CARE_PROVIDER_SITE_OTHER): Payer: Medicare Other | Admitting: Family Medicine

## 2020-01-24 ENCOUNTER — Other Ambulatory Visit: Payer: Self-pay

## 2020-01-24 VITALS — BP 127/86 | HR 78 | Temp 97.3°F | Resp 20 | Ht 68.0 in | Wt 126.0 lb

## 2020-01-24 DIAGNOSIS — M9901 Segmental and somatic dysfunction of cervical region: Secondary | ICD-10-CM | POA: Diagnosis not present

## 2020-01-24 DIAGNOSIS — Z0289 Encounter for other administrative examinations: Secondary | ICD-10-CM | POA: Diagnosis not present

## 2020-01-24 DIAGNOSIS — M5137 Other intervertebral disc degeneration, lumbosacral region: Secondary | ICD-10-CM | POA: Diagnosis not present

## 2020-01-24 DIAGNOSIS — M9902 Segmental and somatic dysfunction of thoracic region: Secondary | ICD-10-CM | POA: Diagnosis not present

## 2020-01-24 DIAGNOSIS — M9903 Segmental and somatic dysfunction of lumbar region: Secondary | ICD-10-CM | POA: Diagnosis not present

## 2020-01-24 DIAGNOSIS — Z024 Encounter for examination for driving license: Secondary | ICD-10-CM

## 2020-01-24 LAB — URINALYSIS
Bilirubin, UA: NEGATIVE
Glucose, UA: NEGATIVE
Ketones, UA: NEGATIVE
Leukocytes,UA: NEGATIVE
Nitrite, UA: NEGATIVE
Protein,UA: NEGATIVE
RBC, UA: NEGATIVE
Specific Gravity, UA: 1.01 (ref 1.005–1.030)
Urobilinogen, Ur: 0.2 mg/dL (ref 0.2–1.0)
pH, UA: 6.5 (ref 5.0–7.5)

## 2020-01-24 NOTE — Progress Notes (Signed)
DOT exam. See attached form

## 2020-02-07 DIAGNOSIS — M5137 Other intervertebral disc degeneration, lumbosacral region: Secondary | ICD-10-CM | POA: Diagnosis not present

## 2020-02-07 DIAGNOSIS — M9902 Segmental and somatic dysfunction of thoracic region: Secondary | ICD-10-CM | POA: Diagnosis not present

## 2020-02-07 DIAGNOSIS — M9901 Segmental and somatic dysfunction of cervical region: Secondary | ICD-10-CM | POA: Diagnosis not present

## 2020-02-07 DIAGNOSIS — M9903 Segmental and somatic dysfunction of lumbar region: Secondary | ICD-10-CM | POA: Diagnosis not present

## 2020-02-14 DIAGNOSIS — M9901 Segmental and somatic dysfunction of cervical region: Secondary | ICD-10-CM | POA: Diagnosis not present

## 2020-02-14 DIAGNOSIS — M9903 Segmental and somatic dysfunction of lumbar region: Secondary | ICD-10-CM | POA: Diagnosis not present

## 2020-02-14 DIAGNOSIS — M5137 Other intervertebral disc degeneration, lumbosacral region: Secondary | ICD-10-CM | POA: Diagnosis not present

## 2020-02-14 DIAGNOSIS — M9902 Segmental and somatic dysfunction of thoracic region: Secondary | ICD-10-CM | POA: Diagnosis not present

## 2020-02-21 ENCOUNTER — Other Ambulatory Visit: Payer: Self-pay | Admitting: Family Medicine

## 2020-02-21 ENCOUNTER — Encounter: Payer: Self-pay | Admitting: Family Medicine

## 2020-02-21 DIAGNOSIS — M9902 Segmental and somatic dysfunction of thoracic region: Secondary | ICD-10-CM | POA: Diagnosis not present

## 2020-02-21 DIAGNOSIS — M9903 Segmental and somatic dysfunction of lumbar region: Secondary | ICD-10-CM | POA: Diagnosis not present

## 2020-02-21 DIAGNOSIS — M9901 Segmental and somatic dysfunction of cervical region: Secondary | ICD-10-CM | POA: Diagnosis not present

## 2020-02-21 DIAGNOSIS — M5137 Other intervertebral disc degeneration, lumbosacral region: Secondary | ICD-10-CM | POA: Diagnosis not present

## 2020-03-02 ENCOUNTER — Other Ambulatory Visit: Payer: Self-pay | Admitting: Family Medicine

## 2020-03-02 DIAGNOSIS — K219 Gastro-esophageal reflux disease without esophagitis: Secondary | ICD-10-CM

## 2020-03-06 DIAGNOSIS — M9903 Segmental and somatic dysfunction of lumbar region: Secondary | ICD-10-CM | POA: Diagnosis not present

## 2020-03-06 DIAGNOSIS — M5137 Other intervertebral disc degeneration, lumbosacral region: Secondary | ICD-10-CM | POA: Diagnosis not present

## 2020-03-06 DIAGNOSIS — M9901 Segmental and somatic dysfunction of cervical region: Secondary | ICD-10-CM | POA: Diagnosis not present

## 2020-03-06 DIAGNOSIS — M9902 Segmental and somatic dysfunction of thoracic region: Secondary | ICD-10-CM | POA: Diagnosis not present

## 2020-03-13 DIAGNOSIS — M5137 Other intervertebral disc degeneration, lumbosacral region: Secondary | ICD-10-CM | POA: Diagnosis not present

## 2020-03-13 DIAGNOSIS — M9903 Segmental and somatic dysfunction of lumbar region: Secondary | ICD-10-CM | POA: Diagnosis not present

## 2020-03-13 DIAGNOSIS — M9902 Segmental and somatic dysfunction of thoracic region: Secondary | ICD-10-CM | POA: Diagnosis not present

## 2020-03-13 DIAGNOSIS — M9901 Segmental and somatic dysfunction of cervical region: Secondary | ICD-10-CM | POA: Diagnosis not present

## 2020-03-29 ENCOUNTER — Other Ambulatory Visit: Payer: Self-pay | Admitting: Family Medicine

## 2020-03-29 DIAGNOSIS — M9901 Segmental and somatic dysfunction of cervical region: Secondary | ICD-10-CM | POA: Diagnosis not present

## 2020-03-29 DIAGNOSIS — M5137 Other intervertebral disc degeneration, lumbosacral region: Secondary | ICD-10-CM | POA: Diagnosis not present

## 2020-03-29 DIAGNOSIS — M9903 Segmental and somatic dysfunction of lumbar region: Secondary | ICD-10-CM | POA: Diagnosis not present

## 2020-03-29 DIAGNOSIS — M9902 Segmental and somatic dysfunction of thoracic region: Secondary | ICD-10-CM | POA: Diagnosis not present

## 2020-04-03 ENCOUNTER — Other Ambulatory Visit: Payer: Self-pay | Admitting: Family Medicine

## 2020-04-03 ENCOUNTER — Ambulatory Visit (INDEPENDENT_AMBULATORY_CARE_PROVIDER_SITE_OTHER): Payer: Medicare Other | Admitting: Family Medicine

## 2020-04-03 DIAGNOSIS — M9903 Segmental and somatic dysfunction of lumbar region: Secondary | ICD-10-CM | POA: Diagnosis not present

## 2020-04-03 DIAGNOSIS — M5137 Other intervertebral disc degeneration, lumbosacral region: Secondary | ICD-10-CM | POA: Diagnosis not present

## 2020-04-03 DIAGNOSIS — M9902 Segmental and somatic dysfunction of thoracic region: Secondary | ICD-10-CM | POA: Diagnosis not present

## 2020-04-03 DIAGNOSIS — M9901 Segmental and somatic dysfunction of cervical region: Secondary | ICD-10-CM | POA: Diagnosis not present

## 2020-04-03 NOTE — Progress Notes (Signed)
Patient has already spoken to PCP.  Illness has already been addressed.  Will cc: Kenney Houseman so patient is no penalized

## 2020-04-13 ENCOUNTER — Telehealth: Payer: Self-pay | Admitting: Family Medicine

## 2020-04-13 DIAGNOSIS — F419 Anxiety disorder, unspecified: Secondary | ICD-10-CM

## 2020-04-13 NOTE — Telephone Encounter (Signed)
°  Prescription Request  04/13/2020  What is the name of the medication or equipment? clonazepam  Have you contacted your pharmacy to request a refill? (if applicable) yes  Which pharmacy would you like this sent to? [facility]    Patient notified that their request is being sent to the clinical staff for review and that they should receive a response within 2 business days.

## 2020-04-14 ENCOUNTER — Other Ambulatory Visit: Payer: Self-pay | Admitting: Family Medicine

## 2020-04-14 DIAGNOSIS — F419 Anxiety disorder, unspecified: Secondary | ICD-10-CM

## 2020-04-14 MED ORDER — CLONAZEPAM 0.5 MG PO TABS: 0.2500 mg | ORAL_TABLET | Freq: Every day | ORAL | 0 refills | Status: DC | PRN

## 2020-04-14 NOTE — Telephone Encounter (Signed)
Aware NTBS to have this refilled, has appt in January. In the future they will make a 3 mos appt when he leaves Would like to know if he can have enough fill till his appt on 05/15/20

## 2020-04-20 ENCOUNTER — Ambulatory Visit: Payer: Medicare Other

## 2020-05-15 ENCOUNTER — Other Ambulatory Visit: Payer: Self-pay

## 2020-05-15 ENCOUNTER — Encounter: Payer: Self-pay | Admitting: Family Medicine

## 2020-05-15 ENCOUNTER — Ambulatory Visit (INDEPENDENT_AMBULATORY_CARE_PROVIDER_SITE_OTHER): Payer: Medicare Other | Admitting: Family Medicine

## 2020-05-15 VITALS — BP 139/81 | HR 76 | Temp 98.1°F | Ht 68.0 in | Wt 128.4 lb

## 2020-05-15 DIAGNOSIS — M9903 Segmental and somatic dysfunction of lumbar region: Secondary | ICD-10-CM | POA: Diagnosis not present

## 2020-05-15 DIAGNOSIS — I10 Essential (primary) hypertension: Secondary | ICD-10-CM

## 2020-05-15 DIAGNOSIS — M9902 Segmental and somatic dysfunction of thoracic region: Secondary | ICD-10-CM | POA: Diagnosis not present

## 2020-05-15 DIAGNOSIS — I2510 Atherosclerotic heart disease of native coronary artery without angina pectoris: Secondary | ICD-10-CM | POA: Diagnosis not present

## 2020-05-15 DIAGNOSIS — M9901 Segmental and somatic dysfunction of cervical region: Secondary | ICD-10-CM | POA: Diagnosis not present

## 2020-05-15 DIAGNOSIS — Z23 Encounter for immunization: Secondary | ICD-10-CM

## 2020-05-15 DIAGNOSIS — K219 Gastro-esophageal reflux disease without esophagitis: Secondary | ICD-10-CM

## 2020-05-15 DIAGNOSIS — M5137 Other intervertebral disc degeneration, lumbosacral region: Secondary | ICD-10-CM | POA: Diagnosis not present

## 2020-05-15 DIAGNOSIS — F419 Anxiety disorder, unspecified: Secondary | ICD-10-CM

## 2020-05-15 MED ORDER — SERTRALINE HCL 25 MG PO TABS: 25.0000 mg | ORAL_TABLET | Freq: Every day | ORAL | 1 refills | Status: DC

## 2020-05-15 MED ORDER — CARVEDILOL 6.25 MG PO TABS: ORAL_TABLET | ORAL | 1 refills | Status: DC

## 2020-05-15 MED ORDER — STIOLTO RESPIMAT 2.5-2.5 MCG/ACT IN AERS
2.0000 | INHALATION_SPRAY | Freq: Every day | RESPIRATORY_TRACT | 1 refills | Status: DC
Start: 2020-05-15 — End: 2020-08-07

## 2020-05-15 MED ORDER — ATORVASTATIN CALCIUM 80 MG PO TABS: 80.0000 mg | ORAL_TABLET | Freq: Every day | ORAL | 1 refills | Status: DC

## 2020-05-15 MED ORDER — ALBUTEROL SULFATE HFA 108 (90 BASE) MCG/ACT IN AERS: 2.0000 | INHALATION_SPRAY | Freq: Four times a day (QID) | RESPIRATORY_TRACT | 1 refills | Status: DC | PRN

## 2020-05-15 MED ORDER — CLONAZEPAM 0.5 MG PO TABS: 0.2500 mg | ORAL_TABLET | Freq: Every day | ORAL | 5 refills | Status: DC | PRN

## 2020-05-15 MED ORDER — PANTOPRAZOLE SODIUM 40 MG PO TBEC: 40.0000 mg | DELAYED_RELEASE_TABLET | Freq: Two times a day (BID) | ORAL | 1 refills | Status: DC

## 2020-05-15 MED ORDER — ONDANSETRON 8 MG PO TBDP: 8.0000 mg | ORAL_TABLET | Freq: Four times a day (QID) | ORAL | 1 refills | Status: DC | PRN

## 2020-05-15 MED ORDER — SUCRALFATE 1 G PO TABS: ORAL_TABLET | ORAL | 1 refills | Status: DC

## 2020-05-15 MED ORDER — FERROUS SULFATE 325 (65 FE) MG PO TABS
325.0000 mg | ORAL_TABLET | Freq: Every day | ORAL | 1 refills | Status: DC
Start: 2020-05-15 — End: 2020-10-30

## 2020-05-15 NOTE — Progress Notes (Signed)
Subjective:  Patient ID: Jonathan Rosales, male    DOB: June 12, 1950  Age: 70 y.o. MRN: [id number]  CC: Medical Management of Chronic Issues (6 month F/u)   HPI DONNY HUTLEY presents for follow-up of his chronic anxiety.  He just takes a half of clonazepam daily to help relieve the edge.  He was taking escitalopram and before that Cymbalta both of them made him nauseous.  So he discontinued each of them.  Today he says he is doing well he has no complaints.   Follow-up of hypertension. Patient has no history of headache chest pain or shortness of breath or recent cough. Patient also denies symptoms of TIA such as numbness weakness lateralizing. Patient checks  blood pressure at home and has not had any elevated readings recently. Patient denies side effects from his medication. States taking it regularly.  Patient has history of coronary artery disease and he is a DOT certified driver.  He needs to have a stress test prior to his next certification.  Currently he is asymptomatic.  He is managing risk factors with medication  Patient in for follow-up of elevated cholesterol. Doing well without complaints on current medication. Denies side effects of statin including myalgia and arthralgia and nausea. Also in today for liver function testing. Currently no chest pain, shortness of breath or other cardiovascular related symptoms noted.  Patient in for follow-up of GERD. Currently asymptomatic taking  PPI daily. There is no chest pain or heartburn. No hematemesis and no melena. No dysphagia or choking. Onset is remote. Progression is stable. Complicating factors, none.    Depression screen [facility] 2/9 05/15/2020 01/24/2020 11/25/2019  Decreased Interest 0 0 0  Down, Depressed, Hopeless 0 0 0  PHQ - 2 Score 0 0 0    History Hilman has a past medical history of Allergy, Anemia, Anxiety, Blood transfusion without reported diagnosis, Cataract, Chronic kidney disease, COPD (chronic obstructive  pulmonary disease) ([place]), Coronary artery disease, Emphysema of lung ([place]), and Hypertension.   He has a past surgical history that includes Cardiac surgery; Coronary artery bypass graft; and Repair of perforated ulcer (2017).   His family history includes Alcohol abuse in his brother; COPD in his mother; Depression in his mother; Diabetes in his mother; Heart disease in his father; Kidney disease in his mother.He reports that he has been smoking cigarettes. He has been smoking about 1.00 pack per day. He has never used smokeless tobacco. He reports that he does not drink alcohol and does not use drugs.    ROS Review of Systems  Constitutional: Negative for fever.  Respiratory: Negative for shortness of breath.   Cardiovascular: Negative for chest pain.  Musculoskeletal: Negative for arthralgias.  Skin: Negative for rash.    Objective:  BP 139/81   Pulse 76   Temp 98.1 F (36.7 C) (Temporal)   Ht 5\' 8"  (1.727 m)   Wt 128 lb 6.4 oz (58.2 kg)   BMI 19.52 kg/m   BP Readings from Last 3 Encounters:  05/15/20 139/81  01/24/20 127/86  12/20/19 (!) 142/91    Wt Readings from Last 3 Encounters:  05/15/20 128 lb 6.4 oz (58.2 kg)  01/24/20 126 lb (57.2 kg)  12/20/19 125 lb 11.2 oz (57 kg)     Physical Exam Vitals reviewed.  Constitutional:      Appearance: He is well-developed and well-nourished.  HENT:     Head: Normocephalic and atraumatic.     Right Ear: Tympanic membrane and external ear  normal. No decreased hearing noted.     Left Ear: Tympanic membrane and external ear normal. No decreased hearing noted.     Mouth/Throat:     Pharynx: No oropharyngeal exudate or posterior oropharyngeal erythema.  Eyes:     Pupils: Pupils are equal, round, and reactive to light.  Cardiovascular:     Rate and Rhythm: Normal rate and regular rhythm.     Heart sounds: No murmur heard.   Pulmonary:     Effort: No respiratory distress.     Breath sounds: Normal breath sounds.   Abdominal:     General: Bowel sounds are normal.     Palpations: Abdomen is soft. There is no mass.     Tenderness: There is no abdominal tenderness.  Musculoskeletal:     Cervical back: Normal range of motion and neck supple.       Assessment & Plan:   Gail was seen today for medical management of chronic issues.  Diagnoses and all orders for this visit:  Essential hypertension  Gastroesophageal reflux disease without esophagitis -     sucralfate (CARAFATE) 1 g tablet; TAKE 1 TABLET FOUR TIMES DAILY BEFORE MEALS & AT BEDTIME -     pantoprazole (PROTONIX) 40 MG tablet; Take 1 tablet (40 mg total) by mouth 2 (two) times daily.  Anxiety -     clonazePAM (KLONOPIN) 0.5 MG tablet; Take 0.5 tablets (0.25 mg total) by mouth daily as needed for anxiety.  Coronary artery disease involving native heart without angina pectoris, unspecified vessel or lesion type -     carvedilol (COREG) 6.25 MG tablet; TAKE 1 TABLET 2 TIMES A DAY WITH A MEAL -     atorvastatin (LIPITOR) 80 MG tablet; Take 1 tablet (80 mg total) by mouth at bedtime.  Other orders -     Tiotropium Bromide-Olodaterol (STIOLTO RESPIMAT) 2.5-2.5 MCG/ACT AERS; Inhale 2 puffs into the lungs daily. -     ondansetron (ZOFRAN-ODT) 8 MG disintegrating tablet; Take 1 tablet (8 mg total) by mouth every 6 (six) hours as needed for nausea or vomiting. -     ferrous sulfate (FEROSUL) 325 (65 FE) MG tablet; Take 1 tablet (325 mg total) by mouth daily with breakfast. -     albuterol (VENTOLIN HFA) 108 (90 Base) MCG/ACT inhaler; Inhale 2 puffs into the lungs every 6 (six) hours as needed for wheezing or shortness of breath. -     Pneumococcal polysaccharide vaccine 23-valent greater than or equal to 2yo subcutaneous/IM -     sertraline (ZOLOFT) 25 MG tablet; Take 1 tablet (25 mg total) by mouth daily. At bedtime for anxiety       I have discontinued Youlanda Roys. Evinger's escitalopram. I have also changed his Stiolto Respimat,  pantoprazole, and albuterol. Additionally, I am having him start on sertraline. Lastly, I am having him maintain his acetaminophen, vitamin B-12, fluticasone, loratadine, aspirin EC, lisinopril, hydroxyurea, sucralfate, ondansetron, ferrous sulfate, clonazePAM, carvedilol, and atorvastatin.  Allergies as of 05/15/2020      Reactions   Duloxetine Hcl Nausea And Vomiting   Other Other (See Comments)      Medication List       Accurate as of May 15, 2020  3:48 PM. If you have any questions, ask your nurse or doctor.        STOP taking these medications   escitalopram 10 MG tablet Commonly known as: LEXAPRO Stopped by: Claretta Fraise, MD     TAKE these medications   acetaminophen 500  MG tablet Commonly known as: TYLENOL Take 500 mg by mouth daily as needed for headache (pain).   albuterol 108 (90 Base) MCG/ACT inhaler Commonly known as: VENTOLIN HFA Inhale 2 puffs into the lungs every 6 (six) hours as needed for wheezing or shortness of breath. What changed: See the new instructions. Changed by: Claretta Fraise, MD   aspirin EC 81 MG tablet Take 81 mg by mouth at bedtime.   atorvastatin 80 MG tablet Commonly known as: LIPITOR Take 1 tablet (80 mg total) by mouth at bedtime.   carvedilol 6.25 MG tablet Commonly known as: COREG TAKE 1 TABLET 2 TIMES A DAY WITH A MEAL   clonazePAM 0.5 MG tablet Commonly known as: KLONOPIN Take 0.5 tablets (0.25 mg total) by mouth daily as needed for anxiety.   ferrous sulfate 325 (65 FE) MG tablet Commonly known as: FeroSul Take 1 tablet (325 mg total) by mouth daily with breakfast.   fluticasone 50 MCG/ACT nasal spray Commonly known as: FLONASE Place 2 sprays into both nostrils daily as needed (sinus headache).   hydroxyurea 500 MG capsule Commonly known as: HYDREA Take 1 capsule (500 mg total) by mouth daily. May take with food to minimize GI side effects.   lisinopril 20 MG tablet Commonly known as: ZESTRIL Take 1 tablet (20  mg total) by mouth daily.   loratadine 10 MG tablet Commonly known as: CLARITIN Take 10 mg by mouth daily as needed (seasonall allergies).   ondansetron 8 MG disintegrating tablet Commonly known as: ZOFRAN-ODT Take 1 tablet (8 mg total) by mouth every 6 (six) hours as needed for nausea or vomiting.   pantoprazole 40 MG tablet Commonly known as: PROTONIX Take 1 tablet (40 mg total) by mouth 2 (two) times daily. What changed: See the new instructions. Changed by: Claretta Fraise, MD   sertraline 25 MG tablet Commonly known as: ZOLOFT Take 1 tablet (25 mg total) by mouth daily. At bedtime for anxiety Started by: Claretta Fraise, MD   Stiolto Respimat 2.5-2.5 MCG/ACT Aers Generic drug: Tiotropium Bromide-Olodaterol Inhale 2 puffs into the lungs daily.   sucralfate 1 g tablet Commonly known as: CARAFATE TAKE 1 TABLET FOUR TIMES DAILY BEFORE MEALS & AT BEDTIME   vitamin B-12 1000 MCG tablet Commonly known as: CYANOCOBALAMIN Take 1,000 mcg by mouth daily.      Patient states he has a 65 year old friend who was recently diagnosed with lung cancer.  This is prompted him to want to quit.  We discussed this briefly he is trying to do this on his own for now.  We will work with him on smoking cessation.  Follow-up as needed for that.  He was encouraged to go forward with this effort of course.  Follow-up: Return in about 6 months (around 11/12/2020).  Claretta Fraise, M.D.

## 2020-05-25 IMAGING — CT CT ABD-PELV W/ CM
2 of 4 series · 16 of 46 positions shown, 18 images · IV contrast (Isovue)
Comparison: CT of the abdomen pelvis dated 10/07/2013

CLINICAL DATA: 67-year-old male with abdominal distention.

EXAM:
CT ABDOMEN AND PELVIS WITH CONTRAST
TECHNIQUE: Multidetector CT imaging of the abdomen and pelvis was performed
using the standard protocol following bolus administration of
intravenous contrast.
CONTRAST:  75mL OMNIPAQUE IOHEXOL 300 MG/ML  SOLN

[Series 2: axial st · axial · 0.75mm/px · z∈[+898,+1228]mm · 13 of 73 slices shown, 15 images]
[im 4/73  soft-tissue]
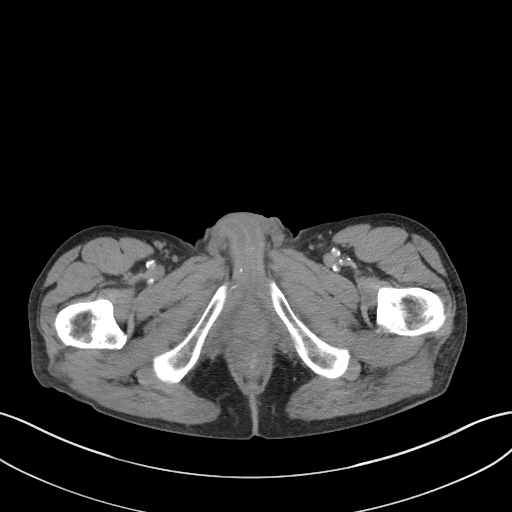
[im 4/73  bone]
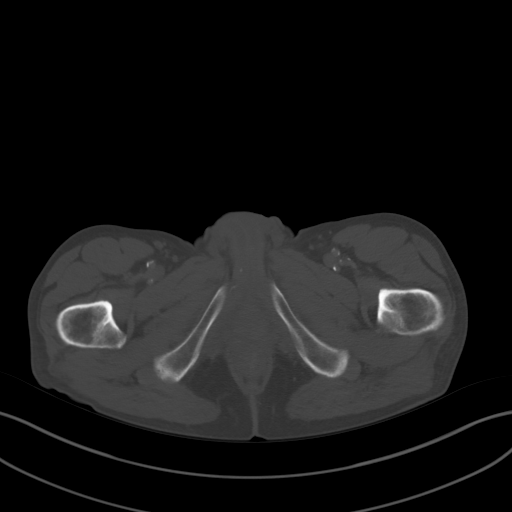
[im 10/73  soft-tissue]
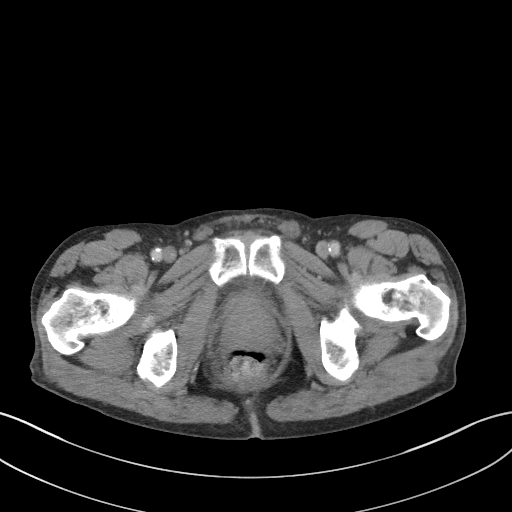
[im 16/73  soft-tissue]
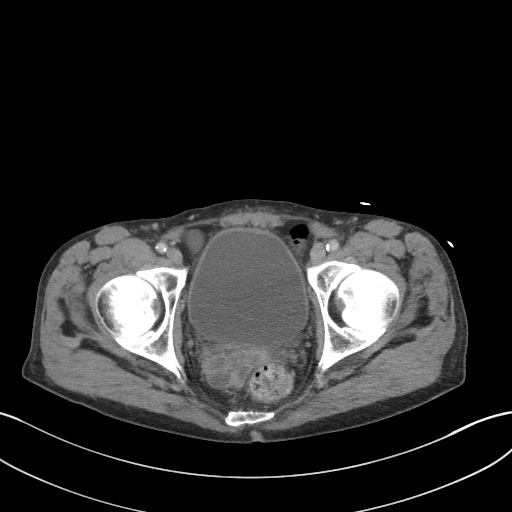
[im 22/73  soft-tissue]
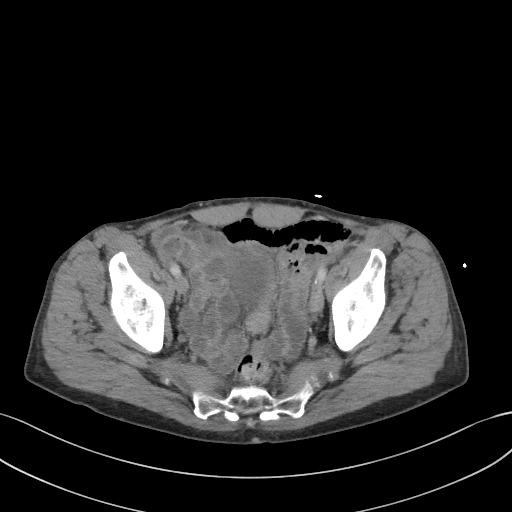
[im 25/73  soft-tissue]
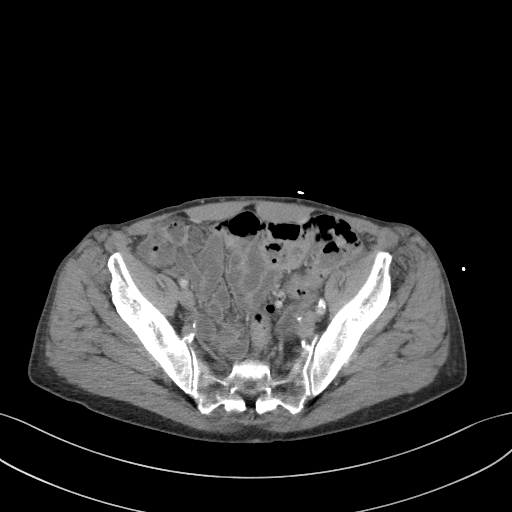
[im 31/73  soft-tissue]
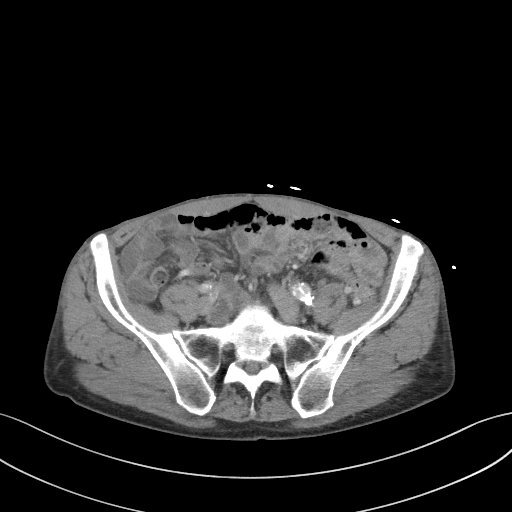
[im 37/73  soft-tissue]
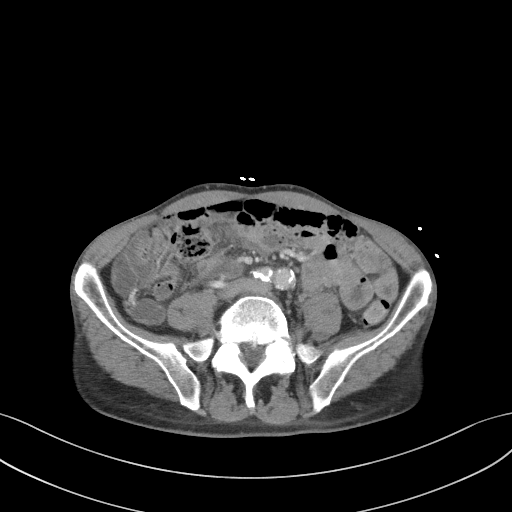
[im 43/73  soft-tissue]
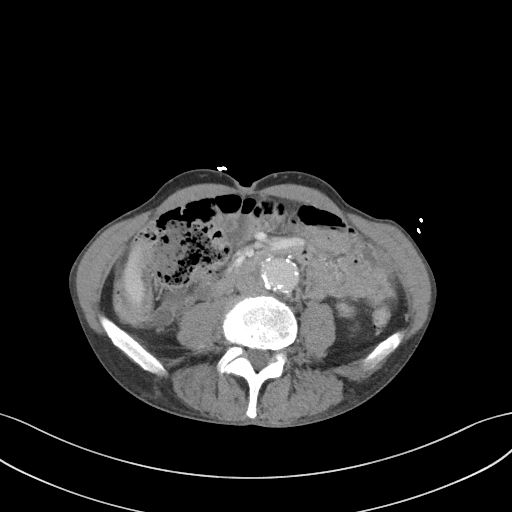
[im 49/73  soft-tissue]
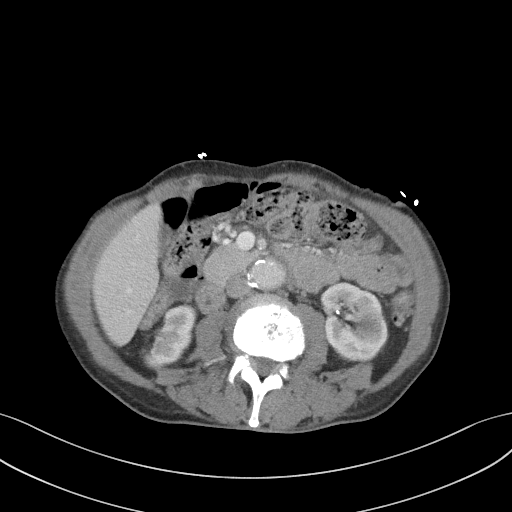
[im 49/73  bone]
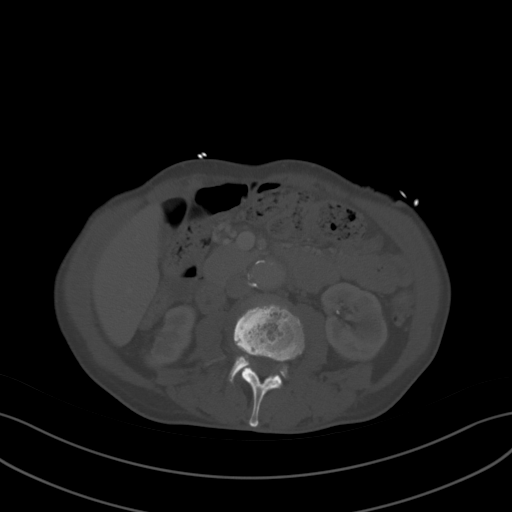
[im 52/73  soft-tissue]
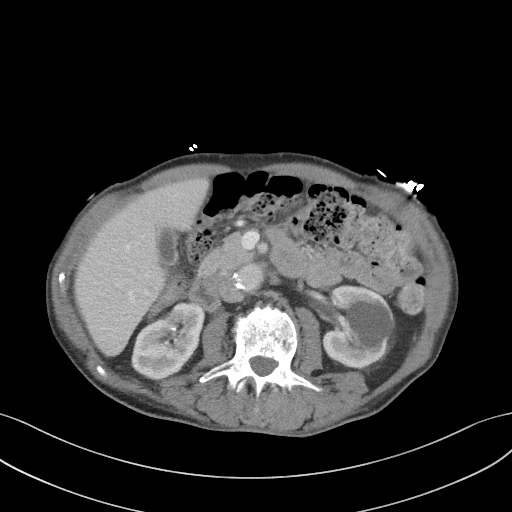
[im 58/73  soft-tissue]
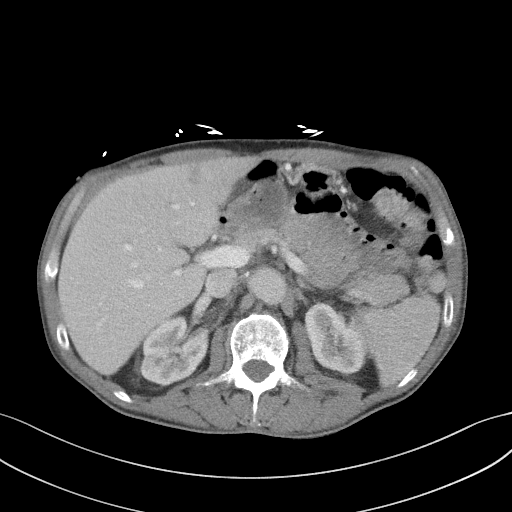
[im 64/73  soft-tissue]
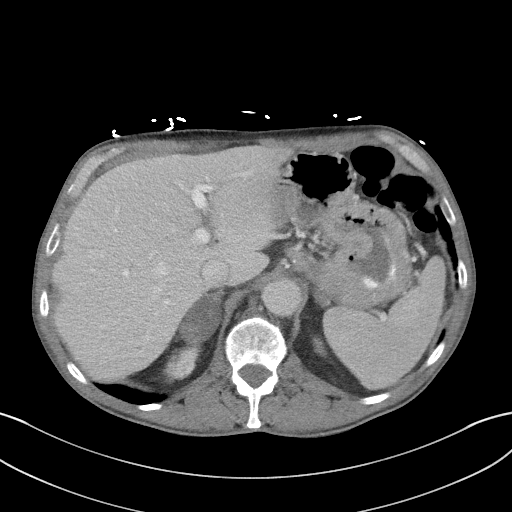
[im 70/73  soft-tissue]
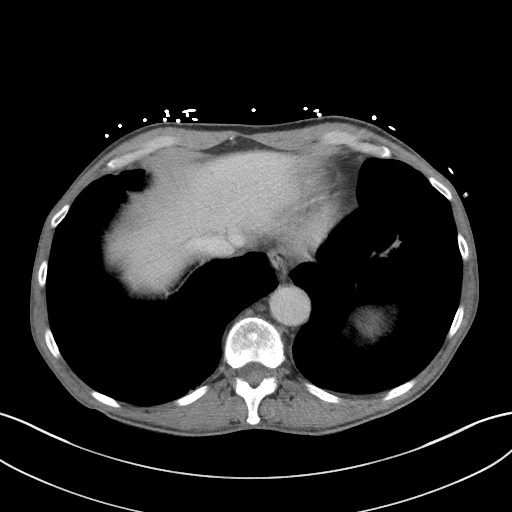

[Series 5: coronal st · coronal · 0.68mm/px · 3 of 74 slices shown]
[im 25/74  soft-tissue]
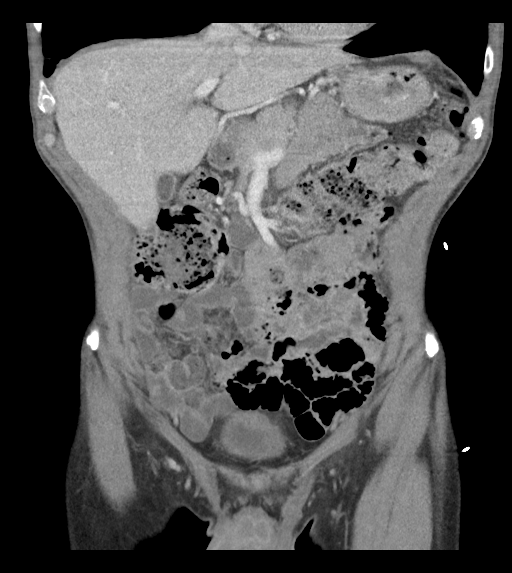
[im 33/74  soft-tissue]
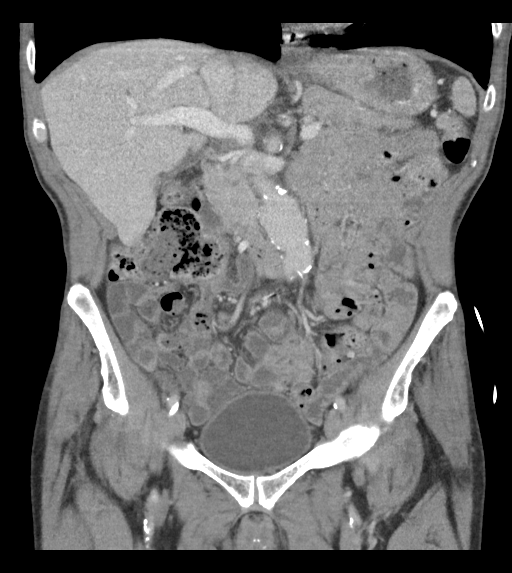
[im 41/74  soft-tissue]
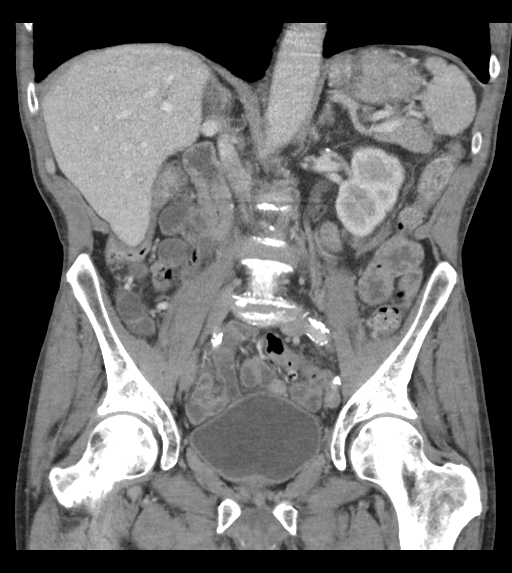

[16 of 46 positions shown; findings below may reference images not displayed]

FINDINGS: Evaluation of this exam is limited in the absence of intravenous
contrast.

Lower chest: There is emphysematous changes of the visualized lung
bases with areas of scarring. Coronary vascular calcification noted.

No intra-abdominal free air or free fluid.

Hepatobiliary: The liver is unremarkable. Small scattered hepatic
hypodensities are too small to characterize. No intrahepatic biliary
ductal dilatation. The gallbladder is unremarkable.

Pancreas: Unremarkable. No pancreatic ductal dilatation or
surrounding inflammatory changes.

Spleen: Normal in size without focal abnormality.

Adrenals/Urinary Tract: Stable 2.5 x 4.0 cm right adrenal lesion not
characterized on this CT but similar to prior CT and previously
characterized as adenoma. The left adrenal gland is unremarkable.
Stable 3.5 cm left renal interpolar cyst as well as an area of
cortical scarring along the lateral aspect of the inferior pole of
the right kidney. Vascular calcification versus less likely small
nonobstructing bilateral renal calculi. No hydronephrosis. The
urinary bladder is grossly unremarkable.

Stomach/Bowel: There is sigmoid diverticulosis without active
inflammatory changes. There is moderate stool throughout the colon.
No bowel obstruction or active inflammation. The appendix is not
visualized with certainty. No inflammatory changes identified in the
right lower quadrant.

Vascular/Lymphatic: Moderate aortoiliac atherosclerotic disease.
There is a 3.3 cm infrarenal aortic aneurysm increased since the
prior CT (previously measuring approximately 3 cm). The IVC is
unremarkable as visualized. No portal venous gas. There is no
adenopathy.

Reproductive: Mildly enlarged prostate gland measuring 4.5 cm in
transverse diameter.

Other: None

Musculoskeletal: Degenerative changes of the spine. Grade 1 L5-S1
retrolisthesis. No acute osseous pathology.
IMPRESSION: 1. No acute intra-abdominal or pelvic pathology.
2. A 3.3 cm infrarenal aortic aneurysm. Recommend followup by
ultrasound in 3 years. This recommendation follows ACR consensus
guidelines: White Paper of the ACR Incidental Findings Committee II
3. Moderate Aortic Atherosclerosis (3HG4H-K29.9).

## 2020-05-29 DIAGNOSIS — M9902 Segmental and somatic dysfunction of thoracic region: Secondary | ICD-10-CM | POA: Diagnosis not present

## 2020-05-29 DIAGNOSIS — M9903 Segmental and somatic dysfunction of lumbar region: Secondary | ICD-10-CM | POA: Diagnosis not present

## 2020-05-29 DIAGNOSIS — M9901 Segmental and somatic dysfunction of cervical region: Secondary | ICD-10-CM | POA: Diagnosis not present

## 2020-05-29 DIAGNOSIS — M5137 Other intervertebral disc degeneration, lumbosacral region: Secondary | ICD-10-CM | POA: Diagnosis not present

## 2020-06-05 DIAGNOSIS — M9902 Segmental and somatic dysfunction of thoracic region: Secondary | ICD-10-CM | POA: Diagnosis not present

## 2020-06-05 DIAGNOSIS — M9901 Segmental and somatic dysfunction of cervical region: Secondary | ICD-10-CM | POA: Diagnosis not present

## 2020-06-05 DIAGNOSIS — M5137 Other intervertebral disc degeneration, lumbosacral region: Secondary | ICD-10-CM | POA: Diagnosis not present

## 2020-06-05 DIAGNOSIS — M9903 Segmental and somatic dysfunction of lumbar region: Secondary | ICD-10-CM | POA: Diagnosis not present

## 2020-06-19 DIAGNOSIS — M9901 Segmental and somatic dysfunction of cervical region: Secondary | ICD-10-CM | POA: Diagnosis not present

## 2020-06-19 DIAGNOSIS — M9903 Segmental and somatic dysfunction of lumbar region: Secondary | ICD-10-CM | POA: Diagnosis not present

## 2020-06-19 DIAGNOSIS — M5137 Other intervertebral disc degeneration, lumbosacral region: Secondary | ICD-10-CM | POA: Diagnosis not present

## 2020-06-19 DIAGNOSIS — M9902 Segmental and somatic dysfunction of thoracic region: Secondary | ICD-10-CM | POA: Diagnosis not present

## 2020-07-03 ENCOUNTER — Telehealth: Payer: Self-pay

## 2020-07-03 NOTE — Telephone Encounter (Signed)
I reviewed last note - did not see anything.  Do you know what test patient is referring to? Please advise and send back to the pools.

## 2020-07-04 NOTE — Telephone Encounter (Signed)
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 He and his cardiologist can decide which version of that is best for him.

## 2020-07-04 NOTE — Telephone Encounter (Signed)
Spouse aware.

## 2020-07-17 DIAGNOSIS — M5137 Other intervertebral disc degeneration, lumbosacral region: Secondary | ICD-10-CM | POA: Diagnosis not present

## 2020-07-17 DIAGNOSIS — M9902 Segmental and somatic dysfunction of thoracic region: Secondary | ICD-10-CM | POA: Diagnosis not present

## 2020-07-17 DIAGNOSIS — M9903 Segmental and somatic dysfunction of lumbar region: Secondary | ICD-10-CM | POA: Diagnosis not present

## 2020-07-17 DIAGNOSIS — M9901 Segmental and somatic dysfunction of cervical region: Secondary | ICD-10-CM | POA: Diagnosis not present

## 2020-07-31 DIAGNOSIS — M9902 Segmental and somatic dysfunction of thoracic region: Secondary | ICD-10-CM | POA: Diagnosis not present

## 2020-07-31 DIAGNOSIS — M9901 Segmental and somatic dysfunction of cervical region: Secondary | ICD-10-CM | POA: Diagnosis not present

## 2020-07-31 DIAGNOSIS — M9903 Segmental and somatic dysfunction of lumbar region: Secondary | ICD-10-CM | POA: Diagnosis not present

## 2020-07-31 DIAGNOSIS — M5137 Other intervertebral disc degeneration, lumbosacral region: Secondary | ICD-10-CM | POA: Diagnosis not present

## 2020-08-07 ENCOUNTER — Other Ambulatory Visit: Payer: Self-pay | Admitting: Family Medicine

## 2020-08-07 DIAGNOSIS — M9903 Segmental and somatic dysfunction of lumbar region: Secondary | ICD-10-CM | POA: Diagnosis not present

## 2020-08-07 DIAGNOSIS — M5137 Other intervertebral disc degeneration, lumbosacral region: Secondary | ICD-10-CM | POA: Diagnosis not present

## 2020-08-07 DIAGNOSIS — M9902 Segmental and somatic dysfunction of thoracic region: Secondary | ICD-10-CM | POA: Diagnosis not present

## 2020-08-07 DIAGNOSIS — M9901 Segmental and somatic dysfunction of cervical region: Secondary | ICD-10-CM | POA: Diagnosis not present

## 2020-08-24 ENCOUNTER — Encounter: Payer: Self-pay | Admitting: Family Medicine

## 2020-08-28 DIAGNOSIS — M9901 Segmental and somatic dysfunction of cervical region: Secondary | ICD-10-CM | POA: Diagnosis not present

## 2020-08-28 DIAGNOSIS — M5137 Other intervertebral disc degeneration, lumbosacral region: Secondary | ICD-10-CM | POA: Diagnosis not present

## 2020-08-28 DIAGNOSIS — M9903 Segmental and somatic dysfunction of lumbar region: Secondary | ICD-10-CM | POA: Diagnosis not present

## 2020-08-28 DIAGNOSIS — M9902 Segmental and somatic dysfunction of thoracic region: Secondary | ICD-10-CM | POA: Diagnosis not present

## 2020-09-04 ENCOUNTER — Encounter: Payer: Self-pay | Admitting: Family Medicine

## 2020-09-04 ENCOUNTER — Ambulatory Visit (INDEPENDENT_AMBULATORY_CARE_PROVIDER_SITE_OTHER): Payer: Medicare Other | Admitting: Family Medicine

## 2020-09-04 ENCOUNTER — Other Ambulatory Visit: Payer: Self-pay

## 2020-09-04 VITALS — BP 142/89 | HR 83 | Temp 98.7°F | Ht 68.0 in | Wt 121.6 lb

## 2020-09-04 DIAGNOSIS — I10 Essential (primary) hypertension: Secondary | ICD-10-CM | POA: Diagnosis not present

## 2020-09-04 DIAGNOSIS — Z0289 Encounter for other administrative examinations: Secondary | ICD-10-CM | POA: Diagnosis not present

## 2020-09-04 DIAGNOSIS — J449 Chronic obstructive pulmonary disease, unspecified: Secondary | ICD-10-CM

## 2020-09-04 DIAGNOSIS — F419 Anxiety disorder, unspecified: Secondary | ICD-10-CM

## 2020-09-04 DIAGNOSIS — I2510 Atherosclerotic heart disease of native coronary artery without angina pectoris: Secondary | ICD-10-CM

## 2020-09-04 DIAGNOSIS — Z79891 Long term (current) use of opiate analgesic: Secondary | ICD-10-CM | POA: Diagnosis not present

## 2020-09-04 MED ORDER — CLONAZEPAM 0.5 MG PO TABS: 0.2500 mg | ORAL_TABLET | Freq: Every day | ORAL | 5 refills | Status: DC | PRN

## 2020-09-04 MED ORDER — SERTRALINE HCL 100 MG PO TABS: 100.0000 mg | ORAL_TABLET | Freq: Every day | ORAL | 1 refills | Status: DC

## 2020-09-04 NOTE — Progress Notes (Signed)
Subjective:  Patient ID: Jonathan Rosales, male    DOB: April 01, 1951  Age: 70 y.o. MRN: [id number]  CC: Anxiety   HPI APRIL CARLYON presents for follow-up of elevated cholesterol. Doing well without complaints on current medication. Denies side effects of statin including myalgia and arthralgia and nausea. Also in today for liver function testing. Currently no chest pain, shortness of breath or other cardiovascular related symptoms noted.   presents for  follow-up of hypertension. Patient has no history of headache chest pain or shortness of breath or recent cough. Patient also denies symptoms of TIA such as focal numbness or weakness. Patient denies side effects from medication. States taking it regularly.  Still smoking. COPD followed by [facility]. Getting CT screening there. Now at 1/2 carton a week.   History Kahmari has a past medical history of Allergy, Anemia, Anxiety, Blood transfusion without reported diagnosis, Cataract, Chronic kidney disease, COPD (chronic obstructive pulmonary disease) ([place]), Coronary artery disease, Emphysema of lung ([place]), and Hypertension.   He has a past surgical history that includes Cardiac surgery; Coronary artery bypass graft; and Repair of perforated ulcer (2017).   His family history includes Alcohol abuse in his brother; COPD in his mother; Depression in his mother; Diabetes in his mother; Heart disease in his father; Kidney disease in his mother.He reports that he has been smoking cigarettes. He has been smoking about 1.00 pack per day. He has never used smokeless tobacco. He reports that he does not drink alcohol and does not use drugs.  Current Outpatient Medications on File Prior to Visit  Medication Sig Dispense Refill  . acetaminophen (TYLENOL) 500 MG tablet Take 500 mg by mouth daily as needed for headache (pain).     Marland Kitchen albuterol (VENTOLIN HFA) 108 (90 Base) MCG/ACT inhaler Inhale 2 puffs into the lungs every 6 (six) hours as needed for  wheezing or shortness of breath. 8.5 g 1  . aspirin EC 81 MG tablet Take 81 mg by mouth at bedtime.     Marland Kitchen atorvastatin (LIPITOR) 80 MG tablet Take 1 tablet (80 mg total) by mouth at bedtime. 90 tablet 1  . carvedilol (COREG) 6.25 MG tablet TAKE 1 TABLET 2 TIMES A DAY WITH A MEAL 180 tablet 1  . ferrous sulfate (FEROSUL) 325 (65 FE) MG tablet Take 1 tablet (325 mg total) by mouth daily with breakfast. 90 tablet 1  . fluticasone (FLONASE) 50 MCG/ACT nasal spray Place 2 sprays into both nostrils daily as needed (sinus headache).     . hydroxyurea (HYDREA) 500 MG capsule Take 1 capsule (500 mg total) by mouth daily. May take with food to minimize GI side effects. 90 capsule 3  . lisinopril (ZESTRIL) 20 MG tablet Take 1 tablet (20 mg total) by mouth daily. 90 tablet 3  . loratadine (CLARITIN) 10 MG tablet Take 10 mg by mouth daily as needed (seasonall allergies).     . ondansetron (ZOFRAN-ODT) 8 MG disintegrating tablet Take 1 tablet (8 mg total) by mouth every 6 (six) hours as needed for nausea or vomiting. 20 tablet 1  . pantoprazole (PROTONIX) 40 MG tablet Take 1 tablet (40 mg total) by mouth 2 (two) times daily. 180 tablet 1  . STIOLTO RESPIMAT 2.5-2.5 MCG/ACT AERS Inhale 2 puffs into the lungs daily. 4 g 2  . sucralfate (CARAFATE) 1 g tablet TAKE 1 TABLET FOUR TIMES DAILY BEFORE MEALS & AT BEDTIME 360 tablet 1  . vitamin B-12 (CYANOCOBALAMIN) 1000 MCG tablet Take 1,000 mcg  by mouth daily.     No current facility-administered medications on file prior to visit.    ROS Review of Systems  Constitutional: Negative for fever.  Respiratory: Positive for shortness of breath (stable currently. Using [employer] [facility] follows for COPD).   Cardiovascular: Negative for chest pain.  Musculoskeletal: Negative for arthralgias.  Skin: Negative for rash.    Objective:  BP (!) 142/89   Pulse 83   Temp 98.7 F (37.1 C)   Ht 5' 8" (1.727 m)   Wt 121 lb 9.6 oz (55.2 kg)   SpO2 97%   BMI 18.49  kg/m   BP Readings from Last 3 Encounters:  09/04/20 (!) 142/89  05/15/20 139/81  01/24/20 127/86    Wt Readings from Last 3 Encounters:  09/04/20 121 lb 9.6 oz (55.2 kg)  05/15/20 128 lb 6.4 oz (58.2 kg)  01/24/20 126 lb (57.2 kg)     Physical Exam Vitals reviewed.  Constitutional:      Appearance: He is well-developed.  HENT:     Head: Normocephalic and atraumatic.     Right Ear: Tympanic membrane and external ear normal. No decreased hearing noted.     Left Ear: Tympanic membrane and external ear normal. No decreased hearing noted.     Mouth/Throat:     Pharynx: No oropharyngeal exudate or posterior oropharyngeal erythema.  Eyes:     Pupils: Pupils are equal, round, and reactive to light.  Cardiovascular:     Rate and Rhythm: Normal rate and regular rhythm.     Heart sounds: No murmur heard.   Pulmonary:     Effort: Pulmonary effort is normal. No respiratory distress.     Breath sounds: No wheezing, rhonchi or rales.     Comments: Distant sounds, decreased expiratory phase  Abdominal:     General: Bowel sounds are normal.     Palpations: Abdomen is soft. There is no mass.     Tenderness: There is no abdominal tenderness.  Musculoskeletal:     Cervical back: Normal range of motion and neck supple.     No results found for: HGBA1C  Lab Results  Component Value Date   WBC 10.9 (H) 12/20/2019   HGB 15.3 12/20/2019   HCT 46.1 12/20/2019   PLT 489 (H) 12/20/2019   GLUCOSE 82 10/25/2019   CHOL 129 10/25/2019   TRIG 127 10/25/2019   HDL 43 10/25/2019   LDLCALC 63 10/25/2019   ALT 11 10/25/2019   AST 16 10/25/2019   NA 143 10/25/2019   K 5.1 10/25/2019   CL 101 10/25/2019   CREATININE 1.43 (H) 10/25/2019   BUN 19 10/25/2019   CO2 25 10/25/2019    NM Myocar Multi W/Spect W/Wall Motion / EF  Result Date: 01/18/2019  Defect 1: There is a medium defect of moderate severity present in the mid inferoseptal, mid inferior, mid inferolateral and apical  inferior location.  Findings consistent with prior myocardial infarction with a mild degree of peri-infarct ischemia.  This is a low to intermediate risk study.  Nuclear stress EF: 56%.  Diffuse nonspecific ST segment depressions seen throughout study.     Assessment & Plan:   Santanna was seen today for anxiety.  Diagnoses and all orders for this visit:  Anxiety -     clonazePAM (KLONOPIN) 0.5 MG tablet; Take 0.5 tablets (0.25 mg total) by mouth daily as needed for anxiety. -     sertraline (ZOLOFT) 100 MG tablet; Take 1 tablet (100 mg  total) by mouth daily. At bedtime for anxiety  Essential hypertension -     CBC with Differential/Platelet -     CMP14+EGFR -     Lipid panel  Coronary artery disease involving native heart without angina pectoris, unspecified vessel or lesion type -     CBC with Differential/Platelet -     CMP14+EGFR -     Lipid panel  Chronic obstructive pulmonary disease, unspecified COPD type (HCC) -     CBC with Differential/Platelet -     CMP14+EGFR   I have changed Youlanda Roys. Santarelli's sertraline. I am also having him maintain his acetaminophen, vitamin B-12, fluticasone, loratadine, aspirin EC, lisinopril, hydroxyurea, sucralfate, pantoprazole, ondansetron, ferrous sulfate, carvedilol, atorvastatin, albuterol, Stiolto Respimat, and clonazePAM.  Meds ordered this encounter  Medications  . clonazePAM (KLONOPIN) 0.5 MG tablet    Sig: Take 0.5 tablets (0.25 mg total) by mouth daily as needed for anxiety.    Dispense:  15 tablet    Refill:  5  . sertraline (ZOLOFT) 100 MG tablet    Sig: Take 1 tablet (100 mg total) by mouth daily. At bedtime for anxiety    Dispense:  90 tablet    Refill:  1   Smoking cessation encouraged.  Techniques/strategies reviewed.  Also I encouraged the patient to cut back on the clonazepam.  His sertraline was increased today since he said it was not helping.  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  Follow-up: Return in about 6 months (around 03/07/2021).  Claretta Fraise, M.D.

## 2020-09-04 NOTE — Addendum Note (Signed)
Addended by: Baldomero Lamy B on: 09/04/2020 10:47 AM   Modules accepted: Orders

## 2020-09-05 DIAGNOSIS — H5203 Hypermetropia, bilateral: Secondary | ICD-10-CM | POA: Diagnosis not present

## 2020-09-11 DIAGNOSIS — M9903 Segmental and somatic dysfunction of lumbar region: Secondary | ICD-10-CM | POA: Diagnosis not present

## 2020-09-11 DIAGNOSIS — M9901 Segmental and somatic dysfunction of cervical region: Secondary | ICD-10-CM | POA: Diagnosis not present

## 2020-09-11 DIAGNOSIS — M5137 Other intervertebral disc degeneration, lumbosacral region: Secondary | ICD-10-CM | POA: Diagnosis not present

## 2020-09-11 DIAGNOSIS — M9902 Segmental and somatic dysfunction of thoracic region: Secondary | ICD-10-CM | POA: Diagnosis not present

## 2020-09-12 LAB — TOXASSURE SELECT 13 (MW), URINE

## 2020-09-17 ENCOUNTER — Other Ambulatory Visit: Payer: Self-pay

## 2020-09-17 ENCOUNTER — Encounter (HOSPITAL_COMMUNITY): Payer: Self-pay

## 2020-09-17 ENCOUNTER — Emergency Department (HOSPITAL_COMMUNITY)
Admission: EM | Admit: 2020-09-17 | Discharge: 2020-09-18 | Disposition: A | Discharge status: Home / Self Care | Payer: Medicare Other | Attending: Emergency Medicine | Admitting: Emergency Medicine

## 2020-09-17 DIAGNOSIS — R911 Solitary pulmonary nodule: Secondary | ICD-10-CM | POA: Diagnosis not present

## 2020-09-17 DIAGNOSIS — J441 Chronic obstructive pulmonary disease with (acute) exacerbation: Secondary | ICD-10-CM | POA: Insufficient documentation

## 2020-09-17 DIAGNOSIS — N183 Chronic kidney disease, stage 3 unspecified: Secondary | ICD-10-CM | POA: Insufficient documentation

## 2020-09-17 DIAGNOSIS — Z743 Need for continuous supervision: Secondary | ICD-10-CM | POA: Diagnosis not present

## 2020-09-17 DIAGNOSIS — Z7982 Long term (current) use of aspirin: Secondary | ICD-10-CM | POA: Insufficient documentation

## 2020-09-17 DIAGNOSIS — Z951 Presence of aortocoronary bypass graft: Secondary | ICD-10-CM | POA: Diagnosis not present

## 2020-09-17 DIAGNOSIS — Z79899 Other long term (current) drug therapy: Secondary | ICD-10-CM | POA: Diagnosis not present

## 2020-09-17 DIAGNOSIS — R069 Unspecified abnormalities of breathing: Secondary | ICD-10-CM | POA: Diagnosis not present

## 2020-09-17 DIAGNOSIS — I2510 Atherosclerotic heart disease of native coronary artery without angina pectoris: Secondary | ICD-10-CM | POA: Diagnosis not present

## 2020-09-17 DIAGNOSIS — F17210 Nicotine dependence, cigarettes, uncomplicated: Secondary | ICD-10-CM | POA: Insufficient documentation

## 2020-09-17 DIAGNOSIS — I499 Cardiac arrhythmia, unspecified: Secondary | ICD-10-CM | POA: Diagnosis not present

## 2020-09-17 DIAGNOSIS — R0602 Shortness of breath: Secondary | ICD-10-CM | POA: Diagnosis not present

## 2020-09-17 DIAGNOSIS — Z7951 Long term (current) use of inhaled steroids: Secondary | ICD-10-CM | POA: Insufficient documentation

## 2020-09-17 DIAGNOSIS — J439 Emphysema, unspecified: Secondary | ICD-10-CM | POA: Diagnosis not present

## 2020-09-17 DIAGNOSIS — I129 Hypertensive chronic kidney disease with stage 1 through stage 4 chronic kidney disease, or unspecified chronic kidney disease: Secondary | ICD-10-CM | POA: Insufficient documentation

## 2020-09-17 NOTE — ED Triage Notes (Signed)
BIB EMS c/o shortness of breath all day. Hx of COPD. Albuterol, duoneb, and 125 solu-medrol given PTA.

## 2020-09-18 ENCOUNTER — Emergency Department (HOSPITAL_COMMUNITY): Payer: Medicare Other

## 2020-09-18 DIAGNOSIS — R0602 Shortness of breath: Secondary | ICD-10-CM | POA: Diagnosis not present

## 2020-09-18 DIAGNOSIS — F17210 Nicotine dependence, cigarettes, uncomplicated: Secondary | ICD-10-CM | POA: Diagnosis not present

## 2020-09-18 DIAGNOSIS — R911 Solitary pulmonary nodule: Secondary | ICD-10-CM | POA: Diagnosis not present

## 2020-09-18 DIAGNOSIS — J439 Emphysema, unspecified: Secondary | ICD-10-CM | POA: Diagnosis not present

## 2020-09-18 LAB — CBC WITH DIFFERENTIAL/PLATELET
Abs Immature Granulocytes: 0.04 10*3/uL (ref 0.00–0.07)
Basophils Absolute: 0.1 10*3/uL (ref 0.0–0.1)
Basophils Relative: 1 %
Eosinophils Absolute: 0.1 10*3/uL (ref 0.0–0.5)
Eosinophils Relative: 1 %
HCT: 41.5 % (ref 39.0–52.0)
Hemoglobin: 13.9 g/dL (ref 13.0–17.0)
Immature Granulocytes: 0 %
Lymphocytes Relative: 8 %
Lymphs Abs: 1 10*3/uL (ref 0.7–4.0)
MCH: 37.5 pg — ABNORMAL HIGH (ref 26.0–34.0)
MCHC: 33.5 g/dL (ref 30.0–36.0)
MCV: 111.9 fL — ABNORMAL HIGH (ref 80.0–100.0)
Monocytes Absolute: 1.3 10*3/uL — ABNORMAL HIGH (ref 0.1–1.0)
Monocytes Relative: 11 %
Neutro Abs: 9.8 10*3/uL — ABNORMAL HIGH (ref 1.7–7.7)
Neutrophils Relative %: 79 %
Platelets: 301 10*3/uL (ref 150–400)
RBC: 3.71 MIL/uL — ABNORMAL LOW (ref 4.22–5.81)
RDW: 14.2 % (ref 11.5–15.5)
WBC: 12.4 10*3/uL — ABNORMAL HIGH (ref 4.0–10.5)
nRBC: 0 % (ref 0.0–0.2)

## 2020-09-18 LAB — COMPREHENSIVE METABOLIC PANEL
ALT: 16 U/L (ref 0–44)
AST: 13 U/L — ABNORMAL LOW (ref 15–41)
Albumin: 3.5 g/dL (ref 3.5–5.0)
Alkaline Phosphatase: 66 U/L (ref 38–126)
Anion gap: 7 (ref 5–15)
BUN: 15 mg/dL (ref 8–23)
CO2: 26 mmol/L (ref 22–32)
Calcium: 8.7 mg/dL — ABNORMAL LOW (ref 8.9–10.3)
Chloride: 100 mmol/L (ref 98–111)
Creatinine, Ser: 1 mg/dL (ref 0.61–1.24)
GFR, Estimated: >60 mL/min (ref >60)
Glucose, Bld: 100 mg/dL — ABNORMAL HIGH (ref 70–99)
Potassium: 4 mmol/L (ref 3.5–5.1)
Sodium: 133 mmol/L — ABNORMAL LOW (ref 135–145)
Total Bilirubin: 0.9 mg/dL (ref 0.3–1.2)
Total Protein: 6.6 g/dL (ref 6.5–8.1)

## 2020-09-18 MED ORDER — DOXYCYCLINE HYCLATE 100 MG PO TABS
100.0000 mg | ORAL_TABLET | Freq: Once | ORAL | Status: AC
  Administered 2020-09-18: 100 mg via ORAL
  Filled 2020-09-18: qty 1

## 2020-09-18 MED ORDER — PREDNISONE 20 MG PO TABS: ORAL_TABLET | ORAL | 0 refills | Status: DC

## 2020-09-18 MED ORDER — DOXYCYCLINE HYCLATE 100 MG PO CAPS: 100.0000 mg | ORAL_CAPSULE | Freq: Two times a day (BID) | ORAL | 0 refills | Status: DC

## 2020-09-18 MED ORDER — IPRATROPIUM-ALBUTEROL 0.5-2.5 (3) MG/3ML IN SOLN
3.0000 mL | RESPIRATORY_TRACT | Status: AC
  Administered 2020-09-18 (×3): 3 mL via RESPIRATORY_TRACT
  Filled 2020-09-18 (×3): qty 3

## 2020-09-18 NOTE — ED Notes (Signed)
Pt maintained oxygen saturations of 90-91% while ambulating. EDP notified.

## 2020-09-18 NOTE — ED Provider Notes (Signed)
[facility] EMERGENCY DEPARTMENT Provider Note   CSN: [id number] Arrival date & time: 09/17/20  2342     History Chief Complaint  Patient presents with  . Shortness of Breath    Jonathan Rosales is a 70 y.o. male.  70 year old male with history of COPD without any acute worsening of shortness of breath today also associated with productive cough that was yellow phlegm.  Patient states this is similar to when he had pneumonia last year.  He tried albuterol at home and did not seem to get better.  Brought in by EMS with reported hypoxia.  Given some albuterol and steroids prior to arrival.  Patient without fever.  No lower extremity swelling.   Shortness of Breath      Past Medical History:  Diagnosis Date  . Allergy   . Anemia   . Anxiety   . Blood transfusion without reported diagnosis   . Cataract   . Chronic kidney disease   . COPD (chronic obstructive pulmonary disease) ([place])   . Coronary artery disease   . Emphysema of lung ([place])   . Hypertension     Patient Active Problem List   Diagnosis Date Noted  . Current smoker 10/25/2019  . Macrocytosis without anemia 12/15/2017  . Essential thrombocytosis ([place]) 12/15/2017  . CKD (chronic kidney disease), stage III ([place]) 11/17/2017  . Coronary artery disease involving native heart without angina pectoris 11/17/2017  . Essential hypertension 11/17/2017  . Chronic obstructive pulmonary disease ([place]) 11/17/2017  . History of ulcer disease 11/17/2017  . Gastroesophageal reflux disease without esophagitis 11/17/2017    Past Surgical History:  Procedure Laterality Date  . CARDIAC SURGERY    . CORONARY ARTERY BYPASS GRAFT    . REPAIR OF PERFORATED ULCER  2017       Family History  Problem Relation Age of Onset  . COPD Mother   . Depression Mother   . Diabetes Mother   . Kidney disease Mother   . Heart disease Father   . Alcohol abuse Brother     Social History   Tobacco Use  . Smoking status: Current  Every Day Smoker    Packs/day: 1.00    Types: Cigarettes  . Smokeless tobacco: Never Used  Vaping Use  . Vaping Use: Never used  Substance Use Topics  . Alcohol use: No  . Drug use: No    Home Medications Prior to Admission medications   Medication Sig Start Date End Date Taking? Authorizing Provider  doxycycline (VIBRAMYCIN) 100 MG capsule Take 1 capsule (100 mg total) by mouth 2 (two) times daily. One po bid x 7 days 09/18/20  Yes Gavan Nordby, Corene Cornea, MD  predniSONE (DELTASONE) 20 MG tablet 2 tabs po daily x 4 days 09/18/20  Yes Chasey Dull, Corene Cornea, MD  acetaminophen (TYLENOL) 500 MG tablet Take 500 mg by mouth daily as needed for headache (pain).     [provider]  albuterol (VENTOLIN HFA) 108 (90 Base) MCG/ACT inhaler Inhale 2 puffs into the lungs every 6 (six) hours as needed for wheezing or shortness of breath. 05/15/20   Claretta Fraise, MD  aspirin EC 81 MG tablet Take 81 mg by mouth at bedtime.     [provider]  atorvastatin (LIPITOR) 80 MG tablet Take 1 tablet (80 mg total) by mouth at bedtime. 05/15/20   Claretta Fraise, MD  carvedilol (COREG) 6.25 MG tablet TAKE 1 TABLET 2 TIMES A DAY WITH A MEAL 05/15/20   Claretta Fraise, MD  clonazePAM (  KLONOPIN) 0.5 MG tablet Take 0.5 tablets (0.25 mg total) by mouth daily as needed for anxiety. 09/04/20   Claretta Fraise, MD  ferrous sulfate (FEROSUL) 325 (65 FE) MG tablet Take 1 tablet (325 mg total) by mouth daily with breakfast. 05/15/20   Claretta Fraise, MD  fluticasone (FLONASE) 50 MCG/ACT nasal spray Place 2 sprays into both nostrils daily as needed (sinus headache).     [provider]  hydroxyurea (HYDREA) 500 MG capsule Take 1 capsule (500 mg total) by mouth daily. May take with food to minimize GI side effects. 12/20/19   Nicholas Lose, MD  lisinopril (ZESTRIL) 20 MG tablet Take 1 tablet (20 mg total) by mouth daily. 10/25/19   Claretta Fraise, MD  loratadine (CLARITIN) 10 MG tablet Take 10 mg by mouth daily as needed  (seasonall allergies).     [provider]  ondansetron (ZOFRAN-ODT) 8 MG disintegrating tablet Take 1 tablet (8 mg total) by mouth every 6 (six) hours as needed for nausea or vomiting. 05/15/20   Claretta Fraise, MD  pantoprazole (PROTONIX) 40 MG tablet Take 1 tablet (40 mg total) by mouth 2 (two) times daily. 05/15/20   Claretta Fraise, MD  sertraline (ZOLOFT) 100 MG tablet Take 1 tablet (100 mg total) by mouth daily. At bedtime for anxiety 09/04/20   Claretta Fraise, MD  STIOLTO RESPIMAT 2.5-2.5 MCG/ACT AERS Inhale 2 puffs into the lungs daily. 08/07/20   Claretta Fraise, MD  sucralfate (CARAFATE) 1 g tablet TAKE 1 TABLET FOUR TIMES DAILY BEFORE MEALS & AT BEDTIME 05/15/20   Claretta Fraise, MD  vitamin B-12 (CYANOCOBALAMIN) 1000 MCG tablet Take 1,000 mcg by mouth daily.    [provider]    Allergies    Duloxetine hcl and Other  Review of Systems   Review of Systems  Respiratory: Positive for shortness of breath.     Physical Exam Updated Vital Signs BP 100/72   Pulse 77   Temp 98.7 F (37.1 C)   Resp (!) 22   Ht 5\' 8"  (1.727 m)   Wt 57.6 kg   SpO2 (!) 89%   BMI 19.31 kg/m   Physical Exam Vitals and nursing note reviewed.  Constitutional:      Appearance: He is well-developed.  HENT:     Head: Normocephalic and atraumatic.  Cardiovascular:     Rate and Rhythm: Normal rate.  Pulmonary:     Effort: Pulmonary effort is normal. Tachypnea present. No respiratory distress.     Breath sounds: Decreased breath sounds and wheezing present. No rales.  Chest:     Chest wall: No mass.  Abdominal:     General: There is no distension.  Musculoskeletal:        General: Normal range of motion.     Cervical back: Normal range of motion.     Right lower leg: No edema.     Left lower leg: No edema.  Skin:    General: Skin is warm and dry.  Neurological:     General: No focal deficit present.     Mental Status: He is alert.     ED Results / Procedures / Treatments    Labs (all labs ordered are listed, but only abnormal results are displayed) Labs Reviewed  CBC WITH DIFFERENTIAL/PLATELET - Abnormal; Notable for the following components:      Result Value   WBC 12.4 (*)    RBC 3.71 (*)    MCV 111.9 (*)    MCH 37.5 (*)  Neutro Abs 9.8 (*)    Monocytes Absolute 1.3 (*)    All other components within normal limits  COMPREHENSIVE METABOLIC PANEL - Abnormal; Notable for the following components:   Sodium 133 (*)    Glucose, Bld 100 (*)    Calcium 8.7 (*)    AST 13 (*)    All other components within normal limits    EKG EKG Interpretation  Date/Time:  Sunday Sep 17 2020 23:48:31 EDT Ventricular Rate:  87 PR Interval:  166 QRS Duration: 108 QT Interval:  408 QTC Calculation: 491 R Axis:   12 Text Interpretation: Sinus rhythm Biatrial enlargement Minimal ST elevation, anterior leads Borderline prolonged QT interval similar to previous Confirmed by Sharnetta Gielow ([id number]) on 09/18/2020 3:18:16 AM   Radiology DG Chest Portable 1 View  Result Date: 09/18/2020 CLINICAL DATA:  Shortness of breath history of COPD, emphysema and prior CABG EXAM: PORTABLE CHEST 1 VIEW COMPARISON:  None. FINDINGS: Prior median sternotomy and CABG. The heart size and mediastinal contours are within normal limits. Aortic atherosclerosis. Pulmonary hyperinflation with coarse interstitial markings. Linear scarring in the left lung base. Scarring in the right lung base. A few right-sided pulmonary nodules measuring up to 9 mm. Degenerative change of the spine and bilateral acromioclavicular joints. IMPRESSION: 1. Pulmonary hyperinflation with coarse interstitial markings, consistent with COPD. 2. Right-sided pulmonary nodules measuring up to 9 mm, consider further evaluation with nonemergent chest CT. Electronically Signed   By: Jeffrey  Waltz MD   On: 09/18/2020 03:12    Procedures Procedures   Medications Ordered in ED Medications  ipratropium-albuterol (DUONEB) 0.5-2.5  (3) MG/3ML nebulizer solution 3 mL (3 mLs Nebulization Given 09/18/20 0049)  doxycycline (VIBRA-TABS) tablet 100 mg (100 mg Oral Given 09/18/20 0304)    ED Course  I have reviewed the triage vital signs and the nursing notes.  Pertinent labs & imaging results that were available during my care of the patient were reviewed by me and considered in my medical decision making (see chart for details).    MDM Rules/Calculators/A&P                          Likely COPD exacerbation so we will treat with antibiotics as well.  Did have a slightly low oxygen level when ambulating but patient states he feels much better.  Suspect he probably runs relatively low with a COPD anyway.  He is no longer tachypneic.  His lungs have opened up.  I think overall patient is much improved upon discharge compared to arrival.  After shared decision making I think the patient is stable for discharge with close PCP follow-up and strict return precautions.  Final Clinical Impression(s) / ED Diagnoses Final diagnoses:  COPD exacerbation (HCC)    Rx / DC Orders ED Discharge Orders         Ordered    doxycycline (VIBRAMYCIN) 100 MG capsule  2 times daily        09/18/20 0346    predniSONE (DELTASONE) 20 MG tablet        05 /16/22 0346           Zadin Lange, Corene Cornea, MD 09/18/20 [phone]

## 2020-09-25 DIAGNOSIS — R911 Solitary pulmonary nodule: Secondary | ICD-10-CM | POA: Diagnosis not present

## 2020-09-25 DIAGNOSIS — J439 Emphysema, unspecified: Secondary | ICD-10-CM | POA: Diagnosis not present

## 2020-09-25 DIAGNOSIS — R9389 Abnormal findings on diagnostic imaging of other specified body structures: Secondary | ICD-10-CM | POA: Diagnosis not present

## 2020-09-25 DIAGNOSIS — F17219 Nicotine dependence, cigarettes, with unspecified nicotine-induced disorders: Secondary | ICD-10-CM | POA: Diagnosis not present

## 2020-10-07 ENCOUNTER — Other Ambulatory Visit: Payer: Self-pay | Admitting: Family Medicine

## 2020-10-16 DIAGNOSIS — D509 Iron deficiency anemia, unspecified: Secondary | ICD-10-CM | POA: Diagnosis not present

## 2020-10-16 DIAGNOSIS — N1831 Chronic kidney disease, stage 3a: Secondary | ICD-10-CM | POA: Diagnosis not present

## 2020-10-16 DIAGNOSIS — I10 Essential (primary) hypertension: Secondary | ICD-10-CM | POA: Diagnosis not present

## 2020-10-30 ENCOUNTER — Other Ambulatory Visit: Payer: Self-pay

## 2020-10-30 ENCOUNTER — Ambulatory Visit (INDEPENDENT_AMBULATORY_CARE_PROVIDER_SITE_OTHER): Payer: Medicare Other | Admitting: Family Medicine

## 2020-10-30 ENCOUNTER — Encounter: Payer: Self-pay | Admitting: Family Medicine

## 2020-10-30 VITALS — BP 178/108 | HR 82 | Temp 97.7°F | Ht 68.0 in | Wt 118.2 lb

## 2020-10-30 DIAGNOSIS — N1831 Chronic kidney disease, stage 3a: Secondary | ICD-10-CM | POA: Diagnosis not present

## 2020-10-30 DIAGNOSIS — I2510 Atherosclerotic heart disease of native coronary artery without angina pectoris: Secondary | ICD-10-CM

## 2020-10-30 DIAGNOSIS — E782 Mixed hyperlipidemia: Secondary | ICD-10-CM

## 2020-10-30 DIAGNOSIS — I10 Essential (primary) hypertension: Secondary | ICD-10-CM | POA: Diagnosis not present

## 2020-10-30 DIAGNOSIS — K219 Gastro-esophageal reflux disease without esophagitis: Secondary | ICD-10-CM | POA: Diagnosis not present

## 2020-10-30 DIAGNOSIS — Z125 Encounter for screening for malignant neoplasm of prostate: Secondary | ICD-10-CM

## 2020-10-30 MED ORDER — SUCRALFATE 1 G PO TABS: ORAL_TABLET | ORAL | 3 refills | Status: DC

## 2020-10-30 MED ORDER — CARVEDILOL 6.25 MG PO TABS: ORAL_TABLET | ORAL | 3 refills | Status: DC

## 2020-10-30 MED ORDER — ATORVASTATIN CALCIUM 80 MG PO TABS: 80.0000 mg | ORAL_TABLET | Freq: Every day | ORAL | 3 refills | Status: DC

## 2020-10-30 MED ORDER — PANTOPRAZOLE SODIUM 40 MG PO TBEC: 40.0000 mg | DELAYED_RELEASE_TABLET | Freq: Two times a day (BID) | ORAL | 3 refills | Status: DC

## 2020-10-30 MED ORDER — ESCITALOPRAM OXALATE 10 MG PO TABS: 10.0000 mg | ORAL_TABLET | Freq: Every day | ORAL | 1 refills | Status: DC

## 2020-10-30 MED ORDER — FERROUS SULFATE 325 (65 FE) MG PO TABS: 325.0000 mg | ORAL_TABLET | Freq: Every day | ORAL | 3 refills | Status: DC

## 2020-10-30 NOTE — Progress Notes (Signed)
Subjective:  Patient ID: Jonathan Rosales, male    DOB: 1950-10-17  Age: 70 y.o. MRN: [id number]  CC: Medical Management of Chronic Issues   HPI Jonathan Rosales presents for running out of clonazepam. Didn't realize that he had several refills available.  Panic last night. Anxious. No chest pain  or dyspnea. Didn't sleep. Forgot to take BP meds this AM.  Readings at home are good, however. Kidney doctor said he is doing well. Gave him a 1 year follow up. Also ordered a CT Lung. Was normal. Smoking just 4-5 cigarettes on the weekend.    presents for  follow-up of hypertension. Patient has no history of headache chest pain or shortness of breath or recent cough. Patient also denies symptoms of TIA such as focal numbness or weakness. Patient denies side effects from medication. States taking it regularly.   in for follow-up of elevated cholesterol. Doing well without complaints on current medication. Denies side effects of statin including myalgia and arthralgia and nausea. Currently no chest pain, shortness of breath or other cardiovascular related symptoms noted.  GAD 7 : Generalized Anxiety Score 10/30/2020 09/04/2020  Nervous, Anxious, on Edge 1 0  Control/stop worrying 1 0  Worry too much - different things 1 1  Trouble relaxing 1 1  Restless 1 1  Easily annoyed or irritable 0 0  Afraid - awful might happen 0 0  Total GAD 7 Score 5 3  Anxiety Difficulty Somewhat difficult -     Depression screen [facility] 2/9 10/30/2020 09/04/2020 09/04/2020 05/15/2020 01/24/2020  Decreased Interest 0 0 0 0 0  Down, Depressed, Hopeless 0 0 0 0 0  PHQ - 2 Score 0 0 0 0 0  Altered sleeping - 1 - - -  Tired, decreased energy - 0 - - -  Change in appetite - 0 - - -  Feeling bad or failure about yourself  - 0 - - -  Trouble concentrating - 0 - - -  Moving slowly or fidgety/restless - 0 - - -  Suicidal thoughts - 0 - - -  PHQ-9 Score - 1 - - -  Difficult doing work/chores - Not difficult at all - - -      History Jonathan Rosales has a past medical history of Allergy, Anemia, Anxiety, Blood transfusion without reported diagnosis, Cataract, Chronic kidney disease, COPD (chronic obstructive pulmonary disease) ([place]), Coronary artery disease, Emphysema of lung ([place]), and Hypertension.   He has a past surgical history that includes Cardiac surgery; Coronary artery bypass graft; and Repair of perforated ulcer (2017).   His family history includes Alcohol abuse in his brother; COPD in his mother; Depression in his mother; Diabetes in his mother; Heart disease in his father; Kidney disease in his mother.He reports that he has been smoking cigarettes. He has been smoking an average of 1.00 packs per day. He has never used smokeless tobacco. He reports that he does not drink alcohol and does not use drugs.    ROS Review of Systems  Constitutional:  Negative for fever.  Respiratory:  Positive for shortness of breath (Can walk 1/2 mile including uphill).   Cardiovascular:  Negative for chest pain.  Musculoskeletal:  Negative for arthralgias.  Skin:  Negative for rash.   Objective:  BP (!) 178/108   Pulse 82   Temp 97.7 F (36.5 C)   Ht '5\' 8"'  (1.727 m)   Wt 118 lb 3.2 oz (53.6 kg)   SpO2 90%   BMI 17.97  kg/m   BP Readings from Last 3 Encounters:  10/30/20 (!) 178/108  09/18/20 100/72  09/04/20 (!) 142/89    Wt Readings from Last 3 Encounters:  10/30/20 118 lb 3.2 oz (53.6 kg)  09/17/20 127 lb (57.6 kg)  09/04/20 121 lb 9.6 oz (55.2 kg)     Physical Exam Constitutional:      General: He is not in acute distress.    Appearance: He is well-developed.  HENT:     Head: Normocephalic and atraumatic.     Right Ear: External ear normal.     Left Ear: External ear normal.     Nose: Nose normal.  Eyes:     Conjunctiva/sclera: Conjunctivae normal.     Pupils: Pupils are equal, round, and reactive to light.  Cardiovascular:     Rate and Rhythm: Normal rate and regular rhythm.     Heart  sounds: Normal heart sounds. No murmur heard. Pulmonary:     Effort: Pulmonary effort is normal. No respiratory distress.     Breath sounds: Normal breath sounds. No wheezing or rales.  Abdominal:     Palpations: Abdomen is soft.     Tenderness: There is no abdominal tenderness.  Musculoskeletal:        General: Normal range of motion.     Cervical back: Normal range of motion and neck supple.  Skin:    General: Skin is warm and dry.  Neurological:     Mental Status: He is alert and oriented to person, place, and time.     Deep Tendon Reflexes: Reflexes are normal and symmetric.  Psychiatric:        Behavior: Behavior normal.        Thought Content: Thought content normal.        Judgment: Judgment normal.      Assessment & Plan:   Jonathan Rosales was seen today for medical management of chronic issues.  Diagnoses and all orders for this visit:  Mixed hyperlipidemia -     CMP14+EGFR -     CBC with Differential/Platelet -     Lipid panel  Coronary artery disease involving native heart without angina pectoris, unspecified vessel or lesion type -     CMP14+EGFR -     CBC with Differential/Platelet -     atorvastatin (LIPITOR) 80 MG tablet; Take 1 tablet (80 mg total) by mouth at bedtime. -     carvedilol (COREG) 6.25 MG tablet; TAKE 1 TABLET 2 TIMES A DAY WITH A MEAL  Gastroesophageal reflux disease without esophagitis -     CMP14+EGFR -     CBC with Differential/Platelet -     pantoprazole (PROTONIX) 40 MG tablet; Take 1 tablet (40 mg total) by mouth 2 (two) times daily. -     sucralfate (CARAFATE) 1 g tablet; TAKE 1 TABLET FOUR TIMES DAILY BEFORE MEALS & AT BEDTIME  Essential hypertension -     CMP14+EGFR -     CBC with Differential/Platelet  Stage 3a chronic kidney disease (HCC) -     CMP14+EGFR -     CBC with Differential/Platelet  Screening for prostate cancer -     PSA, total and free  Other orders -     ferrous sulfate (FEROSUL) 325 (65 FE) MG tablet; Take 1 tablet  (325 mg total) by mouth daily with breakfast. -     escitalopram (LEXAPRO) 10 MG tablet; Take 1 tablet (10 mg total) by mouth daily.      I have discontinued  Youlanda Roys. Navarette's sertraline, doxycycline, and predniSONE. I am also having him start on escitalopram. Additionally, I am having him maintain his acetaminophen, vitamin B-12, fluticasone, loratadine, aspirin EC, lisinopril, hydroxyurea, ondansetron, Stiolto Respimat, clonazePAM, albuterol, atorvastatin, carvedilol, ferrous sulfate, pantoprazole, and sucralfate.  Allergies as of 10/30/2020       Reactions   Duloxetine Hcl Nausea And Vomiting   Other Other (See Comments)        Medication List        Accurate as of October 30, 2020  3:26 PM. If you have any questions, ask your nurse or doctor.          STOP taking these medications    doxycycline 100 MG capsule Commonly known as: VIBRAMYCIN Stopped by: Claretta Fraise, MD   predniSONE 20 MG tablet Commonly known as: DELTASONE Stopped by: Claretta Fraise, MD   sertraline 100 MG tablet Commonly known as: ZOLOFT Stopped by: Claretta Fraise, MD       TAKE these medications    acetaminophen 500 MG tablet Commonly known as: TYLENOL Take 500 mg by mouth daily as needed for headache (pain).   albuterol 108 (90 Base) MCG/ACT inhaler Commonly known as: VENTOLIN HFA INHALE 2 PUFFS EVERY 6 HOURS AS NEEDED FOR WHEEZING OR SHORTNESS OF BREATH   aspirin EC 81 MG tablet Take 81 mg by mouth at bedtime.   atorvastatin 80 MG tablet Commonly known as: LIPITOR Take 1 tablet (80 mg total) by mouth at bedtime.   carvedilol 6.25 MG tablet Commonly known as: COREG TAKE 1 TABLET 2 TIMES A DAY WITH A MEAL   clonazePAM 0.5 MG tablet Commonly known as: KLONOPIN Take 0.5 tablets (0.25 mg total) by mouth daily as needed for anxiety.   escitalopram 10 MG tablet Commonly known as: LEXAPRO Take 1 tablet (10 mg total) by mouth daily. Started by: Claretta Fraise, MD   ferrous  sulfate 325 (65 FE) MG tablet Commonly known as: FeroSul Take 1 tablet (325 mg total) by mouth daily with breakfast.   fluticasone 50 MCG/ACT nasal spray Commonly known as: FLONASE Place 2 sprays into both nostrils daily as needed (sinus headache).   hydroxyurea 500 MG capsule Commonly known as: HYDREA Take 1 capsule (500 mg total) by mouth daily. May take with food to minimize GI side effects.   lisinopril 20 MG tablet Commonly known as: ZESTRIL Take 1 tablet (20 mg total) by mouth daily.   loratadine 10 MG tablet Commonly known as: CLARITIN Take 10 mg by mouth daily as needed (seasonall allergies).   ondansetron 8 MG disintegrating tablet Commonly known as: ZOFRAN-ODT Take 1 tablet (8 mg total) by mouth every 6 (six) hours as needed for nausea or vomiting.   pantoprazole 40 MG tablet Commonly known as: PROTONIX Take 1 tablet (40 mg total) by mouth 2 (two) times daily.   Stiolto Respimat 2.5-2.5 MCG/ACT Aers Generic drug: Tiotropium Bromide-Olodaterol Inhale 2 puffs into the lungs daily.   sucralfate 1 g tablet Commonly known as: CARAFATE TAKE 1 TABLET FOUR TIMES DAILY BEFORE MEALS & AT BEDTIME   vitamin B-12 1000 MCG tablet Commonly known as: CYANOCOBALAMIN Take 1,000 mcg by mouth daily.         Follow-up: No follow-ups on file.  Claretta Fraise, M.D.

## 2020-10-31 LAB — CBC WITH DIFFERENTIAL/PLATELET
Basophils Absolute: 0.2 10*3/uL (ref 0.0–0.2)
Basos: 1 %
EOS (ABSOLUTE): 0.2 10*3/uL (ref 0.0–0.4)
Eos: 2 %
Hematocrit: 41.9 % (ref 37.5–51.0)
Hemoglobin: 14.9 g/dL (ref 13.0–17.7)
Immature Grans (Abs): 0 10*3/uL (ref 0.0–0.1)
Immature Granulocytes: 0 %
Lymphocytes Absolute: 1.5 10*3/uL (ref 0.7–3.1)
Lymphs: 13 %
MCH: 37.6 pg — ABNORMAL HIGH (ref 26.6–33.0)
MCHC: 35.6 g/dL (ref 31.5–35.7)
MCV: 106 fL — ABNORMAL HIGH (ref 79–97)
Monocytes Absolute: 0.8 10*3/uL (ref 0.1–0.9)
Monocytes: 7 %
Neutrophils Absolute: 8.9 10*3/uL — ABNORMAL HIGH (ref 1.4–7.0)
Neutrophils: 77 %
Platelets: 422 10*3/uL (ref 150–450)
RBC: 3.96 x10E6/uL — ABNORMAL LOW (ref 4.14–5.80)
RDW: 13.5 % (ref 11.6–15.4)
WBC: 11.6 10*3/uL — ABNORMAL HIGH (ref 3.4–10.8)

## 2020-10-31 LAB — LIPID PANEL
Chol/HDL Ratio: 2.4 ratio (ref 0.0–5.0)
Cholesterol, Total: 135 mg/dL (ref 100–199)
HDL: 56 mg/dL (ref >39)
LDL Chol Calc (NIH): 65 mg/dL (ref 0–99)
Triglycerides: 66 mg/dL (ref 0–149)
VLDL Cholesterol Cal: 14 mg/dL (ref 5–40)

## 2020-10-31 LAB — CMP14+EGFR
ALT: 14 IU/L (ref 0–44)
AST: 15 IU/L (ref 0–40)
Albumin/Globulin Ratio: 2 (ref 1.2–2.2)
Albumin: 3.9 g/dL (ref 3.7–4.7)
Alkaline Phosphatase: 80 IU/L (ref 44–121)
BUN/Creatinine Ratio: 15 (ref 10–24)
BUN: 16 mg/dL (ref 8–27)
Bilirubin Total: 0.4 mg/dL (ref 0.0–1.2)
CO2: 27 mmol/L (ref 20–29)
Calcium: 9.3 mg/dL (ref 8.6–10.2)
Chloride: 99 mmol/L (ref 96–106)
Creatinine, Ser: 1.1 mg/dL (ref 0.76–1.27)
Globulin, Total: 2 g/dL (ref 1.5–4.5)
Glucose: 103 mg/dL — ABNORMAL HIGH (ref 65–99)
Potassium: 4.2 mmol/L (ref 3.5–5.2)
Sodium: 142 mmol/L (ref 134–144)
Total Protein: 5.9 g/dL — ABNORMAL LOW (ref 6.0–8.5)
eGFR: 72 mL/min/{1.73_m2} (ref >59)

## 2020-10-31 LAB — PSA, TOTAL AND FREE
PSA, Free Pct: 50 %
PSA, Free: 0.05 ng/mL
Prostate Specific Ag, Serum: 0.1 ng/mL (ref 0.0–4.0)

## 2020-11-03 NOTE — Progress Notes (Signed)
Hello Jonathan Rosales,  Your lab result is normal and/or stable.Some minor variations that are not significant are commonly marked abnormal, but do not represent any medical problem for you.  Best regards, Pebbles Zeiders, M.D.

## 2020-11-13 DIAGNOSIS — M9902 Segmental and somatic dysfunction of thoracic region: Secondary | ICD-10-CM | POA: Diagnosis not present

## 2020-11-13 DIAGNOSIS — M9901 Segmental and somatic dysfunction of cervical region: Secondary | ICD-10-CM | POA: Diagnosis not present

## 2020-11-13 DIAGNOSIS — M9903 Segmental and somatic dysfunction of lumbar region: Secondary | ICD-10-CM | POA: Diagnosis not present

## 2020-11-13 DIAGNOSIS — M5137 Other intervertebral disc degeneration, lumbosacral region: Secondary | ICD-10-CM | POA: Diagnosis not present

## 2020-11-20 ENCOUNTER — Other Ambulatory Visit: Payer: Self-pay

## 2020-11-20 ENCOUNTER — Encounter: Payer: Self-pay | Admitting: *Deleted

## 2020-11-20 ENCOUNTER — Encounter: Payer: Self-pay | Admitting: Cardiology

## 2020-11-20 ENCOUNTER — Ambulatory Visit: Payer: Medicare Other | Admitting: Cardiology

## 2020-11-20 ENCOUNTER — Telehealth: Payer: Self-pay | Admitting: Cardiology

## 2020-11-20 VITALS — BP 150/90 | HR 78 | Ht 68.0 in | Wt 119.4 lb

## 2020-11-20 DIAGNOSIS — I25810 Atherosclerosis of coronary artery bypass graft(s) without angina pectoris: Secondary | ICD-10-CM

## 2020-11-20 DIAGNOSIS — I351 Nonrheumatic aortic (valve) insufficiency: Secondary | ICD-10-CM

## 2020-11-20 DIAGNOSIS — E782 Mixed hyperlipidemia: Secondary | ICD-10-CM

## 2020-11-20 DIAGNOSIS — M9901 Segmental and somatic dysfunction of cervical region: Secondary | ICD-10-CM | POA: Diagnosis not present

## 2020-11-20 DIAGNOSIS — M9902 Segmental and somatic dysfunction of thoracic region: Secondary | ICD-10-CM | POA: Diagnosis not present

## 2020-11-20 DIAGNOSIS — I10 Essential (primary) hypertension: Secondary | ICD-10-CM | POA: Diagnosis not present

## 2020-11-20 DIAGNOSIS — M5137 Other intervertebral disc degeneration, lumbosacral region: Secondary | ICD-10-CM | POA: Diagnosis not present

## 2020-11-20 DIAGNOSIS — M9903 Segmental and somatic dysfunction of lumbar region: Secondary | ICD-10-CM | POA: Diagnosis not present

## 2020-11-20 NOTE — Patient Instructions (Addendum)
Medication Instructions:  Continue all current medications.  Labwork: none  Testing/Procedures: Your physician has requested that you have an echocardiogram. Echocardiography is a painless test that uses sound waves to create images of your heart. It provides your doctor with information about the size and shape of your heart and how well your heart's chambers and valves are working. This procedure takes approximately one hour. There are no restrictions for this procedure. Your physician has requested that you have a lexiscan myoview. For further information please visit [email or web address]. Please follow instruction sheet, as given. Office will contact with results via phone or letter.    Follow-Up: Your physician wants you to follow up in:  1 year.  You will receive a reminder letter in the mail one-two months in advance.  If you don't receive a letter, please call our office to schedule the follow up appointment.    Any Other Special Instructions Will Be Listed Below (If Applicable).  If you need a refill on your cardiac medications before your next appointment, please call your pharmacy.

## 2020-11-20 NOTE — Progress Notes (Signed)
Clinical Summary [name] is a 70 y.o.male former patient of Dr Jonathan Rosales, this is our first visit together.   1.CAD - prior 3 vessel CABG in 10/2016 - Echocardiogram in June 2019 demonstrated mildly reduced LV systolic function, EF 45 to 40%, grade 1 diastolic dysfunction, and mild to moderate aortic regurgitation. - 01/2019 nuclear stress: inferior infarct mild ischemia, low to intermediate risk  - no recent chest pains. No SOB/DOE - compliant with meds  2. HTN - has had some white coat HTN - home bp's 120s/80s  3. 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 CKD  - Cr down from 1.5 a few years ago, to 1.1 10/2020  5. Aortic regurgitation - 2019 echo mild to moderate  6. COPD  7. Hyperlipidemia - 10/2020 TC 135 TG 66 HDL 56 LDL 65 - he is on atorvastatin    SH: works as Administrator.    Past Medical History:  Diagnosis Date   Allergy    Anemia    Anxiety    Blood transfusion without reported diagnosis    Cataract    Chronic kidney disease    COPD (chronic obstructive pulmonary disease) (HCC)    Coronary artery disease    Emphysema of lung (HCC)    Hypertension      Allergies  Allergen Reactions   Duloxetine Hcl Nausea And Vomiting   Other Other (See Comments)     Current Outpatient Medications  Medication Sig Dispense Refill   acetaminophen (TYLENOL) 500 MG tablet Take 500 mg by mouth daily as needed for headache (pain).      albuterol (VENTOLIN HFA) 108 (90 Base) MCG/ACT inhaler INHALE 2 PUFFS EVERY 6 HOURS AS NEEDED FOR WHEEZING OR SHORTNESS OF BREATH 8.5 g 2   aspirin EC 81 MG tablet Take 81 mg by mouth at bedtime.      atorvastatin (LIPITOR) 80 MG tablet Take 1 tablet (80 mg total) by mouth at bedtime. 90 tablet 3   carvedilol (COREG) 6.25 MG tablet TAKE 1 TABLET 2 TIMES A DAY WITH A MEAL 180 tablet 3   clonazePAM (KLONOPIN) 0.5 MG tablet Take 0.5 tablets (0.25 mg total) by mouth daily as  needed for anxiety. 15 tablet 5   escitalopram (LEXAPRO) 10 MG tablet Take 1 tablet (10 mg total) by mouth daily. 90 tablet 1   ferrous sulfate (FEROSUL) 325 (65 FE) MG tablet Take 1 tablet (325 mg total) by mouth daily with breakfast. 90 tablet 3   fluticasone (FLONASE) 50 MCG/ACT nasal spray Place 2 sprays into both nostrils daily as needed (sinus headache).      hydroxyurea (HYDREA) 500 MG capsule Take 1 capsule (500 mg total) by mouth daily. May take with food to minimize GI side effects. 90 capsule 3   lisinopril (ZESTRIL) 20 MG tablet Take 1 tablet (20 mg total) by mouth daily. 90 tablet 3   loratadine (CLARITIN) 10 MG tablet Take 10 mg by mouth daily as needed (seasonall allergies).      ondansetron (ZOFRAN-ODT) 8 MG disintegrating tablet Take 1 tablet (8 mg total) by mouth every 6 (six) hours as needed for nausea or vomiting. 20 tablet 1   pantoprazole (PROTONIX) 40 MG tablet Take 1 tablet (40 mg total) by mouth 2 (two) times daily. 180 tablet 3   STIOLTO RESPIMAT 2.5-2.5 MCG/ACT AERS Inhale 2 puffs into the lungs daily. 4 g 2   sucralfate (CARAFATE) 1 g tablet  TAKE 1 TABLET FOUR TIMES DAILY BEFORE MEALS & AT BEDTIME 360 tablet 3   vitamin B-12 (CYANOCOBALAMIN) 1000 MCG tablet Take 1,000 mcg by mouth daily.     No current facility-administered medications for this visit.     Past Surgical History:  Procedure Laterality Date   CARDIAC SURGERY     CORONARY ARTERY BYPASS GRAFT     REPAIR OF PERFORATED ULCER  2017     Allergies  Allergen Reactions   Duloxetine Hcl Nausea And Vomiting   Other Other (See Comments)      Family History  Problem Relation Age of Onset   COPD Mother    Depression Mother    Diabetes Mother    Kidney disease Mother    Heart disease Father    Alcohol abuse Brother      Social History [name] reports that he has been smoking cigarettes. He has been smoking an average of 1 pack per day. He has never used smokeless tobacco. [name]  reports no history of alcohol use.   Review of Systems CONSTITUTIONAL: No weight loss, fever, chills, weakness or fatigue.  HEENT: Eyes: No visual loss, blurred vision, double vision or yellow sclerae.No hearing loss, sneezing, congestion, runny nose or sore throat.  SKIN: No rash or itching.  CARDIOVASCULAR: per hpi RESPIRATORY: No shortness of breath, cough or sputum.  GASTROINTESTINAL: No anorexia, nausea, vomiting or diarrhea. No abdominal pain or blood.  GENITOURINARY: No burning on urination, no polyuria NEUROLOGICAL: No headache, dizziness, syncope, paralysis, ataxia, numbness or tingling in the extremities. No change in bowel or bladder control.  MUSCULOSKELETAL: No muscle, back pain, joint pain or stiffness.  LYMPHATICS: No enlarged nodes. No history of splenectomy.  PSYCHIATRIC: No history of depression or anxiety.  ENDOCRINOLOGIC: No reports of sweating, cold or heat intolerance. No polyuria or polydipsia.  Marland Kitchen   Physical Examination Today's Vitals   11/20/20 1148  BP: (!) 150/90  Pulse: 78  SpO2: 97%  Weight: 119 lb 6.4 oz (54.2 kg)  Height: 5\' 8"  (1.727 m)   Body mass index is 18.15 kg/m.  Gen: resting comfortably, no acute distress HEENT: no scleral icterus, pupils equal round and reactive, no palptable cervical adenopathy,  CV: RRR, no m/r/g, no jvd Resp: Clear to auscultation bilaterally GI: abdomen is soft, non-tender, non-distended, normal bowel sounds, no hepatosplenomegaly MSK: extremities are warm, no edema.  Skin: warm, no rash Neuro:  no focal deficits Psych: appropriate affect   Diagnostic Studies Nuclear stress test 01/18/2019:   Defect 1: There is a medium defect of moderate severity present in the mid inferoseptal, mid inferior, mid inferolateral and apical inferior location. Findings consistent with prior myocardial infarction with a mild degree of peri-infarct ischemia. This is a low to intermediate risk study. Nuclear stress EF: 56%. Diffuse  nonspecific ST segment depressions seen throughout study.      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 HTN - he reports history of white coat HTN, reported home numbers are at goal. No med changes  3. Hyperlipidemia - at goal, continue atorvastatin  4. Aortic regurigtation - mild to mod by 2019 echo, will order repeat echo for ongoing surveillance.   F/u 1 year      Arnoldo Lenis, M.D.

## 2020-11-20 NOTE — Telephone Encounter (Signed)
Checking percert on the following patient for testing scheduled at [facility].    LEXISCAN ECHO   12/18/2020

## 2020-12-11 ENCOUNTER — Ambulatory Visit: Payer: Medicare Other | Admitting: Cardiology

## 2020-12-11 DIAGNOSIS — M5137 Other intervertebral disc degeneration, lumbosacral region: Secondary | ICD-10-CM | POA: Diagnosis not present

## 2020-12-11 DIAGNOSIS — M9903 Segmental and somatic dysfunction of lumbar region: Secondary | ICD-10-CM | POA: Diagnosis not present

## 2020-12-11 DIAGNOSIS — M9901 Segmental and somatic dysfunction of cervical region: Secondary | ICD-10-CM | POA: Diagnosis not present

## 2020-12-11 DIAGNOSIS — M9902 Segmental and somatic dysfunction of thoracic region: Secondary | ICD-10-CM | POA: Diagnosis not present

## 2020-12-18 ENCOUNTER — Ambulatory Visit (HOSPITAL_COMMUNITY)
Admission: RE | Admit: 2020-12-18 | Discharge: 2020-12-18 | Disposition: A | Discharge status: Home / Self Care | Payer: Medicare Other | Source: Ambulatory Visit | Attending: Cardiology | Admitting: Cardiology

## 2020-12-18 ENCOUNTER — Encounter (HOSPITAL_COMMUNITY)
Admission: RE | Admit: 2020-12-18 | Discharge: 2020-12-18 | Disposition: A | Discharge status: Home / Self Care | Payer: Medicare Other | Source: Ambulatory Visit | Attending: Cardiology | Admitting: Cardiology

## 2020-12-18 ENCOUNTER — Ambulatory Visit: Payer: Medicare Other | Admitting: Hematology and Oncology

## 2020-12-18 ENCOUNTER — Other Ambulatory Visit: Payer: Self-pay

## 2020-12-18 DIAGNOSIS — I351 Nonrheumatic aortic (valve) insufficiency: Secondary | ICD-10-CM | POA: Insufficient documentation

## 2020-12-18 DIAGNOSIS — I25810 Atherosclerosis of coronary artery bypass graft(s) without angina pectoris: Secondary | ICD-10-CM | POA: Insufficient documentation

## 2020-12-18 LAB — ECHOCARDIOGRAM COMPLETE
Area-P 1/2: 3.39 cm2
P 1/2 time: 382 msec
S' Lateral: 3.6 cm

## 2020-12-18 LAB — NM MYOCAR MULTI W/SPECT W/WALL MOTION / EF
LV dias vol: 107 mL (ref 62–150)
LV sys vol: 59 mL
Peak HR: 100 {beats}/min
RATE: 0.34
Rest HR: 78 {beats}/min
SDS: 0
SRS: 0
SSS: 0
TID: 1.03

## 2020-12-18 MED ORDER — TECHNETIUM TC 99M TETROFOSMIN IV KIT
30.0000 | PACK | Freq: Once | INTRAVENOUS | Status: AC | PRN
  Administered 2020-12-18: 30 via INTRAVENOUS

## 2020-12-18 MED ORDER — TECHNETIUM TC 99M TETROFOSMIN IV KIT
10.0000 | PACK | Freq: Once | INTRAVENOUS | Status: AC | PRN
  Administered 2020-12-18: 11 via INTRAVENOUS

## 2020-12-18 MED ORDER — SODIUM CHLORIDE FLUSH 0.9 % IV SOLN
INTRAVENOUS | Status: AC
  Administered 2020-12-18: 10 mL via INTRAVENOUS
  Filled 2020-12-18: qty 10

## 2020-12-18 MED ORDER — REGADENOSON 0.4 MG/5ML IV SOLN
INTRAVENOUS | Status: AC
  Administered 2020-12-18: 0.4 mg via INTRAVENOUS
  Filled 2020-12-18: qty 5

## 2020-12-18 NOTE — Progress Notes (Signed)
*  PRELIMINARY RESULTS* Echocardiogram 2D Echocardiogram has been performed.  Samuel Germany 12/18/2020, 12:16 PM

## 2020-12-19 ENCOUNTER — Ambulatory Visit: Payer: Medicare Other | Admitting: Hematology and Oncology

## 2020-12-23 ENCOUNTER — Other Ambulatory Visit: Payer: Self-pay | Admitting: Family Medicine

## 2020-12-25 DIAGNOSIS — M9903 Segmental and somatic dysfunction of lumbar region: Secondary | ICD-10-CM | POA: Diagnosis not present

## 2020-12-25 DIAGNOSIS — M9901 Segmental and somatic dysfunction of cervical region: Secondary | ICD-10-CM | POA: Diagnosis not present

## 2020-12-25 DIAGNOSIS — M9902 Segmental and somatic dysfunction of thoracic region: Secondary | ICD-10-CM | POA: Diagnosis not present

## 2020-12-25 DIAGNOSIS — M5137 Other intervertebral disc degeneration, lumbosacral region: Secondary | ICD-10-CM | POA: Diagnosis not present

## 2020-12-30 NOTE — Progress Notes (Signed)
Patient Care Team: Claretta Fraise, MD as PCP - General (Family Medicine) Harl Bowie Alphonse Guild, MD as PCP - Cardiology (Cardiology)  DIAGNOSIS:    ICD-10-CM   1. 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 with above-mentioned history of essential thrombocytosis who is currently on hydroxyurea '500mg'$  daily. He presents to the clinic today for annual follow-up. No new problems or concerns  ALLERGIES:  is allergic to duloxetine hcl and other.  MEDICATIONS:  Current Outpatient Medications  Medication Sig Dispense Refill   acetaminophen (TYLENOL) 500 MG tablet Take 500 mg by mouth daily as needed for headache (pain).      albuterol (VENTOLIN HFA) 108 (90 Base) MCG/ACT inhaler INHALE 2 PUFFS EVERY 6 HOURS AS NEEDED FOR WHEEZING OR SHORTNESS OF BREATH 8.5 g 2   aspirin EC 81 MG tablet Take 81 mg by mouth at bedtime.      atorvastatin (LIPITOR) 80 MG tablet Take 1 tablet (80 mg total) by mouth at bedtime. 90 tablet 3   carvedilol (COREG) 6.25 MG tablet TAKE 1 TABLET 2 TIMES A DAY WITH A MEAL 180 tablet 3   clonazePAM (KLONOPIN) 0.5 MG tablet Take 0.5 tablets (0.25 mg total) by mouth daily as needed for anxiety. 15 tablet 5   escitalopram (LEXAPRO) 10 MG tablet Take 1 tablet (10 mg total) by mouth daily. 90 tablet 1   ferrous sulfate (FEROSUL) 325 (65 FE) MG tablet Take 1 tablet (325 mg total) by mouth daily with breakfast. 90 tablet 3   fluticasone (FLONASE) 50 MCG/ACT nasal spray Place 2 sprays into both nostrils daily as needed (sinus headache).      hydroxyurea (HYDREA) 500 MG capsule Take 1 capsule (500 mg total) by mouth daily. May take with food to minimize GI side effects. 90 capsule 3   lisinopril (ZESTRIL) 20 MG tablet Take 1 tablet (20 mg total) by mouth daily. 90 tablet 3   loratadine (CLARITIN) 10 MG tablet Take 10 mg by mouth daily as needed (seasonall allergies).       ondansetron (ZOFRAN-ODT) 8 MG disintegrating tablet Take 1 tablet (8 mg total) by mouth every 6 (six) hours as needed for nausea or vomiting. 20 tablet 1   pantoprazole (PROTONIX) 40 MG tablet Take 1 tablet (40 mg total) by mouth 2 (two) times daily. 180 tablet 3   STIOLTO RESPIMAT 2.5-2.5 MCG/ACT AERS Inhale 2 puffs into the lungs daily. 4 g 2   sucralfate (CARAFATE) 1 g tablet TAKE 1 TABLET FOUR TIMES DAILY BEFORE MEALS & AT BEDTIME 360 tablet 3   vitamin B-12 (CYANOCOBALAMIN) 1000 MCG tablet Take 1,000 mcg by mouth daily.     No current facility-administered medications for this visit.    PHYSICAL EXAMINATION: ECOG PERFORMANCE STATUS: 1 - Symptomatic but completely ambulatory  Vitals:   01/01/21 1043  BP: 125/84  Pulse: 85  Resp: 20  Temp: 98.1 F (36.7 C)  SpO2: (!) 89%   Filed Weights   01/01/21 1043  Weight: 116 lb 8 oz (52.8 kg)    LABORATORY DATA:  I have reviewed the data as listed CMP Latest Ref Rng & Units 10/30/2020 09/18/2020 10/25/2019  Glucose 65 - 99 mg/dL 103(H) 100(H) 82  BUN 8 - 27 mg/dL '16 15 19  '$ Creatinine 0.76 - 1.27 mg/dL 1.10 1.00 1.43(H)  Sodium 134 - 144 mmol/L 142 133(L) 143  Potassium 3.5 - 5.2 mmol/L  4.2 4.0 5.1  Chloride 96 - 106 mmol/L 99 100 101  CO2 20 - 29 mmol/L '27 26 25  '$ Calcium 8.6 - 10.2 mg/dL 9.3 8.7(L) 9.4  Total Protein 6.0 - 8.5 g/dL 5.9(L) 6.6 6.3  Total Bilirubin 0.0 - 1.2 mg/dL 0.4 0.9 0.3  Alkaline Phos 44 - 121 IU/L 80 66 84  AST 0 - 40 IU/L 15 13(L) 16  ALT 0 - 44 IU/L '14 16 11    '$ Lab Results  Component Value Date   WBC 11.6 (H) 10/30/2020   HGB 14.9 10/30/2020   HCT 41.9 10/30/2020   MCV 106 (H) 10/30/2020   PLT 422 10/30/2020   NEUTROABS 8.9 (H) 10/30/2020    ASSESSMENT & PLAN:  Essential thrombocytosis ([place]) Patient was followed at [facility] where he was diagnosed with essential thrombocytosis with a platelet count greater than 900,000 as part of a routine wellness exam 03/11/2016. Jak 2 mutation positive   Current  treatment: Hydroxyurea 500 mg daily started 04/01/2016.   Lab review:   10/30/20: WBC 11.6, platelets 422, hemoglobin 14.9 His platelet counts are stable   Recommendation will be to continue the Hydrea at the same dosage. I renewed hydroxyurea for another year. Return to clinic in 1 year for follow-up    No orders of the defined types were placed in this encounter.  The patient has a good understanding of the overall plan. he agrees with it. he will call with any problems that may develop before the next visit here.  Total time spent: 20 mins including face to face time and time spent for planning, charting and coordination of care  Rulon Eisenmenger, MD, MPH 01/01/2021  I, Thana Ates, am acting as scribe for Dr. Nicholas Lose.  I have reviewed the above documentation for accuracy and completeness, and I agree with the above.

## 2021-01-01 ENCOUNTER — Inpatient Hospital Stay: Payer: Medicare Other | Attending: Hematology and Oncology | Admitting: Hematology and Oncology

## 2021-01-01 ENCOUNTER — Other Ambulatory Visit: Payer: Self-pay

## 2021-01-01 DIAGNOSIS — Z7982 Long term (current) use of aspirin: Secondary | ICD-10-CM | POA: Diagnosis not present

## 2021-01-01 DIAGNOSIS — Z79899 Other long term (current) drug therapy: Secondary | ICD-10-CM | POA: Diagnosis not present

## 2021-01-01 DIAGNOSIS — D473 Essential (hemorrhagic) thrombocythemia: Secondary | ICD-10-CM | POA: Diagnosis not present

## 2021-01-01 MED ORDER — HYDROXYUREA 500 MG PO CAPS
500.0000 mg | ORAL_CAPSULE | Freq: Every day | ORAL | 3 refills | Status: DC
Start: 2021-01-01 — End: 2021-01-31

## 2021-01-01 NOTE — Assessment & Plan Note (Signed)
[facility] with a platelet count greater than 900,000 as part of a routine wellness exam 03/11/2016. Jak 2 mutation positive  Current treatment: Hydroxyurea 500 mg daily started 04/01/2016.  Lab review:  WBC 10.9, platelets 489, hemoglobin 15.3 His platelet counts have been trending upwards but overall they are in the reasonable range.  Recommendation will be to continue the Hydrea at the same dosage. I renewed hydroxyurea for another year. Return to clinic in 1 year for follow-up

## 2021-01-06 ENCOUNTER — Other Ambulatory Visit: Payer: Self-pay

## 2021-01-06 ENCOUNTER — Emergency Department (HOSPITAL_COMMUNITY): Payer: Medicare Other

## 2021-01-06 ENCOUNTER — Encounter (HOSPITAL_COMMUNITY): Payer: Self-pay | Admitting: *Deleted

## 2021-01-06 ENCOUNTER — Emergency Department (HOSPITAL_COMMUNITY)
Admission: EM | Admit: 2021-01-06 | Discharge: 2021-01-06 | Disposition: A | Discharge status: Home / Self Care | Payer: Medicare Other | Attending: Emergency Medicine | Admitting: Emergency Medicine

## 2021-01-06 DIAGNOSIS — I129 Hypertensive chronic kidney disease with stage 1 through stage 4 chronic kidney disease, or unspecified chronic kidney disease: Secondary | ICD-10-CM | POA: Diagnosis not present

## 2021-01-06 DIAGNOSIS — K5900 Constipation, unspecified: Secondary | ICD-10-CM | POA: Diagnosis not present

## 2021-01-06 DIAGNOSIS — I2510 Atherosclerotic heart disease of native coronary artery without angina pectoris: Secondary | ICD-10-CM | POA: Diagnosis not present

## 2021-01-06 DIAGNOSIS — Z7982 Long term (current) use of aspirin: Secondary | ICD-10-CM | POA: Diagnosis not present

## 2021-01-06 DIAGNOSIS — F17210 Nicotine dependence, cigarettes, uncomplicated: Secondary | ICD-10-CM | POA: Insufficient documentation

## 2021-01-06 DIAGNOSIS — I719 Aortic aneurysm of unspecified site, without rupture: Secondary | ICD-10-CM | POA: Diagnosis not present

## 2021-01-06 DIAGNOSIS — I723 Aneurysm of iliac artery: Secondary | ICD-10-CM | POA: Diagnosis not present

## 2021-01-06 DIAGNOSIS — E1122 Type 2 diabetes mellitus with diabetic chronic kidney disease: Secondary | ICD-10-CM | POA: Diagnosis not present

## 2021-01-06 DIAGNOSIS — N183 Chronic kidney disease, stage 3 unspecified: Secondary | ICD-10-CM | POA: Diagnosis not present

## 2021-01-06 DIAGNOSIS — Z79899 Other long term (current) drug therapy: Secondary | ICD-10-CM | POA: Diagnosis not present

## 2021-01-06 DIAGNOSIS — Z8616 Personal history of COVID-19: Secondary | ICD-10-CM | POA: Insufficient documentation

## 2021-01-06 DIAGNOSIS — Z951 Presence of aortocoronary bypass graft: Secondary | ICD-10-CM | POA: Insufficient documentation

## 2021-01-06 DIAGNOSIS — R911 Solitary pulmonary nodule: Secondary | ICD-10-CM

## 2021-01-06 DIAGNOSIS — R109 Unspecified abdominal pain: Secondary | ICD-10-CM | POA: Diagnosis not present

## 2021-01-06 DIAGNOSIS — I714 Abdominal aortic aneurysm, without rupture: Secondary | ICD-10-CM | POA: Diagnosis not present

## 2021-01-06 HISTORY — DX: COVID-19: U07.1

## 2021-01-06 LAB — COMPREHENSIVE METABOLIC PANEL
ALT: 17 U/L (ref 0–44)
AST: 14 U/L — ABNORMAL LOW (ref 15–41)
Albumin: 3.9 g/dL (ref 3.5–5.0)
Alkaline Phosphatase: 71 U/L (ref 38–126)
Anion gap: 6 (ref 5–15)
BUN: 19 mg/dL (ref 8–23)
CO2: 31 mmol/L (ref 22–32)
Calcium: 8.9 mg/dL (ref 8.9–10.3)
Chloride: 99 mmol/L (ref 98–111)
Creatinine, Ser: 1.16 mg/dL (ref 0.61–1.24)
GFR, Estimated: >60 mL/min (ref >60)
Glucose, Bld: 93 mg/dL (ref 70–99)
Potassium: 4.5 mmol/L (ref 3.5–5.1)
Sodium: 136 mmol/L (ref 135–145)
Total Bilirubin: 0.6 mg/dL (ref 0.3–1.2)
Total Protein: 6.7 g/dL (ref 6.5–8.1)

## 2021-01-06 LAB — URINALYSIS, ROUTINE W REFLEX MICROSCOPIC
Bilirubin Urine: NEGATIVE
Glucose, UA: NEGATIVE mg/dL
Hgb urine dipstick: NEGATIVE
Ketones, ur: NEGATIVE mg/dL
Leukocytes,Ua: NEGATIVE
Nitrite: NEGATIVE
Protein, ur: NEGATIVE mg/dL
Specific Gravity, Urine: 1.01 (ref 1.005–1.030)
pH: 6 (ref 5.0–8.0)

## 2021-01-06 LAB — CBC WITH DIFFERENTIAL/PLATELET
Abs Immature Granulocytes: 0.05 10*3/uL (ref 0.00–0.07)
Basophils Absolute: 0.1 10*3/uL (ref 0.0–0.1)
Basophils Relative: 1 %
Eosinophils Absolute: 0.2 10*3/uL (ref 0.0–0.5)
Eosinophils Relative: 2 %
HCT: 46.7 % (ref 39.0–52.0)
Hemoglobin: 15.9 g/dL (ref 13.0–17.0)
Immature Granulocytes: 1 %
Lymphocytes Relative: 14 %
Lymphs Abs: 1.5 10*3/uL (ref 0.7–4.0)
MCH: 38.4 pg — ABNORMAL HIGH (ref 26.0–34.0)
MCHC: 34 g/dL (ref 30.0–36.0)
MCV: 112.8 fL — ABNORMAL HIGH (ref 80.0–100.0)
Monocytes Absolute: 0.8 10*3/uL (ref 0.1–1.0)
Monocytes Relative: 7 %
Neutro Abs: 8.1 10*3/uL — ABNORMAL HIGH (ref 1.7–7.7)
Neutrophils Relative %: 75 %
Platelets: 410 10*3/uL — ABNORMAL HIGH (ref 150–400)
RBC: 4.14 MIL/uL — ABNORMAL LOW (ref 4.22–5.81)
RDW: 14.1 % (ref 11.5–15.5)
WBC: 10.8 10*3/uL — ABNORMAL HIGH (ref 4.0–10.5)
nRBC: 0 % (ref 0.0–0.2)

## 2021-01-06 LAB — LIPASE, BLOOD: Lipase: 33 U/L (ref 11–51)

## 2021-01-06 MED ORDER — IOHEXOL 350 MG/ML SOLN
80.0000 mL | Freq: Once | INTRAVENOUS | Status: AC | PRN
  Administered 2021-01-06: 80 mL via INTRAVENOUS

## 2021-01-06 MED ORDER — SODIUM CHLORIDE 0.9 % IV BOLUS
500.0000 mL | Freq: Once | INTRAVENOUS | Status: AC
  Administered 2021-01-06: 500 mL via INTRAVENOUS

## 2021-01-06 MED ORDER — POLYETHYLENE GLYCOL 3350 17 G PO PACK: 17.0000 g | PACK | Freq: Every day | ORAL | 0 refills | Status: AC

## 2021-01-06 NOTE — ED Provider Notes (Signed)
   Patient signed out to me by Coral Ceo, PA-C pending completion of work-up.  Patient here for evaluation of constipation and lower abdominal discomfort x3 days.  He was found to have increased size of abdominal aortic aneurysm.  Vascular surgery was consulted and patient made aware of results.  Urinalysis and bladder scan pending at time of transition of care.  Letter scan has been performed.  Post void bladder scan shows 412 cc urine.  Urinalysis unremarkable.  When patient asked if he felt he is unable to fully empty his bladder he stated "no, I just thought that she needed a small amount of urine."  We will have patient void again and re scan his bladder.  Patient has voided again, repeat postvoid residual now shows no remaining urine in bladder.   Discussed findings with patient.  He appears appropriate for discharge home and is agreeable to follow-up with his vascular surgeon  Labs Reviewed  CBC WITH DIFFERENTIAL/PLATELET - Abnormal; Notable for the following components:      Result Value   WBC 10.8 (*)    RBC 4.14 (*)    MCV 112.8 (*)    MCH 38.4 (*)    Platelets 410 (*)    Neutro Abs 8.1 (*)    All other components within normal limits  COMPREHENSIVE METABOLIC PANEL - Abnormal; Notable for the following components:   AST 14 (*)    All other components within normal limits  LIPASE, BLOOD  URINALYSIS, ROUTINE W REFLEX MICROSCOPIC      Kem Parkinson, PA-C 01/06/21 2041    Sherwood Gambler, MD 01/08/21 2329

## 2021-01-06 NOTE — ED Triage Notes (Signed)
Pt c/o constipation x 3 days.  Has tried stool softeners, prune juice and enema today with little results.  Mild abd pain .

## 2021-01-06 NOTE — Discharge Instructions (Addendum)
Your CT scan showed that you have some enlargement of your aorta and some of the arteries in your abdomen.  You will need to follow-up with Dr. Mee Hives office with vascular surgery for further evaluation of this.  You were noted to have a lung nodule on your CT scan.  This is an incidental finding and is not a definitive diagnosis however you will need to follow-up with your primary care provider to have periodic imaging of the nodule to ensure there is no enlargement of the nodule over time.  Your urinalysis did not show evidence of infection.  I have given you prescription for MiraLAX to help with the constipation.  Please take as directed.  Please follow up with your primary care provider within 5-7 days for re-evaluation of your symptoms. If you do not have a primary care provider, information for a healthcare clinic has been provided for you to make arrangements for follow up care. Please return to the emergency department for any new or worsening symptoms.

## 2021-01-06 NOTE — ED Provider Notes (Signed)
[facility] EMERGENCY DEPARTMENT Provider Note   CSN: [id number] Arrival date & time: 01/06/21  1414     History Chief Complaint  Patient presents with   Constipation    Jonathan Rosales is a 70 y.o. male.  HPI  70 year old male with a history of allergies, anemia, anxiety, cataracts, CKD, COPD, CAD, COVID, emphysema, hypertension, who presents to the emergency department today for evaluation of constipation.  States that he has had constipation for the last 3 days.  He is taking stool softener sugars and his wife gave him an enema without significant relief.  He only had a small bowel movement today.  His last bowel movement that was normal was probably about 3 days ago.  He reports some lower abdominal tightness.  He denies any nausea or vomiting.  Denies dysuria, frequency or hematuria.  Denies any fevers.  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   CKD (chronic kidney disease), stage III ([place]) 11/17/2017   Coronary artery disease involving native heart without angina pectoris 11/17/2017   Essential hypertension 11/17/2017   Chronic obstructive pulmonary disease ([place]) 11/17/2017   History of ulcer disease 11/17/2017   Gastroesophageal reflux disease without esophagitis 11/17/2017    Past Surgical History:  Procedure Laterality Date   CARDIAC SURGERY     CORONARY ARTERY BYPASS GRAFT     REPAIR OF PERFORATED ULCER  2017       Family History  Problem Relation Age of Onset   COPD Mother    Depression Mother    Diabetes Mother    Kidney disease  Mother    Heart disease Father    Alcohol abuse Brother     Social History   Tobacco Use   Smoking status: Every Day    Packs/day: 1.00    Types: Cigarettes   Smokeless tobacco: Never  Vaping Use   Vaping Use: Never used  Substance Use Topics   Alcohol use: No   Drug use: No    Home Medications Prior to Admission medications   Medication Sig Start Date End Date Taking? Authorizing Provider  acetaminophen (TYLENOL) 500 MG tablet Take 500 mg by mouth daily as needed for headache (pain).    Yes [provider]  albuterol (VENTOLIN HFA) 108 (90 Base) MCG/ACT inhaler INHALE 2 PUFFS EVERY 6 HOURS AS NEEDED FOR WHEEZING OR SHORTNESS OF BREATH Patient taking differently: Inhale 2 puffs into the lungs every 6 (six) hours as needed for shortness of breath. 10/09/20  Yes Claretta Fraise, MD  aspirin EC 81 MG tablet Take 81 mg by mouth at bedtime.    Yes [provider]  atorvastatin (LIPITOR) 80 MG tablet Take 1 tablet (80 mg total) by mouth at bedtime. 10/30/20  Yes Stacks, Cletus Gash, MD  carvedilol (COREG) 6.25 MG tablet TAKE 1 TABLET 2 TIMES A DAY WITH A MEAL 10/30/20  Yes Stacks, Cletus Gash, MD  clonazePAM (KLONOPIN) 0.5 MG tablet Take 0.5 tablets (0.25 mg total) by mouth daily as needed  for anxiety. 09/04/20  Yes Claretta Fraise, MD  ferrous sulfate (FEROSUL) 325 (65 FE) MG tablet Take 1 tablet (325 mg total) by mouth daily with breakfast. 10/30/20  Yes Claretta Fraise, MD  fluticasone (FLONASE) 50 MCG/ACT nasal spray Place 2 sprays into both nostrils daily as needed (sinus headache).    Yes [provider]  hydroxyurea (HYDREA) 500 MG capsule Take 1 capsule (500 mg total) by mouth daily. May take with food to minimize GI side effects. 01/01/21  Yes Nicholas Lose, MD  lisinopril (ZESTRIL) 20 MG tablet Take 1 tablet (20 mg total) by mouth daily. Patient taking differently: Take 10 mg by mouth daily. 10/25/19  Yes Claretta Fraise, MD  loratadine (CLARITIN) 10 MG tablet Take 10 mg by  mouth daily as needed (seasonall allergies).    Yes [provider]  pantoprazole (PROTONIX) 40 MG tablet Take 1 tablet (40 mg total) by mouth 2 (two) times daily. 10/30/20  Yes Stacks, Cletus Gash, MD  polyethylene glycol (MIRALAX) 17 g packet Take 17 g by mouth daily. Dissolve one cap full in solution (water, gatorade, etc.) and administer once cap-full daily. You may titrate up daily by 1 cap-full until the patient is having pudding consistency of stools. After the patient is able to start passing softer stools they will need to be on 1/2 cap-full daily for 2 weeks. 01/06/21  Yes Maximillion Gill S, PA-C  STIOLTO RESPIMAT 2.5-2.5 MCG/ACT AERS Inhale 2 puffs into the lungs daily. 12/25/20  Yes Stacks, Cletus Gash, MD  sucralfate (CARAFATE) 1 g tablet TAKE 1 TABLET FOUR TIMES DAILY BEFORE MEALS & AT BEDTIME 10/30/20  Yes Stacks, Cletus Gash, MD  escitalopram (LEXAPRO) 10 MG tablet Take 1 tablet (10 mg total) by mouth daily. Patient not taking: No sig reported 10/30/20   Claretta Fraise, MD  ondansetron (ZOFRAN-ODT) 8 MG disintegrating tablet Take 1 tablet (8 mg total) by mouth every 6 (six) hours as needed for nausea or vomiting. Patient not taking: No sig reported 05/15/20   Claretta Fraise, MD  vitamin B-12 (CYANOCOBALAMIN) 1000 MCG tablet Take 1,000 mcg by mouth daily. Patient not taking: Reported on 01/06/2021    [provider]    Allergies    Duloxetine hcl and Other  Review of Systems   Review of Systems  Constitutional:  Negative for chills and fever.  HENT:  Negative for ear pain and sore throat.   Eyes:  Negative for visual disturbance.  Respiratory:  Negative for cough and shortness of breath.   Cardiovascular:  Negative for chest pain.  Gastrointestinal:  Positive for abdominal pain and constipation. Negative for diarrhea and vomiting.  Genitourinary:  Negative for dysuria and hematuria.  Musculoskeletal:  Negative for back pain.  Skin:  Negative for color change and rash.   Neurological:  Negative for syncope and weakness.  All other systems reviewed and are negative.  Physical Exam Updated Vital Signs BP 127/83   Pulse 89   Temp (!) 97.5 F (36.4 C) (Oral)   Resp 16   Ht '5\' 8"'$  (1.727 m)   Wt 52.6 kg   SpO2 93%   BMI 17.64 kg/m   Physical Exam Vitals and nursing note reviewed.  Constitutional:      Appearance: He is well-developed.  HENT:     Head: Normocephalic and atraumatic.  Eyes:     Conjunctiva/sclera: Conjunctivae normal.  Cardiovascular:     Rate and Rhythm: Normal rate and regular rhythm.     Heart sounds: Normal heart sounds. No murmur  heard. Pulmonary:     Effort: Pulmonary effort is normal. No respiratory distress.     Breath sounds: Normal breath sounds. No wheezing, rhonchi or rales.  Abdominal:     General: Bowel sounds are normal.     Palpations: Abdomen is soft.     Tenderness: There is no abdominal tenderness. There is no rebound.  Musculoskeletal:     Cervical back: Neck supple.  Skin:    General: Skin is warm and dry.  Neurological:     Mental Status: He is alert.    ED Results / Procedures / Treatments   Labs (all labs ordered are listed, but only abnormal results are displayed) Labs Reviewed  CBC WITH DIFFERENTIAL/PLATELET - Abnormal; Notable for the following components:      Result Value   WBC 10.8 (*)    RBC 4.14 (*)    MCV 112.8 (*)    MCH 38.4 (*)    Platelets 410 (*)    Neutro Abs 8.1 (*)    All other components within normal limits  COMPREHENSIVE METABOLIC PANEL - Abnormal; Notable for the following components:   AST 14 (*)    All other components within normal limits  LIPASE, BLOOD  URINALYSIS, ROUTINE W REFLEX MICROSCOPIC    EKG None  Radiology CT ABDOMEN PELVIS W CONTRAST  Result Date: 01/06/2021 CLINICAL DATA:  Acute nonlocalized abdominal pain, constipation for 3 days, history hypertension, COPD, smoker EXAM: CT ABDOMEN AND PELVIS WITH CONTRAST TECHNIQUE: Multidetector CT imaging of  the abdomen and pelvis was performed using the standard protocol following bolus administration of intravenous contrast. Sagittal and coronal MPR images reconstructed from axial data set. CONTRAST:  48m OMNIPAQUE IOHEXOL 350 MG/ML SOLN IV. No oral contrast. COMPARISON:  01/26/2018 FINDINGS: Lower chest: Lung bases emphysematous. Calcified granuloma RIGHT lower lobe image 13. Question 3 mm RIGHT lower lobe nodule image 4. Hepatobiliary: Small hepatic cysts. Gallbladder and liver otherwise normal appearance Pancreas: Normal appearance Spleen: Normal appearance. Adrenals/Urinary Tract: LEFT adrenal gland normal appearance. RIGHT adrenal mass 4.0 x 2.3 cm demonstrating significant washout on delayed images consistent with adrenal adenoma. Cyst at mid LEFT kidney 3.6 x 3.4 cm. Cortical scarring at inferior pole RIGHT kidney unchanged. No hydronephrosis or ureteral dilatation. Bladder unremarkable. Stomach/Bowel: Stomach decompressed, wall thickness suboptimally assessed. Stool throughout colon. Appendix not visualized. No definite bowel wall thickening or obstruction. Vascular/Lymphatic: Extensive atherosclerotic calcifications aorta, iliac arteries, visceral arteries. Fusiform aneurysmal dilatation of mid to distal abdominal aorta 4.5 x 4.1 cm image 34 (previously 3.5 x 3.2 cm), aneurysmal segment extending 6.7 cm length into bifurcation. No perianeurysmal hemorrhage. 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 Narrowing of proximal RIGHT common iliac artery and probably of external iliac arteries bilaterally. No adenopathy Reproductive: Mild prostatic enlargement Other: No free air or free fluid.  No hernia. Musculoskeletal: Osseous demineralization. Degenerative disc disease changes lumbar spine. IMPRESSION: Significant increased size of fusiform mid to distal abdominal aortic aneurysm 4.5 x 4.1 cm (previously 3.5 x 3.2 cm) since 2019 without evidence of rupture/leak. Recommend follow-up CT/MR  every 6 months and vascular consultation. This recommendation follows ACR consensus guidelines: White Paper of the ACR Incidental Findings Committee II on Vascular Findings. J Am Coll Radiol 2013; 10:789-794. 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 Narrowing of proximal RIGHT common iliac artery and probably of external iliac arteries bilaterally. RIGHT adrenal adenoma 4.0 x 2.3 cm. Mild prostatic enlargement. No acute intra-abdominal or intrapelvic abnormalities. Question 3 mm RIGHT  lower lobe nodule; no follow-up needed if patient is low-risk. Non-contrast chest CT can be considered in 12 months if patient is high-risk. This recommendation follows the consensus statement: Guidelines for Management of Incidental Pulmonary Nodules Detected on CT Images: From the Fleischner Society 2017; Radiology 2017; 284:228-243. Aortic Atherosclerosis (ICD10-I70.0) and Emphysema (ICD10-J43.9). Electronically Signed   By: Lavonia Dana M.D.   On: 01/06/2021 18:36    Procedures Procedures   Medications Ordered in ED Medications  sodium chloride 0.9 % bolus 500 mL (500 mLs Intravenous Bolus 01/06/21 1733)  iohexol (OMNIPAQUE) 350 MG/ML injection 80 mL (80 mLs Intravenous Contrast Given 01/06/21 1753)    ED Course  I have reviewed the triage vital signs and the nursing notes.  Pertinent labs & imaging results that were available during my care of the patient were reviewed by me and considered in my medical decision making (see chart for details).    MDM Rules/Calculators/A&P                          70 year old male presenting to the emergency department today for evaluation of constipation and lower abdominal tightness for the last 3 days  Reviewed/interpreted labs CBC with mild leukocytosis, no anemia CMP is unremarkable Lipase negative UA pending at shift change  Reviewed/interpreted imaging CT abd/pelvis - Significant increased size of fusiform mid to distal abdominal aortic  aneurysm 4.5 x 4.1 cm (previously 3.5 x 3.2 cm) since 2019 without evidence of rupture/leak. Recommend follow-up CT/MR every 6 months and vascular consultation. This recommendation follows ACR consensus guidelines: White Paper of the ACR Incidental Findings Committee II on Vascular Findings. J Am Coll Radiol 2013; 10:789-794. 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 Narrowing of proximal RIGHT common iliac artery and probably of external iliac arteries bilaterally. RIGHT adrenal adenoma 4.0 x 2.3 cm. Mild prostatic enlargement. No acute intra-abdominal or intrapelvic abnormalities. Question 3 mm RIGHT lower lobe nodule; no follow-up needed if patient is low-risk. Non-contrast chest CT can be considered in 12 months if patient is high-risk. This recommendation follows the consensus statement: Guidelines for Management of Incidental Pulmonary Nodules Detected on CT Images: From the Fleischner Society 2017; Radiology 2017; 284:228-243. Aortic Atherosclerosis (ICD10-I70.0) and Emphysema   6:47 PM CONSULT with Dr. Scot Dock with vascular surgery who recommends outpatient follow up.  Pt made aware of incidental findings in CT. He will be given information to follow up with Vascular and pcp.   At shift change, care transitioned to [facility], PA-C pending UA and post void residual bladder scan. If negative, plan for d/c home with miralax.    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

## 2021-01-06 NOTE — ED Notes (Signed)
Wheeled out to car with wife. Verbalized understanding of d/c papers

## 2021-01-11 ENCOUNTER — Other Ambulatory Visit: Payer: Self-pay | Admitting: Family Medicine

## 2021-01-11 DIAGNOSIS — N1831 Chronic kidney disease, stage 3a: Secondary | ICD-10-CM

## 2021-01-18 ENCOUNTER — Emergency Department (HOSPITAL_COMMUNITY): Payer: Medicare Other

## 2021-01-18 ENCOUNTER — Encounter (HOSPITAL_COMMUNITY): Payer: Self-pay

## 2021-01-18 ENCOUNTER — Inpatient Hospital Stay (HOSPITAL_COMMUNITY)
Admission: EM | Admit: 2021-01-18 | Discharge: 2021-01-31 | DRG: 190 | Disposition: A | Discharge status: Hospice | Payer: Medicare Other | Attending: Internal Medicine | Admitting: Internal Medicine

## 2021-01-18 ENCOUNTER — Other Ambulatory Visit: Payer: Self-pay

## 2021-01-18 DIAGNOSIS — Z66 Do not resuscitate: Secondary | ICD-10-CM | POA: Diagnosis not present

## 2021-01-18 DIAGNOSIS — R54 Age-related physical debility: Secondary | ICD-10-CM | POA: Diagnosis present

## 2021-01-18 DIAGNOSIS — J9621 Acute and chronic respiratory failure with hypoxia: Secondary | ICD-10-CM | POA: Diagnosis not present

## 2021-01-18 DIAGNOSIS — J9601 Acute respiratory failure with hypoxia: Secondary | ICD-10-CM | POA: Diagnosis not present

## 2021-01-18 DIAGNOSIS — I129 Hypertensive chronic kidney disease with stage 1 through stage 4 chronic kidney disease, or unspecified chronic kidney disease: Secondary | ICD-10-CM | POA: Diagnosis not present

## 2021-01-18 DIAGNOSIS — F17210 Nicotine dependence, cigarettes, uncomplicated: Secondary | ICD-10-CM | POA: Diagnosis not present

## 2021-01-18 DIAGNOSIS — Z20822 Contact with and (suspected) exposure to covid-19: Secondary | ICD-10-CM | POA: Diagnosis present

## 2021-01-18 DIAGNOSIS — Z825 Family history of asthma and other chronic lower respiratory diseases: Secondary | ICD-10-CM

## 2021-01-18 DIAGNOSIS — J439 Emphysema, unspecified: Secondary | ICD-10-CM | POA: Diagnosis not present

## 2021-01-18 DIAGNOSIS — N182 Chronic kidney disease, stage 2 (mild): Secondary | ICD-10-CM | POA: Diagnosis not present

## 2021-01-18 DIAGNOSIS — R0902 Hypoxemia: Secondary | ICD-10-CM | POA: Diagnosis not present

## 2021-01-18 DIAGNOSIS — K219 Gastro-esophageal reflux disease without esophagitis: Secondary | ICD-10-CM | POA: Diagnosis not present

## 2021-01-18 DIAGNOSIS — Z8711 Personal history of peptic ulcer disease: Secondary | ICD-10-CM

## 2021-01-18 DIAGNOSIS — Z515 Encounter for palliative care: Secondary | ICD-10-CM | POA: Diagnosis not present

## 2021-01-18 DIAGNOSIS — J90 Pleural effusion, not elsewhere classified: Secondary | ICD-10-CM | POA: Diagnosis not present

## 2021-01-18 DIAGNOSIS — Z8701 Personal history of pneumonia (recurrent): Secondary | ICD-10-CM

## 2021-01-18 DIAGNOSIS — Z79899 Other long term (current) drug therapy: Secondary | ICD-10-CM | POA: Diagnosis not present

## 2021-01-18 DIAGNOSIS — Z8614 Personal history of Methicillin resistant Staphylococcus aureus infection: Secondary | ICD-10-CM | POA: Diagnosis not present

## 2021-01-18 DIAGNOSIS — R000 Tachycardia, unspecified: Secondary | ICD-10-CM | POA: Diagnosis not present

## 2021-01-18 DIAGNOSIS — J471 Bronchiectasis with (acute) exacerbation: Secondary | ICD-10-CM | POA: Diagnosis not present

## 2021-01-18 DIAGNOSIS — Z7189 Other specified counseling: Secondary | ICD-10-CM | POA: Diagnosis not present

## 2021-01-18 DIAGNOSIS — Z841 Family history of disorders of kidney and ureter: Secondary | ICD-10-CM

## 2021-01-18 DIAGNOSIS — Z888 Allergy status to other drugs, medicaments and biological substances status: Secondary | ICD-10-CM

## 2021-01-18 DIAGNOSIS — J441 Chronic obstructive pulmonary disease with (acute) exacerbation: Secondary | ICD-10-CM | POA: Diagnosis present

## 2021-01-18 DIAGNOSIS — K2100 Gastro-esophageal reflux disease with esophagitis, without bleeding: Secondary | ICD-10-CM | POA: Diagnosis present

## 2021-01-18 DIAGNOSIS — J9602 Acute respiratory failure with hypercapnia: Secondary | ICD-10-CM | POA: Diagnosis not present

## 2021-01-18 DIAGNOSIS — Z7982 Long term (current) use of aspirin: Secondary | ICD-10-CM

## 2021-01-18 DIAGNOSIS — F17200 Nicotine dependence, unspecified, uncomplicated: Secondary | ICD-10-CM | POA: Diagnosis not present

## 2021-01-18 DIAGNOSIS — Z951 Presence of aortocoronary bypass graft: Secondary | ICD-10-CM

## 2021-01-18 DIAGNOSIS — I10 Essential (primary) hypertension: Secondary | ICD-10-CM | POA: Diagnosis not present

## 2021-01-18 DIAGNOSIS — R4702 Dysphasia: Secondary | ICD-10-CM | POA: Diagnosis not present

## 2021-01-18 DIAGNOSIS — I2510 Atherosclerotic heart disease of native coronary artery without angina pectoris: Secondary | ICD-10-CM | POA: Diagnosis present

## 2021-01-18 DIAGNOSIS — F419 Anxiety disorder, unspecified: Secondary | ICD-10-CM | POA: Diagnosis present

## 2021-01-18 DIAGNOSIS — J449 Chronic obstructive pulmonary disease, unspecified: Secondary | ICD-10-CM | POA: Diagnosis not present

## 2021-01-18 DIAGNOSIS — Z8249 Family history of ischemic heart disease and other diseases of the circulatory system: Secondary | ICD-10-CM

## 2021-01-18 DIAGNOSIS — Z8616 Personal history of COVID-19: Secondary | ICD-10-CM | POA: Diagnosis not present

## 2021-01-18 DIAGNOSIS — R0689 Other abnormalities of breathing: Secondary | ICD-10-CM | POA: Diagnosis not present

## 2021-01-18 DIAGNOSIS — Z818 Family history of other mental and behavioral disorders: Secondary | ICD-10-CM

## 2021-01-18 DIAGNOSIS — L89150 Pressure ulcer of sacral region, unstageable: Secondary | ICD-10-CM | POA: Diagnosis not present

## 2021-01-18 DIAGNOSIS — G9341 Metabolic encephalopathy: Secondary | ICD-10-CM | POA: Diagnosis present

## 2021-01-18 DIAGNOSIS — J9622 Acute and chronic respiratory failure with hypercapnia: Secondary | ICD-10-CM | POA: Diagnosis present

## 2021-01-18 DIAGNOSIS — R0602 Shortness of breath: Secondary | ICD-10-CM | POA: Diagnosis not present

## 2021-01-18 DIAGNOSIS — R069 Unspecified abnormalities of breathing: Secondary | ICD-10-CM | POA: Diagnosis not present

## 2021-01-18 LAB — BLOOD GAS, ARTERIAL
Acid-Base Excess: 6.9 mmol/L — ABNORMAL HIGH (ref 0.0–2.0)
Bicarbonate: 28 mmol/L (ref 20.0–28.0)
FIO2: 30
O2 Saturation: 93.6 %
Patient temperature: 37
pCO2 arterial: 70.7 mmHg (ref 32.0–48.0)
pH, Arterial: 7.293 — ABNORMAL LOW (ref 7.350–7.450)
pO2, Arterial: 83.4 mmHg (ref 83.0–108.0)

## 2021-01-18 LAB — BLOOD GAS, VENOUS
Acid-Base Excess: 4.9 mmol/L — ABNORMAL HIGH (ref 0.0–2.0)
Acid-Base Excess: 8.1 mmol/L — ABNORMAL HIGH (ref 0.0–2.0)
Bicarbonate: 25.6 mmol/L (ref 20.0–28.0)
Bicarbonate: 26.6 mmol/L (ref 20.0–28.0)
FIO2: 36
FIO2: 35
O2 Saturation: 94.9 %
O2 Saturation: 53.3 %
Patient temperature: 36.9
Patient temperature: 36.9
pCO2, Ven: 78.2 mmHg (ref 44.0–60.0)
pCO2, Ven: 90.6 mmHg (ref 44.0–60.0)
pH, Ven: 7.235 — ABNORMAL LOW (ref 7.250–7.430)
pH, Ven: 7.218 — ABNORMAL LOW (ref 7.250–7.430)
pO2, Ven: 96.6 mmHg — ABNORMAL HIGH (ref 32.0–45.0)
pO2, Ven: 35.4 mmHg (ref 32.0–45.0)

## 2021-01-18 LAB — CBC WITH DIFFERENTIAL/PLATELET
Abs Immature Granulocytes: 0.08 10*3/uL — ABNORMAL HIGH (ref 0.00–0.07)
Basophils Absolute: 0.1 10*3/uL (ref 0.0–0.1)
Basophils Relative: 1 %
Eosinophils Absolute: 0 10*3/uL (ref 0.0–0.5)
Eosinophils Relative: 0 %
HCT: 49.5 % (ref 39.0–52.0)
Hemoglobin: 16.2 g/dL (ref 13.0–17.0)
Immature Granulocytes: 1 %
Lymphocytes Relative: 5 %
Lymphs Abs: 0.6 10*3/uL — ABNORMAL LOW (ref 0.7–4.0)
MCH: 37.8 pg — ABNORMAL HIGH (ref 26.0–34.0)
MCHC: 32.7 g/dL (ref 30.0–36.0)
MCV: 115.4 fL — ABNORMAL HIGH (ref 80.0–100.0)
Monocytes Absolute: 0.8 10*3/uL (ref 0.1–1.0)
Monocytes Relative: 6 %
Neutro Abs: 10.6 10*3/uL — ABNORMAL HIGH (ref 1.7–7.7)
Neutrophils Relative %: 87 %
Platelets: 411 10*3/uL — ABNORMAL HIGH (ref 150–400)
RBC: 4.29 MIL/uL (ref 4.22–5.81)
RDW: 14.7 % (ref 11.5–15.5)
WBC: 12.1 10*3/uL — ABNORMAL HIGH (ref 4.0–10.5)
nRBC: 0 % (ref 0.0–0.2)

## 2021-01-18 LAB — COMPREHENSIVE METABOLIC PANEL
ALT: 31 U/L (ref 0–44)
AST: 21 U/L (ref 15–41)
Albumin: 3.5 g/dL (ref 3.5–5.0)
Alkaline Phosphatase: 71 U/L (ref 38–126)
Anion gap: 8 (ref 5–15)
BUN: 18 mg/dL (ref 8–23)
CO2: 32 mmol/L (ref 22–32)
Calcium: 8.6 mg/dL — ABNORMAL LOW (ref 8.9–10.3)
Chloride: 97 mmol/L — ABNORMAL LOW (ref 98–111)
Creatinine, Ser: 0.95 mg/dL (ref 0.61–1.24)
GFR, Estimated: >60 mL/min (ref >60)
Glucose, Bld: 118 mg/dL — ABNORMAL HIGH (ref 70–99)
Potassium: 4.2 mmol/L (ref 3.5–5.1)
Sodium: 137 mmol/L (ref 135–145)
Total Bilirubin: 0.4 mg/dL (ref 0.3–1.2)
Total Protein: 6.4 g/dL — ABNORMAL LOW (ref 6.5–8.1)

## 2021-01-18 LAB — RESP PANEL BY RT-PCR (FLU A&B, COVID) ARPGX2
Influenza A by PCR: NEGATIVE
Influenza B by PCR: NEGATIVE
SARS Coronavirus 2 by RT PCR: NEGATIVE

## 2021-01-18 MED ORDER — PREDNISONE 20 MG PO TABS: 40.0000 mg | ORAL_TABLET | Freq: Every day | ORAL | Status: DC

## 2021-01-18 MED ORDER — IPRATROPIUM BROMIDE 0.02 % IN SOLN
0.5000 mg | Freq: Once | RESPIRATORY_TRACT | Status: AC
  Administered 2021-01-18: 0.5 mg via RESPIRATORY_TRACT
  Filled 2021-01-18: qty 2.5

## 2021-01-18 MED ORDER — UMECLIDINIUM BROMIDE 62.5 MCG/INH IN AEPB
1.0000 | INHALATION_SPRAY | Freq: Every day | RESPIRATORY_TRACT | Status: DC
  Administered 2021-01-19: 1 via RESPIRATORY_TRACT
  Filled 2021-01-18: qty 7

## 2021-01-18 MED ORDER — HEPARIN SODIUM (PORCINE) 5000 UNIT/ML IJ SOLN
5000.0000 [IU] | Freq: Three times a day (TID) | INTRAMUSCULAR | Status: DC
  Administered 2021-01-19 – 2021-01-25 (×20): 5000 [IU] via SUBCUTANEOUS
  Filled 2021-01-18 (×21): qty 1

## 2021-01-18 MED ORDER — PANTOPRAZOLE SODIUM 40 MG PO TBEC
40.0000 mg | DELAYED_RELEASE_TABLET | Freq: Two times a day (BID) | ORAL | Status: DC
  Administered 2021-01-19 – 2021-01-24 (×11): 40 mg via ORAL
  Filled 2021-01-18 (×11): qty 1

## 2021-01-18 MED ORDER — ACETAMINOPHEN 650 MG RE SUPP: 650.0000 mg | Freq: Four times a day (QID) | RECTAL | Status: DC | PRN

## 2021-01-18 MED ORDER — IPRATROPIUM-ALBUTEROL 0.5-2.5 (3) MG/3ML IN SOLN
3.0000 mL | Freq: Four times a day (QID) | RESPIRATORY_TRACT | Status: DC
  Administered 2021-01-19 (×3): 3 mL via RESPIRATORY_TRACT
  Filled 2021-01-18 (×3): qty 3

## 2021-01-18 MED ORDER — ONDANSETRON HCL 4 MG PO TABS: 4.0000 mg | ORAL_TABLET | Freq: Four times a day (QID) | ORAL | Status: DC | PRN

## 2021-01-18 MED ORDER — METHYLPREDNISOLONE SODIUM SUCC 125 MG IJ SOLR
125.0000 mg | Freq: Every day | INTRAMUSCULAR | Status: AC
  Administered 2021-01-19: 125 mg via INTRAVENOUS
  Filled 2021-01-18: qty 2

## 2021-01-18 MED ORDER — ALBUTEROL SULFATE (2.5 MG/3ML) 0.083% IN NEBU
2.5000 mg | INHALATION_SOLUTION | Freq: Once | RESPIRATORY_TRACT | Status: AC
  Administered 2021-01-18: 2.5 mg via RESPIRATORY_TRACT
  Filled 2021-01-18: qty 3

## 2021-01-18 MED ORDER — ACETAMINOPHEN 325 MG PO TABS: 650.0000 mg | ORAL_TABLET | Freq: Four times a day (QID) | ORAL | Status: DC | PRN

## 2021-01-18 MED ORDER — NICOTINE 21 MG/24HR TD PT24
21.0000 mg | MEDICATED_PATCH | Freq: Every day | TRANSDERMAL | Status: DC
  Administered 2021-01-19 – 2021-01-30 (×12): 21 mg via TRANSDERMAL
  Filled 2021-01-18 (×13): qty 1

## 2021-01-18 MED ORDER — CARVEDILOL 3.125 MG PO TABS
6.2500 mg | ORAL_TABLET | Freq: Two times a day (BID) | ORAL | Status: DC
  Administered 2021-01-19 – 2021-01-25 (×13): 6.25 mg via ORAL
  Filled 2021-01-18 (×13): qty 2

## 2021-01-18 MED ORDER — METHYLPREDNISOLONE SODIUM SUCC 125 MG IJ SOLR
125.0000 mg | Freq: Once | INTRAMUSCULAR | Status: AC
  Administered 2021-01-18: 125 mg via INTRAVENOUS
  Filled 2021-01-18: qty 2

## 2021-01-18 MED ORDER — ALBUTEROL SULFATE (2.5 MG/3ML) 0.083% IN NEBU
2.5000 mg | INHALATION_SOLUTION | RESPIRATORY_TRACT | Status: DC | PRN
  Administered 2021-01-19 – 2021-01-24 (×3): 2.5 mg via RESPIRATORY_TRACT
  Filled 2021-01-18 (×3): qty 3

## 2021-01-18 MED ORDER — ASPIRIN EC 81 MG PO TBEC
81.0000 mg | DELAYED_RELEASE_TABLET | Freq: Every day | ORAL | Status: DC
  Administered 2021-01-19 – 2021-01-24 (×6): 81 mg via ORAL
  Filled 2021-01-18 (×6): qty 1

## 2021-01-18 MED ORDER — SODIUM CHLORIDE 0.9 % IV SOLN: INTRAVENOUS | Status: DC

## 2021-01-18 MED ORDER — LISINOPRIL 10 MG PO TABS
20.0000 mg | ORAL_TABLET | Freq: Every day | ORAL | Status: DC
  Administered 2021-01-20 – 2021-01-24 (×5): 20 mg via ORAL
  Filled 2021-01-18 (×6): qty 2

## 2021-01-18 MED ORDER — METHOCARBAMOL 1000 MG/10ML IJ SOLN
500.0000 mg | Freq: Four times a day (QID) | INTRAMUSCULAR | Status: DC | PRN
  Filled 2021-01-18: qty 5

## 2021-01-18 MED ORDER — HYDROXYUREA 500 MG PO CAPS
500.0000 mg | ORAL_CAPSULE | Freq: Every day | ORAL | Status: DC
Start: 2021-01-19 — End: 2021-01-25
  Administered 2021-01-19 – 2021-01-25 (×7): 500 mg via ORAL
  Filled 2021-01-18 (×8): qty 1

## 2021-01-18 MED ORDER — ARFORMOTEROL TARTRATE 15 MCG/2ML IN NEBU
15.0000 ug | INHALATION_SOLUTION | Freq: Two times a day (BID) | RESPIRATORY_TRACT | Status: DC
  Administered 2021-01-19 – 2021-01-24 (×12): 15 ug via RESPIRATORY_TRACT
  Filled 2021-01-18 (×12): qty 2

## 2021-01-18 MED ORDER — ONDANSETRON HCL 4 MG/2ML IJ SOLN: 4.0000 mg | Freq: Four times a day (QID) | INTRAMUSCULAR | Status: DC | PRN

## 2021-01-18 MED ORDER — ATORVASTATIN CALCIUM 40 MG PO TABS
80.0000 mg | ORAL_TABLET | Freq: Every day | ORAL | Status: DC
  Administered 2021-01-19 – 2021-01-24 (×6): 80 mg via ORAL
  Filled 2021-01-18 (×6): qty 2

## 2021-01-18 MED ORDER — SUCRALFATE 1 G PO TABS
1.0000 g | ORAL_TABLET | Freq: Three times a day (TID) | ORAL | Status: DC
  Administered 2021-01-19 – 2021-01-25 (×23): 1 g via ORAL
  Filled 2021-01-18 (×23): qty 1

## 2021-01-18 NOTE — ED Notes (Signed)
Resp paged to place pt on BiPAP

## 2021-01-18 NOTE — Progress Notes (Signed)
Attempt to calls spouse. No answer. Left VM.

## 2021-01-18 NOTE — ED Triage Notes (Signed)
Pt to er room number one via ems, per ems pt is here for sob and decreased LOC, per ems pt called 911 earlier and was given some O2 and felt better, states that when they found him this time he was satting in the 32s on room air, states that they put him on a NRB and his sat improved, pt 100% on 10L nrb, pt reduced to 6L O2 via [place], pt satting 100 %, pt arouses to verbal stim

## 2021-01-18 NOTE — H&P (Signed)
TRH H&P    Patient Demographics:    Jonathan Rosales, is a 70 y.o. male  MRN: [id number]  DOB - 1950-07-31  Admit Date - 01/18/2021  Referring MD/NP/PA: Rogene Houston  Outpatient Primary MD for the patient is Claretta Fraise, MD  Patient coming from: home  Chief complaint- dyspnea and altered mental status   HPI:    Jonathan Rosales  is a 69 y.o. male, with current tobacco use, COPD, anxiety, CKD, coronary artery disease, hypertension, and more presents the ED with a chief complaint of dyspnea and altered mental status.  Patient is quite fatigued and on the BiPAP.  He awakens to voice, and tries to answer questions but is still altered.  He reports he is not sure why he is here.  He is oriented x3.  He reports he is not in any pain.  Chart review reveals that EMS had been called out to the house for shortness of breath.  At that time they put him on a couple liters of oxygen, he started feeling better and he decided not to be transported to the hospital.  He was then noted to have decreased level of consciousness per family, so EMS was called again.  When they arrived the second time his oxygen saturations were in the 70s.  They put patient on nonrebreather and he improved to 100%.  Upon arriving to the ED he was able to be weaned down to 4 L, but was still very somnolent.  VBG was done that showed CO2 retention so patient was started on BiPAP.  Patient was given steroids and breathing treatments.  Chest x-ray showed no acute disease but did show COPD/emphysema.  EKG was similar to previous with more motion artifact, and tachycardia.  Patient had a slight leukocytosis at 12.1, hemoglobin stable at 16.2.  Chemistry panel is unremarkable.  Negative COVID.  Admission was requested for further management of COPD exacerbation.  This history is limited by acuity of illness requiring BiPAP, altered mental status of the patient.     Review of systems:    Review of systems was not able to be obtained due to altered mental status    Past History of the following :    Past Medical History:  Diagnosis Date   Allergy    Anemia    Anxiety    Blood transfusion without reported diagnosis    Cataract    Chronic kidney disease    COPD (chronic obstructive pulmonary disease) ([place])    Coronary artery disease    COVID    Emphysema of lung ([place])    Hypertension       Past Surgical History:  Procedure Laterality Date   CARDIAC SURGERY     CORONARY ARTERY BYPASS GRAFT     REPAIR OF PERFORATED ULCER  2017      Social History:      Social History   Tobacco Use   Smoking status: Every Day    Packs/day: 1.00    Types: Cigarettes   Smokeless tobacco: Never  Substance Use Topics  Alcohol use: No       Family History :     Family History  Problem Relation Age of Onset   COPD Mother    Depression Mother    Diabetes Mother    Kidney disease Mother    Heart disease Father    Alcohol abuse Brother       Home Medications:   Prior to Admission medications   Medication Sig Start Date End Date Taking? Authorizing Provider  acetaminophen (TYLENOL) 500 MG tablet Take 500 mg by mouth daily as needed for headache (pain).     [provider]  albuterol (VENTOLIN HFA) 108 (90 Base) MCG/ACT inhaler INHALE 2 PUFFS EVERY 6 HOURS AS NEEDED FOR WHEEZING OR SHORTNESS OF BREATH Patient taking differently: Inhale 2 puffs into the lungs every 6 (six) hours as needed for shortness of breath. 10/09/20   Claretta Fraise, MD  aspirin EC 81 MG tablet Take 81 mg by mouth at bedtime.     [provider]  atorvastatin (LIPITOR) 80 MG tablet Take 1 tablet (80 mg total) by mouth at bedtime. 10/30/20   Claretta Fraise, MD  carvedilol (COREG) 6.25 MG tablet TAKE 1 TABLET 2 TIMES A DAY WITH A MEAL 10/30/20   Claretta Fraise, MD  clonazePAM (KLONOPIN) 0.5 MG tablet Take 0.5 tablets (0.25 mg total) by mouth daily as needed  for anxiety. 09/04/20   Claretta Fraise, MD  escitalopram (LEXAPRO) 10 MG tablet Take 1 tablet (10 mg total) by mouth daily. Patient not taking: No sig reported 10/30/20   Claretta Fraise, MD  ferrous sulfate (FEROSUL) 325 (65 FE) MG tablet Take 1 tablet (325 mg total) by mouth daily with breakfast. 10/30/20   Claretta Fraise, MD  fluticasone (FLONASE) 50 MCG/ACT nasal spray Place 2 sprays into both nostrils daily as needed (sinus headache).     [provider]  hydroxyurea (HYDREA) 500 MG capsule Take 1 capsule (500 mg total) by mouth daily. May take with food to minimize GI side effects. 01/01/21   Nicholas Lose, MD  lisinopril (ZESTRIL) 20 MG tablet TAKE 1 TABLET EVERY DAY 01/11/21   Claretta Fraise, MD  loratadine (CLARITIN) 10 MG tablet Take 10 mg by mouth daily as needed (seasonall allergies).     [provider]  ondansetron (ZOFRAN-ODT) 8 MG disintegrating tablet Take 1 tablet (8 mg total) by mouth every 6 (six) hours as needed for nausea or vomiting. Patient not taking: No sig reported 05/15/20   Claretta Fraise, MD  pantoprazole (PROTONIX) 40 MG tablet Take 1 tablet (40 mg total) by mouth 2 (two) times daily. 10/30/20   Claretta Fraise, MD  polyethylene glycol (MIRALAX) 17 g packet Take 17 g by mouth daily. Dissolve one cap full in solution (water, gatorade, etc.) and administer once cap-full daily. You may titrate up daily by 1 cap-full until the patient is having pudding consistency of stools. After the patient is able to start passing softer stools they will need to be on 1/2 cap-full daily for 2 weeks. 01/06/21   Couture, Cortni S, PA-C  STIOLTO RESPIMAT 2.5-2.5 MCG/ACT AERS Inhale 2 puffs into the lungs daily. 12/25/20   Claretta Fraise, MD  sucralfate (CARAFATE) 1 g tablet TAKE 1 TABLET FOUR TIMES DAILY BEFORE MEALS & AT BEDTIME 10/30/20   Claretta Fraise, MD  vitamin B-12 (CYANOCOBALAMIN) 1000 MCG tablet Take 1,000 mcg by mouth daily. Patient not taking: Reported on 01/06/2021    [provider]     Allergies:  Allergies  Allergen Reactions   Duloxetine Hcl Nausea And Vomiting   Other Other (See Comments)     Physical Exam:   Vitals  Blood pressure (!) 133/98, pulse 93, temperature 98.4 F (36.9 C), resp. rate (!) 22, height '5\' 8"'$  (1.727 m), weight 54.4 kg, SpO2 99 %.  1.  General: Patient lying supine in bed,  no acute distress   2. Psychiatric: Somnolent and oriented x 3, cooperative with exam   3. Neurologic: Speech and language are difficult to assess at this time as they are muffled by BiPAP mask, face is symmetric, moves all 4 extremities voluntarily, altered with some confusion, but no focal deficits on limited exam   4. HEENMT:  Head is atraumatic, normocephalic, pupils reactive to light, neck is supple, trachea is midline, mucous membranes are moist   5. Respiratory : Lungs are  with mild wheezing, rhonchi, no rales, no cyanosis, using accessory muscles, maintaining oxygen saturations on BiPAP   6. Cardiovascular : Heart rate slightly tachycardic, rhythm is regular, no murmurs, rubs or gallops, 2+ pitting edema in the lower extremities bilaterally  7. Gastrointestinal:  Abdomen is soft, nondistended, nontender to palpation bowel sounds active, no masses or organomegaly palpated   8. Skin:  Skin is warm, dry and intact without rashes, acute lesions, or ulcers on limited exam   9.Musculoskeletal:  No acute deformities or trauma, no asymmetry in tone, 2+ pitting edema peripherally, peripheral pulses palpated, no tenderness to palpation in the extremities     Data Review:    CBC Recent Labs  Lab 01/18/21 1759  WBC 12.1*  HGB 16.2  HCT 49.5  PLT 411*  MCV 115.4*  MCH 37.8*  MCHC 32.7  RDW 14.7  LYMPHSABS 0.6*  MONOABS 0.8  EOSABS 0.0  BASOSABS 0.1   ------------------------------------------------------------------------------------------------------------------  Results for orders placed or performed during the  hospital encounter of 01/18/21 (from the past 48 hour(s))  Resp Panel by RT-PCR (Flu A&B, Covid) Nasopharyngeal Swab     Status: None   Collection Time: 01/18/21  5:54 PM   Specimen: Nasopharyngeal Swab; Nasopharyngeal(NP) swabs in vial transport medium  Result Value Ref Range   SARS Coronavirus 2 by RT PCR NEGATIVE NEGATIVE    Comment: (NOTE) SARS-CoV-2 target nucleic acids are NOT DETECTED.  The SARS-CoV-2 RNA is generally detectable in upper respiratory specimens during the acute phase of infection. The lowest concentration of SARS-CoV-2 viral copies this assay can detect is 138 copies/mL. A negative result does not preclude SARS-Cov-2 infection and should not be used as the sole basis for treatment or other patient management decisions. A negative result may occur with  improper specimen collection/handling, submission of specimen other than nasopharyngeal swab, presence of viral mutation(s) within the areas targeted by this assay, and inadequate number of viral copies(<138 copies/mL). A negative result must be combined with clinical observations, patient history, and epidemiological information. The expected result is Negative.  Fact Sheet for Patients:  EntrepreneurPulse.com.au  Fact Sheet for Healthcare Providers:  IncredibleEmployment.be  This test is no t yet approved or cleared by the [place] FDA and  has been authorized for detection and/or diagnosis of SARS-CoV-2 by FDA under an Emergency Use Authorization (EUA). This EUA will remain  in effect (meaning this test can be used) for the duration of the COVID-19 declaration under Section 564(b)(1) of the Act, 21 U.S.C.section 360bbb-3(b)(1), unless the authorization is terminated  or revoked sooner.       Influenza A by PCR NEGATIVE NEGATIVE  Influenza B by PCR NEGATIVE NEGATIVE    Comment: (NOTE) The Xpert Xpress SARS-CoV-2/FLU/RSV plus assay is intended as an aid in the  diagnosis of influenza from Nasopharyngeal swab specimens and should not be used as a sole basis for treatment. Nasal washings and aspirates are unacceptable for Xpert Xpress SARS-CoV-2/FLU/RSV testing.  Fact Sheet for Patients: EntrepreneurPulse.com.au  Fact Sheet for Healthcare Providers: IncredibleEmployment.be  This test is not yet approved or cleared by the [place] FDA and has been authorized for detection and/or diagnosis of SARS-CoV-2 by FDA under an Emergency Use Authorization (EUA). This EUA will remain in effect (meaning this test can be used) for the duration of the COVID-19 declaration under Section 564(b)(1) of the Act, 21 U.S.C. section 360bbb-3(b)(1), unless the authorization is terminated or revoked.  Performed at [facility], [address] [place], [place] [address]   Comprehensive metabolic panel     Status: Abnormal   Collection Time: 01/18/21  5:59 PM  Result Value Ref Range   Sodium 137 135 - 145 mmol/L   Potassium 4.2 3.5 - 5.1 mmol/L   Chloride 97 (L) 98 - 111 mmol/L   CO2 32 22 - 32 mmol/L   Glucose, Bld 118 (H) 70 - 99 mg/dL    Comment: Glucose reference range applies only to samples taken after fasting for at least 8 hours.   BUN 18 8 - 23 mg/dL   Creatinine, Ser 0.95 0.61 - 1.24 mg/dL   Calcium 8.6 (L) 8.9 - 10.3 mg/dL   Total Protein 6.4 (L) 6.5 - 8.1 g/dL   Albumin 3.5 3.5 - 5.0 g/dL   AST 21 15 - 41 U/L   ALT 31 0 - 44 U/L   Alkaline Phosphatase 71 38 - 126 U/L   Total Bilirubin 0.4 0.3 - 1.2 mg/dL   GFR, Estimated >60 >60 mL/min    Comment: (NOTE) Calculated using the CKD-EPI Creatinine Equation (2021)    Anion gap 8 5 - 15    Comment: Performed at [facility], [address], [place], [place] [address]  CBC with Differential/Platelet     Status: Abnormal   Collection Time: 01/18/21  5:59 PM  Result Value Ref Range   WBC 12.1 (H) 4.0 - 10.5 K/uL   RBC 4.29 4.22 - 5.81 MIL/uL   Hemoglobin 16.2  13.0 - 17.0 g/dL   HCT 49.5 39.0 - 52.0 %   MCV 115.4 (H) 80.0 - 100.0 fL   MCH 37.8 (H) 26.0 - 34.0 pg   MCHC 32.7 30.0 - 36.0 g/dL   RDW 14.7 11.5 - 15.5 %   Platelets 411 (H) 150 - 400 K/uL   nRBC 0.0 0.0 - 0.2 %   Neutrophils Relative % 87 %   Neutro Abs 10.6 (H) 1.7 - 7.7 K/uL   Lymphocytes Relative 5 %   Lymphs Abs 0.6 (L) 0.7 - 4.0 K/uL   Monocytes Relative 6 %   Monocytes Absolute 0.8 0.1 - 1.0 K/uL   Eosinophils Relative 0 %   Eosinophils Absolute 0.0 0.0 - 0.5 K/uL   Basophils Relative 1 %   Basophils Absolute 0.1 0.0 - 0.1 K/uL   RBC Morphology MACROCYTES PRESENT    Immature Granulocytes 1 %   Abs Immature Granulocytes 0.08 (H) 0.00 - 0.07 K/uL    Comment: Performed at [facility], [address], [place], [place] [address]  Blood gas, venous (at [facility] and [facility], not at [facility])     Status: Abnormal   Collection Time:  01/18/21  5:59 PM  Result Value Ref Range   FIO2 36.00    pH, Ven 7.235 (L) 7.250 - 7.430   pCO2, Ven 78.2 (HH) 44.0 - 60.0 mmHg    Comment: CRITICAL RESULT CALLED TO, READ BACK BY AND VERIFIED WITH: TALBOTT,T ON 01/18/21 AT 1815 BY LOY,C    pO2, Ven 96.6 (H) 32.0 - 45.0 mmHg   Bicarbonate 25.6 20.0 - 28.0 mmol/L   Acid-Base Excess 4.9 (H) 0.0 - 2.0 mmol/L   O2 Saturation 94.9 %   Patient temperature 36.9     Comment: Performed at [facility], [address] [place], [place] [address]    Chemistries  Recent Labs  Lab 01/18/21 1759  NA 137  K 4.2  CL 97*  CO2 32  GLUCOSE 118*  BUN 18  CREATININE 0.95  CALCIUM 8.6*  AST 21  ALT 31  ALKPHOS 71  BILITOT 0.4   ------------------------------------------------------------------------------------------------------------------  ------------------------------------------------------------------------------------------------------------------ GFR: Estimated Creatinine Clearance: 54.9 mL/min (by C-G formula based on SCr of 0.95 mg/dL). Liver Function Tests: Recent Labs  Lab 01/18/21 1759  AST 21   ALT 31  ALKPHOS 71  BILITOT 0.4  PROT 6.4*  ALBUMIN 3.5   No results for input(s): LIPASE, AMYLASE in the last 168 hours. No results for input(s): AMMONIA in the last 168 hours. Coagulation Profile: No results for input(s): INR, PROTIME in the last 168 hours. Cardiac Enzymes: No results for input(s): CKTOTAL, CKMB, CKMBINDEX, TROPONINI in the last 168 hours. BNP (last 3 results) No results for input(s): PROBNP in the last 8760 hours. HbA1C: No results for input(s): HGBA1C in the last 72 hours. CBG: No results for input(s): GLUCAP in the last 168 hours. Lipid Profile: No results for input(s): CHOL, HDL, LDLCALC, TRIG, CHOLHDL, LDLDIRECT in the last 72 hours. Thyroid Function Tests: No results for input(s): TSH, T4TOTAL, FREET4, T3FREE, THYROIDAB in the last 72 hours. Anemia Panel: No results for input(s): VITAMINB12, FOLATE, FERRITIN, TIBC, IRON, RETICCTPCT in the last 72 hours.  --------------------------------------------------------------------------------------------------------------- Urine analysis:    Component Value Date/Time   COLORURINE YELLOW 01/06/2021 1934   APPEARANCEUR CLEAR 01/06/2021 1934   APPEARANCEUR Clear 01/24/2020 0934   LABSPEC 1.010 01/06/2021 1934   PHURINE 6.0 01/06/2021 1934   GLUCOSEU NEGATIVE 01/06/2021 1934   HGBUR NEGATIVE 01/06/2021 1934   BILIRUBINUR NEGATIVE 01/06/2021 1934   BILIRUBINUR Negative 01/24/2020 [place] 01/06/2021 1934   PROTEINUR NEGATIVE 01/06/2021 1934   NITRITE NEGATIVE 01/06/2021 1934   LEUKOCYTESUR NEGATIVE 01/06/2021 1934      Imaging Results:    DG Chest Port 1 View  Result Date: 01/18/2021 CLINICAL DATA:  Shortness of breath. Decreased level consciousness. Hypoxia. COPD. Coronary artery disease. EXAM: PORTABLE CHEST 1 VIEW COMPARISON:  09/18/2020 FINDINGS: The heart size and mediastinal contours are within normal limits. Prior CABG again noted. Pulmonary hyperinflation and diffuse interstitial  prominence are stable, consistent with COPD. No evidence of acute pulmonary infiltrate or pleural effusion. IMPRESSION: COPD. No active disease. Electronically Signed   By: Marlaine Hind M.D.   On: 01/18/2021 18:40    My personal review of EKG: Rhythm sinus tachycardia with a rate of 110/min, QTc 466 , nonspecific repolarization abnormality that is similar to previous   Assessment & Plan:    Active Problems:   Essential hypertension   Gastroesophageal reflux disease without esophagitis   Current smoker   COPD exacerbation (HCC)   Acute respiratory failure with hypoxia and hypercapnia (HCC)   Acute metabolic encephalopathy  COPD exacerbation Patient with history of COPD, current smoker Blood gas shows a pH of 7.2, CO2 of 78 Started on BiPAP Received albuterol and Atrovent in the ED Continue scheduled DuoNebs, as needed albuterol, steroids No evidence of pneumonia at this time, no antibiotics indicated Patient takes Klonopin 0.25 mg daily at home, holding this in the setting of respiratory failure secondary to COPD Continue to trend blood gas, although patient is already clinically improving on BiPAP Acute hypoxic respiratory failure with hypercapnia Oxygen sats reported by EMS to be in the 70s upon arrival Oxygen saturations are improved at this time on BiPAP 35% FiO2 CO2 on blood gas was 78 Continue BiPAP This is all secondary to COPD exacerbation Continue to monitor Acute metabolic encephalopathy With decreased level consciousness and some confusion-patient does not know why he is in the hospital Secondary to CO2 retention and hypoxia Mental status already improving on BiPAP Continue to monitor Current smoker Counseled on cessation Nicotine patch ordered GERD Continue PPI Hypertension Continue lisinopril, Coreg CAD Continue lisinopril, Coreg, statin, aspirin   DVT Prophylaxis-Heparin- SCDs   AM Labs Ordered, also please review Full Orders  Family Communication:  no  family at bedside  Code Status: Full  Admission status: Inpatient :The appropriate admission status for this patient is INPATIENT. Inpatient status is judged to be reasonable and necessary in order to provide the required intensity of service to ensure the patient's safety. The patient's presenting symptoms, physical exam findings, and initial radiographic and laboratory data in the context of their chronic comorbidities is felt to place them at high risk for further clinical deterioration. Furthermore, it is not anticipated that the patient will be medically stable for discharge from the hospital within 2 midnights of admission. The following factors support the admission status of inpatient.     The patient's presenting symptoms include altered mental status and dyspnea. The worrisome physical exam findings include hypoxia, encephalopathy. The initial radiographic and laboratory data are worrisome because of PCO2 78, pH 7.2. The chronic co-morbidities include CAD, hypertension, anxiety, GERD.       * I certify that at the point of admission it is my clinical judgment that the patient will require inpatient hospital care spanning beyond 2 midnights from the point of admission due to high intensity of service, high risk for further deterioration and high frequency of surveillance required.*  Time spent in minutes : [place]

## 2021-01-18 NOTE — ED Provider Notes (Addendum)
[facility] Provider Note   CSN: [id number] Arrival date & time: 01/18/21  1714     History Chief Complaint  Patient presents with   Shortness of Breath    Jonathan Rosales is a 70 y.o. male.  Patient brought in by EMS.  Per EMS patient here for shortness of breath and decreased level of consciousness.  Per EMS patient called 911 earlier and was given some O2 and felt better.  States that when they found him this time he was satting in the 70s on room air.  States that they put him on nonrebreather sats improved to the 100%.  Patient arouses to verbal stimuli.  Patient with some belly breathing.  Was able to titrate his oxygen to 4 L nasal cannula and his oxygen sats were in the low 90s.  Suspicious patient may be retaining CO2.  Past medical history is significant for emphysema of the lung COPD coronary artery disease hypertension.      Past Medical History:  Diagnosis Date   Allergy    Anemia    Anxiety    Blood transfusion without reported diagnosis    Cataract    Chronic kidney disease    COPD (chronic obstructive pulmonary disease) ([place])    Coronary artery disease    COVID    Emphysema of lung ([place])    Hypertension     Patient Active Problem List   Diagnosis Date Noted   Current smoker 10/25/2019   Macrocytosis without anemia 12/15/2017   Essential thrombocytosis ([place]) 12/15/2017   CKD (chronic kidney disease), stage III ([place]) 11/17/2017   Coronary artery disease involving native heart without angina pectoris 11/17/2017   Essential hypertension 11/17/2017   Chronic obstructive pulmonary disease ([place]) 11/17/2017   History of ulcer disease 11/17/2017   Gastroesophageal reflux disease without esophagitis 11/17/2017    Past Surgical History:  Procedure Laterality Date   CARDIAC SURGERY     CORONARY ARTERY BYPASS GRAFT     REPAIR OF PERFORATED ULCER  2017       Family History  Problem Relation Age of Onset   COPD Mother    Depression  Mother    Diabetes Mother    Kidney disease Mother    Heart disease Father    Alcohol abuse Brother     Social History   Tobacco Use   Smoking status: Every Day    Packs/day: 1.00    Types: Cigarettes   Smokeless tobacco: Never  Vaping Use   Vaping Use: Never used  Substance Use Topics   Alcohol use: No   Drug use: No    Home Medications Prior to Admission medications   Medication Sig Start Date End Date Taking? Authorizing Provider  acetaminophen (TYLENOL) 500 MG tablet Take 500 mg by mouth daily as needed for headache (pain).     [provider]  albuterol (VENTOLIN HFA) 108 (90 Base) MCG/ACT inhaler INHALE 2 PUFFS EVERY 6 HOURS AS NEEDED FOR WHEEZING OR SHORTNESS OF BREATH Patient taking differently: Inhale 2 puffs into the lungs every 6 (six) hours as needed for shortness of breath. 10/09/20   Claretta Fraise, MD  aspirin EC 81 MG tablet Take 81 mg by mouth at bedtime.     [provider]  atorvastatin (LIPITOR) 80 MG tablet Take 1 tablet (80 mg total) by mouth at bedtime. 10/30/20   Claretta Fraise, MD  carvedilol (COREG) 6.25 MG tablet TAKE 1 TABLET 2 TIMES A DAY WITH A MEAL 10/30/20  Claretta Fraise, MD  clonazePAM (KLONOPIN) 0.5 MG tablet Take 0.5 tablets (0.25 mg total) by mouth daily as needed for anxiety. 09/04/20   Claretta Fraise, MD  escitalopram (LEXAPRO) 10 MG tablet Take 1 tablet (10 mg total) by mouth daily. Patient not taking: No sig reported 10/30/20   Claretta Fraise, MD  ferrous sulfate (FEROSUL) 325 (65 FE) MG tablet Take 1 tablet (325 mg total) by mouth daily with breakfast. 10/30/20   Claretta Fraise, MD  fluticasone (FLONASE) 50 MCG/ACT nasal spray Place 2 sprays into both nostrils daily as needed (sinus headache).     [provider]  hydroxyurea (HYDREA) 500 MG capsule Take 1 capsule (500 mg total) by mouth daily. May take with food to minimize GI side effects. 01/01/21   Nicholas Lose, MD  lisinopril (ZESTRIL) 20 MG tablet TAKE 1 TABLET  EVERY DAY 01/11/21   Claretta Fraise, MD  loratadine (CLARITIN) 10 MG tablet Take 10 mg by mouth daily as needed (seasonall allergies).     [provider]  ondansetron (ZOFRAN-ODT) 8 MG disintegrating tablet Take 1 tablet (8 mg total) by mouth every 6 (six) hours as needed for nausea or vomiting. Patient not taking: No sig reported 05/15/20   Claretta Fraise, MD  pantoprazole (PROTONIX) 40 MG tablet Take 1 tablet (40 mg total) by mouth 2 (two) times daily. 10/30/20   Claretta Fraise, MD  polyethylene glycol (MIRALAX) 17 g packet Take 17 g by mouth daily. Dissolve one cap full in solution (water, gatorade, etc.) and administer once cap-full daily. You may titrate up daily by 1 cap-full until the patient is having pudding consistency of stools. After the patient is able to start passing softer stools they will need to be on 1/2 cap-full daily for 2 weeks. 01/06/21   Couture, Cortni S, PA-C  STIOLTO RESPIMAT 2.5-2.5 MCG/ACT AERS Inhale 2 puffs into the lungs daily. 12/25/20   Claretta Fraise, MD  sucralfate (CARAFATE) 1 g tablet TAKE 1 TABLET FOUR TIMES DAILY BEFORE MEALS & AT BEDTIME 10/30/20   Claretta Fraise, MD  vitamin B-12 (CYANOCOBALAMIN) 1000 MCG tablet Take 1,000 mcg by mouth daily. Patient not taking: Reported on 01/06/2021    [provider]    Allergies    Duloxetine hcl and Other  Review of Systems   Review of Systems  Unable to perform ROS: Mental status change   Physical Exam Updated Vital Signs BP (!) 133/98   Pulse 93   Temp 98.4 F (36.9 C) (Oral)   Resp (!) 22   Ht 1.727 m ('5\' 8"'$ )   Wt 54.4 kg   SpO2 96%   BMI 18.25 kg/m   Physical Exam Vitals and nursing note reviewed.  Constitutional:      General: He is in acute distress.     Appearance: Normal appearance. He is well-developed. He is ill-appearing.  HENT:     Head: Normocephalic and atraumatic.  Eyes:     Extraocular Movements: Extraocular movements intact.     Conjunctiva/sclera: Conjunctivae normal.      Pupils: Pupils are equal, round, and reactive to light.  Cardiovascular:     Rate and Rhythm: Regular rhythm. Tachycardia present.     Heart sounds: No murmur heard. Pulmonary:     Effort: Respiratory distress present.     Breath sounds: Normal breath sounds. No stridor. No wheezing, rhonchi or rales.  Chest:     Chest wall: No tenderness.  Abdominal:     Palpations: Abdomen is soft.  Tenderness: There is no abdominal tenderness.  Musculoskeletal:        General: Swelling present.     Cervical back: Neck supple.  Skin:    General: Skin is warm and dry.  Neurological:     Mental Status: He is alert.     Comments: Patient very drowsy.  But will arouse to name and touch.    ED Results / Procedures / Treatments   Labs (all labs ordered are listed, but only abnormal results are displayed) Labs Reviewed  COMPREHENSIVE METABOLIC PANEL - Abnormal; Notable for the following components:      Result Value   Chloride 97 (*)    Glucose, Bld 118 (*)    Calcium 8.6 (*)    Total Protein 6.4 (*)    All other components within normal limits  CBC WITH DIFFERENTIAL/PLATELET - Abnormal; Notable for the following components:   WBC 12.1 (*)    MCV 115.4 (*)    MCH 37.8 (*)    Platelets 411 (*)    Neutro Abs 10.6 (*)    Lymphs Abs 0.6 (*)    Abs Immature Granulocytes 0.08 (*)    All other components within normal limits  BLOOD GAS, VENOUS - Abnormal; Notable for the following components:   pH, Ven 7.235 (*)    pCO2, Ven 78.2 (*)    pO2, Ven 96.6 (*)    Acid-Base Excess 4.9 (*)    All other components within normal limits  RESP PANEL BY RT-PCR (FLU A&B, COVID) [id number]    EKG EKG Interpretation  Date/Time:  Thursday January 18 2021 17:38:47 EDT Ventricular Rate:  110 PR Interval:  172 QRS Duration: 108 QT Interval:  344 QTC Calculation: 466 R Axis:   -82 Text Interpretation: Sinus tachycardia Atrial premature complex Probable left atrial enlargement LAD, consider left  anterior fascicular block Abnormal R-wave progression, late transition Left ventricular hypertrophy No significant change since last tracing Confirmed by Fredia Sorrow [phone]) on 01/18/2021 5:56:19 PM  Radiology DG Chest Port 1 View  Result Date: 01/18/2021 CLINICAL DATA:  Shortness of breath. Decreased level consciousness. Hypoxia. COPD. Coronary artery disease. EXAM: PORTABLE CHEST 1 VIEW COMPARISON:  09/18/2020 FINDINGS: The heart size and mediastinal contours are within normal limits. Prior CABG again noted. Pulmonary hyperinflation and diffuse interstitial prominence are stable, consistent with COPD. No evidence of acute pulmonary infiltrate or pleural effusion. IMPRESSION: COPD. No active disease. Electronically Signed   By: Marlaine Hind M.D.   On: 01/18/2021 18:40    Procedures Procedures   Medications Ordered in ED Medications  albuterol (PROVENTIL) (2.5 MG/3ML) 0.083% nebulizer solution 2.5 mg (2.5 mg Nebulization Given 01/18/21 1839)  methylPREDNISolone sodium succinate (SOLU-MEDROL) 125 mg/2 mL injection 125 mg (125 mg Intravenous Given 01/18/21 1822)  ipratropium (ATROVENT) nebulizer solution 0.5 mg (0.5 mg Nebulization Given 01/18/21 1839)    ED Course  I have reviewed the triage vital signs and the nursing notes.  Pertinent labs & imaging results that were available during my care of the patient were reviewed by me and considered in my medical decision making (see chart for details).    MDM Rules/Calculators/A&P                         CRITICAL CARE Performed by: Fredia Sorrow Total critical care time: 60 minutes Critical care time was exclusive of separately billable procedures and treating other patients. Critical care was necessary to treat or prevent imminent or life-threatening deterioration. Critical  care was time spent personally by me on the following activities: development of treatment plan with patient and/or surrogate as well as nursing, discussions with  consultants, evaluation of patient's response to treatment, examination of patient, obtaining history from patient or surrogate, ordering and performing treatments and interventions, ordering and review of laboratory studies, ordering and review of radiographic studies, pulse oximetry and re-evaluation of patient's condition.   Patient with respiratory distress.  Was able to wean him down to 4 L His oxygen sats in the low 90s.  Was suspicious that he was retaining CO2.  Venous blood gas showed a pH 7.23 PCO2 of 78.2.  Patient alert enough we started him on BiPAP.  Also gave nebulizer treatment and Solu-Medrol.  Chest x-ray negative for any acute findings other than the COPD emphysema.  I think this is an exacerbation of his COPD.  Patient still actively smokes.  I am surprised that he is not on home oxygen.  On the BiPAP I seems to be tolerating that fine and seems to be improving.  Says his breathing feels better.  Is moving air better.  We will continue to monitor if he needs intubation will intubate.  Discussed with hospitalist who will admit  COVID testing was negative. Final Clinical Impression(s) / ED Diagnoses Final diagnoses:  Acute respiratory failure with hypoxia and hypercapnia ([place])  COPD exacerbation [facility])    Rx / DC Orders ED Discharge Orders     None        Fredia Sorrow, MD 01/18/21 1924    Fredia Sorrow, MD 01/18/21 1924

## 2021-01-18 NOTE — Progress Notes (Signed)
Spoke to wife via phone. She is very concerned about whether or not patient needs home O2. We have discussed risks of over oxygenation, including decrease in respiratory drive. I have advised that when patient is trending towards baseline - he can be assessed for the use of home O2. Right now, no meaningful assessment re: home O2 is possible since he is in acute exacerbation. Patient's wife wants to know if there is a cure for COPD. We have discussed the most important thing for this patient is smoking cessation. We also discussed maintenance and rescue inhalers. Wife reports that patient was using rescue inhalers today without relief. She will need more education re: COPD, and expectations for his progression, etc.

## 2021-01-19 ENCOUNTER — Ambulatory Visit: Payer: Medicare Other | Admitting: Family Medicine

## 2021-01-19 DIAGNOSIS — F17200 Nicotine dependence, unspecified, uncomplicated: Secondary | ICD-10-CM | POA: Diagnosis not present

## 2021-01-19 DIAGNOSIS — G9341 Metabolic encephalopathy: Secondary | ICD-10-CM | POA: Diagnosis not present

## 2021-01-19 DIAGNOSIS — J441 Chronic obstructive pulmonary disease with (acute) exacerbation: Secondary | ICD-10-CM | POA: Diagnosis not present

## 2021-01-19 DIAGNOSIS — J9601 Acute respiratory failure with hypoxia: Secondary | ICD-10-CM | POA: Diagnosis not present

## 2021-01-19 LAB — BLOOD GAS, VENOUS
Acid-Base Excess: 6.3 mmol/L — ABNORMAL HIGH (ref 0.0–2.0)
Bicarbonate: 27.5 mmol/L (ref 20.0–28.0)
FIO2: 98
O2 Saturation: 78.1 %
Patient temperature: 36.4
pCO2, Ven: 61.5 mmHg — ABNORMAL HIGH (ref 44.0–60.0)
pH, Ven: 7.333 (ref 7.250–7.430)
pO2, Ven: 49.4 mmHg — ABNORMAL HIGH (ref 32.0–45.0)

## 2021-01-19 LAB — COMPREHENSIVE METABOLIC PANEL
ALT: 26 U/L (ref 0–44)
AST: 16 U/L (ref 15–41)
Albumin: 3 g/dL — ABNORMAL LOW (ref 3.5–5.0)
Alkaline Phosphatase: 62 U/L (ref 38–126)
Anion gap: 10 (ref 5–15)
BUN: 24 mg/dL — ABNORMAL HIGH (ref 8–23)
CO2: 29 mmol/L (ref 22–32)
Calcium: 8.2 mg/dL — ABNORMAL LOW (ref 8.9–10.3)
Chloride: 98 mmol/L (ref 98–111)
Creatinine, Ser: 1.15 mg/dL (ref 0.61–1.24)
GFR, Estimated: >60 mL/min (ref >60)
Glucose, Bld: 115 mg/dL — ABNORMAL HIGH (ref 70–99)
Potassium: 4.4 mmol/L (ref 3.5–5.1)
Sodium: 137 mmol/L (ref 135–145)
Total Bilirubin: 0.5 mg/dL (ref 0.3–1.2)
Total Protein: 5.6 g/dL — ABNORMAL LOW (ref 6.5–8.1)

## 2021-01-19 LAB — BLOOD GAS, ARTERIAL
Acid-Base Excess: 6.7 mmol/L — ABNORMAL HIGH (ref 0.0–2.0)
Bicarbonate: 28.4 mmol/L — ABNORMAL HIGH (ref 20.0–28.0)
FIO2: 30
O2 Saturation: 94.3 %
Patient temperature: 36.7
pCO2 arterial: 62.3 mmHg — ABNORMAL HIGH (ref 32.0–48.0)
pH, Arterial: 7.334 — ABNORMAL LOW (ref 7.350–7.450)
pO2, Arterial: 81.1 mmHg — ABNORMAL LOW (ref 83.0–108.0)

## 2021-01-19 LAB — CBC
HCT: 46.9 % (ref 39.0–52.0)
Hemoglobin: 15.5 g/dL (ref 13.0–17.0)
MCH: 38.3 pg — ABNORMAL HIGH (ref 26.0–34.0)
MCHC: 33 g/dL (ref 30.0–36.0)
MCV: 115.8 fL — ABNORMAL HIGH (ref 80.0–100.0)
Platelets: 364 10*3/uL (ref 150–400)
RBC: 4.05 MIL/uL — ABNORMAL LOW (ref 4.22–5.81)
RDW: 14.5 % (ref 11.5–15.5)
WBC: 8.7 10*3/uL (ref 4.0–10.5)
nRBC: 0 % (ref 0.0–0.2)

## 2021-01-19 LAB — GLUCOSE, CAPILLARY: Glucose-Capillary: 163 mg/dL — ABNORMAL HIGH (ref 70–99)

## 2021-01-19 LAB — MRSA NEXT GEN BY PCR, NASAL: MRSA by PCR Next Gen: NOT DETECTED

## 2021-01-19 MED ORDER — BUDESONIDE 0.5 MG/2ML IN SUSP
0.5000 mg | Freq: Two times a day (BID) | RESPIRATORY_TRACT | Status: DC
  Administered 2021-01-19 – 2021-01-25 (×12): 0.5 mg via RESPIRATORY_TRACT
  Filled 2021-01-19 (×12): qty 2

## 2021-01-19 MED ORDER — DOCUSATE SODIUM 100 MG PO CAPS
100.0000 mg | ORAL_CAPSULE | Freq: Once | ORAL | Status: AC
  Administered 2021-01-19: 100 mg via ORAL
  Filled 2021-01-19: qty 1

## 2021-01-19 MED ORDER — REVEFENACIN 175 MCG/3ML IN SOLN
175.0000 ug | Freq: Every day | RESPIRATORY_TRACT | Status: DC
  Administered 2021-01-20 – 2021-01-25 (×6): 175 ug via RESPIRATORY_TRACT
  Filled 2021-01-19 (×6): qty 3

## 2021-01-19 MED ORDER — METHYLPREDNISOLONE SODIUM SUCC 125 MG IJ SOLR
80.0000 mg | Freq: Two times a day (BID) | INTRAMUSCULAR | Status: DC
  Administered 2021-01-19 – 2021-01-25 (×12): 80 mg via INTRAVENOUS
  Filled 2021-01-19 (×12): qty 2

## 2021-01-19 MED ORDER — CHLORHEXIDINE GLUCONATE CLOTH 2 % EX PADS
6.0000 | MEDICATED_PAD | Freq: Every day | CUTANEOUS | Status: DC
  Administered 2021-01-19 – 2021-01-25 (×7): 6 via TOPICAL

## 2021-01-19 MED ORDER — DOXYCYCLINE HYCLATE 100 MG PO TABS
100.0000 mg | ORAL_TABLET | Freq: Two times a day (BID) | ORAL | Status: DC
  Administered 2021-01-19 – 2021-01-25 (×13): 100 mg via ORAL
  Filled 2021-01-19 (×13): qty 1

## 2021-01-19 NOTE — Care Management Important Message (Signed)
Important Message  Patient Details  Name: Jonathan Rosales MRN: [id number] Date of Birth: 10-04-1950   Medicare Important Message Given:  Yes     Tommy Medal 01/19/2021, 3:00 PM

## 2021-01-19 NOTE — Progress Notes (Signed)
Chaplain engaged in an initial visit with Jonathan Rosales.  Chaplain introduced herself and offered support.  Jonathan Rosales expressed that he has a faith community that he is a part of.  He also discussed when he had arrived at the hospital and that they are still working to find out what is happening.    Chaplain offered presence and listening.  Chaplain will follow-up.    01/19/21 1100  Clinical Encounter Type  Visited With Patient  Visit Type Initial

## 2021-01-19 NOTE — Progress Notes (Signed)
PROGRESS NOTE    Jonathan Rosales  [id number] DOB: [birth year] DOA: 01/18/2021 PCP: Claretta Fraise, MD   Chief Complaint  Patient presents with   Shortness of Breath    Brief admission Narrative:  As per H&P written by Dr. Clearence Ped on 01/18/21 Jonathan Rosales  is a 70 y.o. male, with current tobacco use, COPD, anxiety, CKD, coronary artery disease, hypertension, and more presents the ED with a chief complaint of dyspnea and altered mental status.  Patient is quite fatigued and on the BiPAP.  He awakens to voice, and tries to answer questions but is still altered.  He reports he is not sure why he is here.  He is oriented x3.  He reports he is not in any pain.  Chart review reveals that EMS had been called out to the house for shortness of breath.  At that time they put him on a couple liters of oxygen, he started feeling better and he decided not to be transported to the hospital.  He was then noted to have decreased level of consciousness per family, so EMS was called again.  When they arrived the second time his oxygen saturations were in the 70s.  They put patient on nonrebreather and he improved to 100%.  Upon arriving to the ED he was able to be weaned down to 4 L, but was still very somnolent.  VBG was done that showed CO2 retention so patient was started on BiPAP.  Patient was given steroids and breathing treatments.  Chest x-ray showed no acute disease but did show COPD/emphysema.  EKG was similar to previous with more motion artifact, and tachycardia.  Patient had a slight leukocytosis at 12.1, hemoglobin stable at 16.2.  Chemistry panel is unremarkable.  Negative COVID.  Admission was requested for further management of COPD exacerbation.   This history is limited by acuity of illness requiring BiPAP, altered mental status of the patient.     Assessment & Plan: 1-acute respiratory failure with hypoxia/hypercapnia in the setting of COPD exacerbation and bronchiectasis -Patient  requiring BiPAP due to increased work of breathing and CO2 retention. -Overall improved and now working to try to get patient off BiPAP and transition into nasal cannula supplementation. -Continue IV steroids, scheduled duo nebs, yulperi nebulizer and the use of Brovana and Pulmicort. -Patient will need outpatient follow-up with pulmonologist for PFTs and further adjustment to maintenance treatment. -Tobacco cessation counseling provided.   -continue BID PPI -Will treat with 5 days of doxycycline.  2-Essential hypertension -Pulmonal -Continue current antihypertensive regimen.  3-Gastroesophageal reflux disease without esophagitis -Continue PPI.  4-Current smoker -Cessation counseling provided -Continue nicotine patch.  5-Acute metabolic encephalopathy -In the setting of respiratory failure with hypercapnia and hypoxia. -Improve mentation back to baseline currently. -Continue supportive care and treatment for COPD exacerbation as mentioned above.  6-history of coronary artery disease -Continue lisinopril, Coreg, statin and aspirin -Patient denies chest pain.  DVT prophylaxis: SCDs Code Status: Full code Family Communication: No family at bedside; patient expressed that he will contact his wife for updates.  Disposition:   Status is: Inpatient  Remains inpatient appropriate because:IV treatments appropriate due to intensity of illness or inability to take PO  Dispo: The patient is from: Home              Anticipated d/c is to: Home              Patient currently is not medically stable to d/c.   Difficult to place patient No  Consultants:  None  Procedures: See below for x-ray reports.  Antimicrobials:  Doxycycline   Subjective: Still having difficulty speaking in full sentences requiring intermittent use of BiPAP.  No chest pain, no nausea, no vomiting, no fever  Objective: Vitals:   01/19/21 0815 01/19/21 0818 01/19/21 0823 01/19/21 0900  BP:     111/80  Pulse:    (!) 110  Resp:    (!) 27  Temp:    97.7 F (36.5 C)  TempSrc:    Oral  SpO2: 99% 99% 99% 96%  Weight:      Height:        Intake/Output Summary (Last 24 hours) at 01/19/2021 0951 Last data filed at 01/19/2021 0634 Gross per 24 hour  Intake 324.03 ml  Output --  Net 324.03 ml   Filed Weights   01/18/21 1748 01/18/21 2327  Weight: 54.4 kg 52.9 kg    Examination:  General exam: Overall improved air movement appreciated on examination; patient with difficulty speaking in full sentences and requiring intermittent use of BiPAP.  Mentation back to baseline.  No fever Respiratory system: Poor air movement bilaterally; positive expiratory wheezing.  Positive tachypnea. Cardiovascular system: S1 & S2 heard, RRR. No JVD, murmurs, rubs, gallops or clicks. No pedal edema. Gastrointestinal system: Abdomen is nondistended, soft and nontender. No organomegaly or masses felt. Normal bowel sounds heard. Central nervous system: Alert and oriented. No focal neurological deficits. Extremities: No cyanosis or clubbing. Skin: No petechiae. Psychiatry: Judgement and insight appear normal. Mood & affect appropriate.     Data Reviewed: I have personally reviewed following labs and imaging studies  CBC: Recent Labs  Lab 01/18/21 1759 01/19/21 0509  WBC 12.1* 8.7  NEUTROABS 10.6*  --   HGB 16.2 15.5  HCT 49.5 46.9  MCV 115.4* 115.8*  PLT 411* [id number]    Basic Metabolic Panel: Recent Labs  Lab 01/18/21 1759 01/19/21 0509  NA 137 137  K 4.2 4.4  CL 97* 98  CO2 32 29  GLUCOSE 118* 115*  BUN 18 24*  CREATININE 0.95 1.15  CALCIUM 8.6* 8.2*    GFR: Estimated Creatinine Clearance: 44.1 mL/min (by C-G formula based on SCr of 1.15 mg/dL).  Liver Function Tests: Recent Labs  Lab 01/18/21 1759 01/19/21 0509  AST 21 16  ALT 31 26  ALKPHOS 71 62  BILITOT 0.4 0.5  PROT 6.4* 5.6*  ALBUMIN 3.5 3.0*    CBG: No results for input(s): GLUCAP in the last 168  hours.   Recent Results (from the past 240 hour(s))  Resp Panel by RT-PCR (Flu A&B, Covid) Nasopharyngeal Swab     Status: None   Collection Time: 01/18/21  5:54 PM   Specimen: Nasopharyngeal Swab; Nasopharyngeal(NP) swabs in vial transport medium  Result Value Ref Range Status   SARS Coronavirus 2 by RT PCR NEGATIVE NEGATIVE Final    Comment: (NOTE) SARS-CoV-2 target nucleic acids are NOT DETECTED.  The SARS-CoV-2 RNA is generally detectable in upper respiratory specimens during the acute phase of infection. The lowest concentration of SARS-CoV-2 viral copies this assay can detect is 138 copies/mL. A negative result does not preclude SARS-Cov-2 infection and should not be used as the sole basis for treatment or other patient management decisions. A negative result may occur with  improper specimen collection/handling, submission of specimen other than nasopharyngeal swab, presence of viral mutation(s) within the areas targeted by this assay, and inadequate number of viral copies(<138 copies/mL). A negative result must be combined with clinical  observations, patient history, and epidemiological information. The expected result is Negative.  Fact Sheet for Patients:  EntrepreneurPulse.com.au  Fact Sheet for Healthcare Providers:  IncredibleEmployment.be  This test is no t yet approved or cleared by the [place] FDA and  has been authorized for detection and/or diagnosis of SARS-CoV-2 by FDA under an Emergency Use Authorization (EUA). This EUA will remain  in effect (meaning this test can be used) for the duration of the COVID-19 declaration under Section 564(b)(1) of the Act, 21 U.S.C.section 360bbb-3(b)(1), unless the authorization is terminated  or revoked sooner.       Influenza A by PCR NEGATIVE NEGATIVE Final   Influenza B by PCR NEGATIVE NEGATIVE Final    Comment: (NOTE) The Xpert Xpress SARS-CoV-2/FLU/RSV plus assay is intended  as an aid in the diagnosis of influenza from Nasopharyngeal swab specimens and should not be used as a sole basis for treatment. Nasal washings and aspirates are unacceptable for Xpert Xpress SARS-CoV-2/FLU/RSV testing.  Fact Sheet for Patients: EntrepreneurPulse.com.au  Fact Sheet for Healthcare Providers: IncredibleEmployment.be  This test is not yet approved or cleared by the [place] FDA and has been authorized for detection and/or diagnosis of SARS-CoV-2 by FDA under an Emergency Use Authorization (EUA). This EUA will remain in effect (meaning this test can be used) for the duration of the COVID-19 declaration under Section 564(b)(1) of the Act, 21 U.S.C. section 360bbb-3(b)(1), unless the authorization is terminated or revoked.  Performed at [facility], [address], [place], [place] [address]   MRSA Next Gen by PCR, Nasal     Status: None   Collection Time: 01/18/21 11:12 PM   Specimen: Nasal Mucosa; Nasal Swab  Result Value Ref Range Status   MRSA by PCR Next Gen NOT DETECTED NOT DETECTED Final    Comment: (NOTE) The GeneXpert MRSA Assay (FDA approved for NASAL specimens only), is one component of a comprehensive MRSA colonization surveillance program. It is not intended to diagnose MRSA infection nor to guide or monitor treatment for MRSA infections. Test performance is not FDA approved in patients less than 79 years old. Performed at [facility], [address], [place], [place] [address]      Radiology Studies: [facility] Chest [facility] 1 View  Result Date: 01/18/2021 CLINICAL DATA:  Shortness of breath. Decreased level consciousness. Hypoxia. COPD. Coronary artery disease. EXAM: PORTABLE CHEST 1 VIEW COMPARISON:  09/18/2020 FINDINGS: The heart size and mediastinal contours are within normal limits. Prior CABG again noted. Pulmonary hyperinflation and diffuse interstitial prominence are stable, consistent with COPD. No evidence of  acute pulmonary infiltrate or pleural effusion. IMPRESSION: COPD. No active disease. Electronically Signed   By: Marlaine Hind M.D.   On: 01/18/2021 18:40    Scheduled Meds:  arformoterol  15 mcg Nebulization BID   And   umeclidinium bromide  1 puff Inhalation Daily   aspirin EC  81 mg Oral QHS   atorvastatin  80 mg Oral QHS   carvedilol  6.25 mg Oral BID WC   Chlorhexidine Gluconate Cloth  6 each Topical Daily   heparin  5,000 Units Subcutaneous Q8H   hydroxyurea  500 mg Oral Q breakfast   ipratropium-albuterol  3 mL Nebulization Q6H   lisinopril  20 mg Oral Daily   methylPREDNISolone (SOLU-MEDROL) injection  80 mg Intravenous Q12H   nicotine  21 mg Transdermal Daily   pantoprazole  40 mg Oral BID   sucralfate  1 g Oral TID WC & HS   Continuous Infusions:  sodium chloride 100 mL/hr at 01/19/21 [id number]   methocarbamol (ROBAXIN) IV       LOS: 1 day    Time spent: 35 minutes    Barton Dubois, MD [facility]   To contact the attending provider between 7A-7P or the covering provider during after hours 7P-7A, please log into the web site [email or web address] and access using universal [place] password for that web site. If you do not have the password, please call the hospital operator.  01/19/2021, 9:51 AM

## 2021-01-19 NOTE — Progress Notes (Signed)
Patient is currently on 4L [place] with sat of 98%.  HR is at 89, Rate at 22.  No distress noted at this time, Bipap not needed.

## 2021-01-19 NOTE — Progress Notes (Signed)
Removed patient from Bipap and placed on 2lpm [place]. Patient started having shortness of breath after removal. I increased the O2 to 3.5lpm and taught pursed lip breathing technique to patient. Initiall SPO2 was dropping and RR increased. His SPO2 is now 95% and RR in mid 20s. Patient has been notified to call by using the call bell if SOB gets worse. Patient is stable and attempting to eat at this time.

## 2021-01-20 DIAGNOSIS — F17200 Nicotine dependence, unspecified, uncomplicated: Secondary | ICD-10-CM | POA: Diagnosis not present

## 2021-01-20 DIAGNOSIS — J441 Chronic obstructive pulmonary disease with (acute) exacerbation: Secondary | ICD-10-CM | POA: Diagnosis not present

## 2021-01-20 DIAGNOSIS — J9601 Acute respiratory failure with hypoxia: Secondary | ICD-10-CM | POA: Diagnosis not present

## 2021-01-20 DIAGNOSIS — G9341 Metabolic encephalopathy: Secondary | ICD-10-CM | POA: Diagnosis not present

## 2021-01-20 LAB — BASIC METABOLIC PANEL
Anion gap: 7 (ref 5–15)
BUN: 27 mg/dL — ABNORMAL HIGH (ref 8–23)
CO2: 33 mmol/L — ABNORMAL HIGH (ref 22–32)
Calcium: 8.5 mg/dL — ABNORMAL LOW (ref 8.9–10.3)
Chloride: 98 mmol/L (ref 98–111)
Creatinine, Ser: 0.97 mg/dL (ref 0.61–1.24)
GFR, Estimated: >60 mL/min (ref >60)
Glucose, Bld: 131 mg/dL — ABNORMAL HIGH (ref 70–99)
Potassium: 4.7 mmol/L (ref 3.5–5.1)
Sodium: 138 mmol/L (ref 135–145)

## 2021-01-20 LAB — CBC
HCT: 42.1 % (ref 39.0–52.0)
Hemoglobin: 13.6 g/dL (ref 13.0–17.0)
MCH: 38.4 pg — ABNORMAL HIGH (ref 26.0–34.0)
MCHC: 32.3 g/dL (ref 30.0–36.0)
MCV: 118.9 fL — ABNORMAL HIGH (ref 80.0–100.0)
Platelets: 345 10*3/uL (ref 150–400)
RBC: 3.54 MIL/uL — ABNORMAL LOW (ref 4.22–5.81)
RDW: 14.3 % (ref 11.5–15.5)
WBC: 17 10*3/uL — ABNORMAL HIGH (ref 4.0–10.5)
nRBC: 0 % (ref 0.0–0.2)

## 2021-01-20 MED ORDER — ALPRAZOLAM 0.5 MG PO TABS
0.5000 mg | ORAL_TABLET | Freq: Two times a day (BID) | ORAL | Status: DC | PRN
  Administered 2021-01-20 – 2021-01-22 (×3): 0.5 mg via ORAL
  Filled 2021-01-20 (×3): qty 1

## 2021-01-20 NOTE — Progress Notes (Signed)
Acapella provided. Educated and demonstration completed.

## 2021-01-20 NOTE — Plan of Care (Signed)
Care plan reviewed.

## 2021-01-20 NOTE — Progress Notes (Signed)
PROGRESS NOTE    Jonathan Rosales  [id number] DOB: [birth year] DOA: 01/18/2021 PCP: Claretta Fraise, MD   Chief Complaint  Patient presents with   Shortness of Breath    Brief admission Narrative:  As per H&P written by Dr. Clearence Ped on 01/18/21 Jonathan Rosales  is a 70 y.o. male, with current tobacco use, COPD, anxiety, CKD, coronary artery disease, hypertension, and more presents the ED with a chief complaint of dyspnea and altered mental status.  Patient is quite fatigued and on the BiPAP.  He awakens to voice, and tries to answer questions but is still altered.  He reports he is not sure why he is here.  He is oriented x3.  He reports he is not in any pain.  Chart review reveals that EMS had been called out to the house for shortness of breath.  At that time they put him on a couple liters of oxygen, he started feeling better and he decided not to be transported to the hospital.  He was then noted to have decreased level of consciousness per family, so EMS was called again.  When they arrived the second time his oxygen saturations were in the 70s.  They put patient on nonrebreather and he improved to 100%.  Upon arriving to the ED he was able to be weaned down to 4 L, but was still very somnolent.  VBG was done that showed CO2 retention so patient was started on BiPAP.  Patient was given steroids and breathing treatments.  Chest x-ray showed no acute disease but did show COPD/emphysema.  EKG was similar to previous with more motion artifact, and tachycardia.  Patient had a slight leukocytosis at 12.1, hemoglobin stable at 16.2.  Chemistry panel is unremarkable.  Negative COVID.  Admission was requested for further management of COPD exacerbation.   This history is limited by acuity of illness requiring BiPAP, altered mental status of the patient.     Assessment & Plan: 1-acute respiratory failure with hypoxia/hypercapnia in the setting of COPD exacerbation and bronchiectasis -Patient  requiring BiPAP due to increased work of breathing and CO2 retention. -Overall improved and now working to keep patient Off BIPAP. -PRN xanax will be added to assist with tachypnea and anxiety symptoms. -Continue IV steroids, scheduled duo nebs, yulperi nebulizer and the use of Brovana and Pulmicort. -Patient will need outpatient follow-up with pulmonologist for repeat of PFTs and further adjustment to maintenance treatment. -Tobacco cessation counseling provided.   -continue BID PPI -Will continue treatment with doxycycline day 2/5.  2-Essential hypertension -Pulmonal -Continue current antihypertensive regimen.  3-Gastroesophageal reflux disease without esophagitis -Continue PPI.  4-Current smoker -Cessation counseling provided -Continue nicotine patch.  5-Acute metabolic encephalopathy -In the setting of respiratory failure with hypercapnia and hypoxia. -Improve mentation back to baseline currently. -Continue supportive care and treatment for COPD exacerbation as mentioned above.  6-history of coronary artery disease -Continue lisinopril, Coreg, statin and aspirin -Patient denies chest pain.  DVT prophylaxis: SCDs Code Status: Full code Family Communication: No family at bedside; patient expressed that he will contact his wife for updates.  Disposition:   Status is: Inpatient  Remains inpatient appropriate because:IV treatments appropriate due to intensity of illness or inability to take PO  Dispo: The patient is from: Home              Anticipated d/c is to: Home              Patient currently is not medically stable to d/c.  Difficult to place patient No       Consultants:  None  Procedures: See below for x-ray reports.  Antimicrobials:  Doxycycline   Subjective: Currently approximately 12 hours off BiPAP; using 3.5 L nasal cannula supplementation to maintain O2 sat.  Experiencing difficulty speaking in full sentences, positive tachypnea with minimal  exertion and diffuse bilateral expiratory wheezing.  Objective: Vitals:   01/20/21 1100 01/20/21 1130 01/20/21 1200 01/20/21 1707  BP: (!) 150/83  (!) 155/117   Pulse: 91  92   Resp: (!) 26  (!) 27   Temp:  97.7 F (36.5 C)  (!) 97.1 F (36.2 C)  TempSrc:  Oral  Axillary  SpO2: 95%  94%   Weight:      Height:        Intake/Output Summary (Last 24 hours) at 01/20/2021 1819 Last data filed at 01/20/2021 1200 Gross per 24 hour  Intake 1202.07 ml  Output 1000 ml  Net 202.07 ml   Filed Weights   01/18/21 1748 01/18/21 2327 01/20/21 0400  Weight: 54.4 kg 52.9 kg 56.2 kg    Examination: General exam: Afebrile, no chest pain, no nausea, no vomiting.  Demonstrating difficulty speaking in full sentences and with intermittent episode of tachypnea.  Requiring 3.5 L nasal cannula supplementation to maintain O2 sats currently.  Off BiPAP for >12 hours now Respiratory system: Positive expiratory wheezing appreciated bilaterally; poor air movement and positive tachypnea. Cardiovascular system:RRR. No murmurs, rubs, gallops. Gastrointestinal system: Abdomen is nondistended, soft and nontender. No organomegaly or masses felt. Normal bowel sounds heard. Central nervous system: Alert and oriented. No focal neurological deficits. Extremities: No cyanosis or clubbing. Skin: No petechiae. Psychiatry: Judgement and insight appear normal. Mood & affect appropriate.   Data Reviewed: I have personally reviewed following labs and imaging studies  CBC: Recent Labs  Lab 01/18/21 1759 01/19/21 0509 01/20/21 0427  WBC 12.1* 8.7 17.0*  NEUTROABS 10.6*  --   --   HGB 16.2 15.5 13.6  HCT 49.5 46.9 42.1  MCV 115.4* 115.8* 118.9*  PLT 411* 364 [id number]    Basic Metabolic Panel: Recent Labs  Lab 01/18/21 1759 01/19/21 0509 01/20/21 0427  NA 137 137 138  K 4.2 4.4 4.7  CL 97* 98 98  CO2 32 29 33*  GLUCOSE 118* 115* 131*  BUN 18 24* 27*  CREATININE 0.95 1.15 0.97  CALCIUM 8.6* 8.2* 8.5*     GFR: Estimated Creatinine Clearance: 55.5 mL/min (by C-G formula based on SCr of 0.97 mg/dL).  Liver Function Tests: Recent Labs  Lab 01/18/21 1759 01/19/21 0509  AST 21 16  ALT 31 26  ALKPHOS 71 62  BILITOT 0.4 0.5  PROT 6.4* 5.6*  ALBUMIN 3.5 3.0*    CBG: Recent Labs  Lab 01/19/21 1635  GLUCAP 163*     Recent Results (from the past 240 hour(s))  Resp Panel by RT-PCR (Flu A&B, Covid) Nasopharyngeal Swab     Status: None   Collection Time: 01/18/21  5:54 PM   Specimen: Nasopharyngeal Swab; Nasopharyngeal(NP) swabs in vial transport medium  Result Value Ref Range Status   SARS Coronavirus 2 by RT PCR NEGATIVE NEGATIVE Final    Comment: (NOTE) SARS-CoV-2 target nucleic acids are NOT DETECTED.  The SARS-CoV-2 RNA is generally detectable in upper respiratory specimens during the acute phase of infection. The lowest concentration of SARS-CoV-2 viral copies this assay can detect is 138 copies/mL. A negative result does not preclude SARS-Cov-2 infection and should not be used as  the sole basis for treatment or other patient management decisions. A negative result may occur with  improper specimen collection/handling, submission of specimen other than nasopharyngeal swab, presence of viral mutation(s) within the areas targeted by this assay, and inadequate number of viral copies(<138 copies/mL). A negative result must be combined with clinical observations, patient history, and epidemiological information. The expected result is Negative.  Fact Sheet for Patients:  EntrepreneurPulse.com.au  Fact Sheet for Healthcare Providers:  IncredibleEmployment.be  This test is no t yet approved or cleared by the [place] FDA and  has been authorized for detection and/or diagnosis of SARS-CoV-2 by FDA under an Emergency Use Authorization (EUA). This EUA will remain  in effect (meaning this test can be used) for the duration of  the COVID-19 declaration under Section 564(b)(1) of the Act, 21 U.S.C.section 360bbb-3(b)(1), unless the authorization is terminated  or revoked sooner.       Influenza A by PCR NEGATIVE NEGATIVE Final   Influenza B by PCR NEGATIVE NEGATIVE Final    Comment: (NOTE) The Xpert Xpress SARS-CoV-2/FLU/RSV plus assay is intended as an aid in the diagnosis of influenza from Nasopharyngeal swab specimens and should not be used as a sole basis for treatment. Nasal washings and aspirates are unacceptable for Xpert Xpress SARS-CoV-2/FLU/RSV testing.  Fact Sheet for Patients: EntrepreneurPulse.com.au  Fact Sheet for Healthcare Providers: IncredibleEmployment.be  This test is not yet approved or cleared by the [place] FDA and has been authorized for detection and/or diagnosis of SARS-CoV-2 by FDA under an Emergency Use Authorization (EUA). This EUA will remain in effect (meaning this test can be used) for the duration of the COVID-19 declaration under Section 564(b)(1) of the Act, 21 U.S.C. section 360bbb-3(b)(1), unless the authorization is terminated or revoked.  Performed at [facility], [address] [place], [place] [address]   MRSA Next Gen by PCR, Nasal     Status: None   Collection Time: 01/18/21 11:12 PM   Specimen: Nasal Mucosa; Nasal Swab  Result Value Ref Range Status   MRSA by PCR Next Gen NOT DETECTED NOT DETECTED Final    Comment: (NOTE) The GeneXpert MRSA Assay (FDA approved for NASAL specimens only), is one component of a comprehensive MRSA colonization surveillance program. It is not intended to diagnose MRSA infection nor to guide or monitor treatment for MRSA infections. Test performance is not FDA approved in patients less than 57 years old. Performed at [facility], [address], [place], [place] [address]      Radiology Studies: No results found.  Scheduled Meds:  arformoterol  15 mcg Nebulization BID   aspirin  EC  81 mg Oral QHS   atorvastatin  80 mg Oral QHS   budesonide (PULMICORT) nebulizer solution  0.5 mg Nebulization BID   carvedilol  6.25 mg Oral BID WC   Chlorhexidine Gluconate Cloth  6 each Topical Daily   doxycycline  100 mg Oral Q12H   heparin  5,000 Units Subcutaneous Q8H   hydroxyurea  500 mg Oral Q breakfast   lisinopril  20 mg Oral Daily   methylPREDNISolone (SOLU-MEDROL) injection  80 mg Intravenous Q12H   nicotine  21 mg Transdermal Daily   pantoprazole  40 mg Oral BID   revefenacin  175 mcg Nebulization Daily   sucralfate  1 g Oral TID WC & HS   Continuous Infusions:  sodium chloride 100 mL/hr at 01/20/21 1300   methocarbamol (ROBAXIN) IV       LOS: 2 days  Time spent: 35 minutes    Barton Dubois, MD [facility]   To contact the attending provider between 7A-7P or the covering provider during after hours 7P-7A, please log into the web site [email or web address] and access using universal [place] password for that web site. If you do not have the password, please call the hospital operator.  01/20/2021, 6:19 PM   Who

## 2021-01-21 DIAGNOSIS — F17200 Nicotine dependence, unspecified, uncomplicated: Secondary | ICD-10-CM | POA: Diagnosis not present

## 2021-01-21 DIAGNOSIS — J9601 Acute respiratory failure with hypoxia: Secondary | ICD-10-CM | POA: Diagnosis not present

## 2021-01-21 DIAGNOSIS — J441 Chronic obstructive pulmonary disease with (acute) exacerbation: Secondary | ICD-10-CM | POA: Diagnosis not present

## 2021-01-21 DIAGNOSIS — G9341 Metabolic encephalopathy: Secondary | ICD-10-CM | POA: Diagnosis not present

## 2021-01-21 MED ORDER — SIMETHICONE 80 MG PO CHEW
80.0000 mg | CHEWABLE_TABLET | Freq: Once | ORAL | Status: AC
  Administered 2021-01-21: 80 mg via ORAL
  Filled 2021-01-21: qty 1

## 2021-01-21 NOTE — Progress Notes (Signed)
PROGRESS NOTE    Jonathan Rosales  [id number] DOB: 01-Dec-1950 DOA: 01/18/2021 PCP: Claretta Fraise, MD   Chief Complaint  Patient presents with   Shortness of Breath    Brief admission Narrative:  As per H&P written by Dr. Clearence Ped on 01/18/21 Jonathan Rosales  is a 70 y.o. male, with current tobacco use, COPD, anxiety, CKD, coronary artery disease, hypertension, and more presents the ED with a chief complaint of dyspnea and altered mental status.  Patient is quite fatigued and on the BiPAP.  He awakens to voice, and tries to answer questions but is still altered.  He reports he is not sure why he is here.  He is oriented x3.  He reports he is not in any pain.  Chart review reveals that EMS had been called out to the house for shortness of breath.  At that time they put him on a couple liters of oxygen, he started feeling better and he decided not to be transported to the hospital.  He was then noted to have decreased level of consciousness per family, so EMS was called again.  When they arrived the second time his oxygen saturations were in the 70s.  They put patient on nonrebreather and he improved to 100%.  Upon arriving to the ED he was able to be weaned down to 4 L, but was still very somnolent.  VBG was done that showed CO2 retention so patient was started on BiPAP.  Patient was given steroids and breathing treatments.  Chest x-ray showed no acute disease but did show COPD/emphysema.  EKG was similar to previous with more motion artifact, and tachycardia.  Patient had a slight leukocytosis at 12.1, hemoglobin stable at 16.2.  Chemistry panel is unremarkable.  Negative COVID.  Admission was requested for further management of COPD exacerbation.   This history is limited by acuity of illness requiring BiPAP, altered mental status of the patient.    Assessment & Plan: 1-acute respiratory failure with hypoxia/hypercapnia in the setting of COPD exacerbation and bronchiectasis -Patient  requiring BiPAP due to increased work of breathing and CO2 retention. -Overall improved and now working to keep patient Off BIPAP. -PRN xanax will be added to assist with tachypnea and anxiety symptoms. -Continue IV steroids, scheduled duo nebs, yulperi nebulizer and the use of Brovana and Pulmicort. -Patient will need outpatient follow-up with pulmonologist for repeat of PFTs and further adjustment to maintenance treatment. -Tobacco cessation counseling provided.   -continue BID PPI -Will continue treatment with doxycycline day 2/5.  2-Essential hypertension -Pulmonal -Continue current antihypertensive regimen.  3-Gastroesophageal reflux disease without esophagitis -Continue PPI.  4-Current smoker -Cessation counseling provided -Continue nicotine patch.  5-Acute metabolic encephalopathy -In the setting of respiratory failure with hypercapnia and hypoxia. -Improve mentation back to baseline currently. -Continue supportive care and treatment for COPD exacerbation as mentioned above.  6-history of coronary artery disease -Continue lisinopril, Coreg, statin and aspirin -Patient denies chest pain.  DVT prophylaxis: SCDs Code Status: Full code Family Communication: No family at bedside; patient expressed that he will contact his wife for updates.  Disposition:   Status is: Inpatient  Remains inpatient appropriate because:IV treatments appropriate due to intensity of illness or inability to take PO  Dispo: The patient is from: Home              Anticipated d/c is to: Home              Patient currently is not medically stable to d/c.  Difficult to place patient No       Consultants:  None  Procedures: See below for x-ray reports.  Antimicrobials:  Doxycycline   Subjective: Required the use of BiPAP overnight; continues to demonstrate difficulty speaking in full sentences, diffuse expiratory wheezing and bilateral rhonchi.  3-4 L nasal cannula supplementation to  maintain O2 sats above 90% at this time.  No fever.  Objective: Vitals:   01/21/21 1100 01/21/21 1200 01/21/21 1300 01/21/21 1400  BP: (!) 149/88 115/79 123/71 139/79  Pulse: 81 85 82 81  Resp: (!) 25 (!) 24 (!) 27 (!) 31  Temp:      TempSrc:      SpO2: 97% 96% 96% 94%  Weight:      Height:        Intake/Output Summary (Last 24 hours) at 01/21/2021 1710 Last data filed at 01/21/2021 1400 Gross per 24 hour  Intake 3539.21 ml  Output 350 ml  Net 3189.21 ml   Filed Weights   01/18/21 2327 01/20/21 0400 01/21/21 0431  Weight: 52.9 kg 56.2 kg 59.6 kg    Examination: General exam: No chest pain, no nausea, no vomiting.  Patient ended requiring the use of BiPAP overnight; still with difficulty speaking in full sentences and using 3-4 L nasal cannula supplementation at this time.  No fever Respiratory system: Poor air movement bilaterally; diffuse expiratory wheezing along with bilateral rhonchi, positive tachypnea. Cardiovascular system: No rubs, no gallops, no JVD.  RRR. Gastrointestinal system: Abdomen is nondistended, soft and nontender. No organomegaly or masses felt. Normal bowel sounds heard. Central nervous system: Alert and oriented. No focal neurological deficits. Extremities: No cyanosis or clubbing. Skin: No petechiae. Psychiatry: Judgement and insight appear normal. Mood & affect appropriate.    Data Reviewed: I have personally reviewed following labs and imaging studies  CBC: Recent Labs  Lab 01/18/21 1759 01/19/21 0509 01/20/21 0427  WBC 12.1* 8.7 17.0*  NEUTROABS 10.6*  --   --   HGB 16.2 15.5 13.6  HCT 49.5 46.9 42.1  MCV 115.4* 115.8* 118.9*  PLT 411* 364 123456    Basic Metabolic Panel: Recent Labs  Lab 01/18/21 1759 01/19/21 0509 01/20/21 0427  NA 137 137 138  K 4.2 4.4 4.7  CL 97* 98 98  CO2 32 29 33*  GLUCOSE 118* 115* 131*  BUN 18 24* 27*  CREATININE 0.95 1.15 0.97  CALCIUM 8.6* 8.2* 8.5*    GFR: Estimated Creatinine Clearance: 58.9  mL/min (by C-G formula based on SCr of 0.97 mg/dL).  Liver Function Tests: Recent Labs  Lab 01/18/21 1759 01/19/21 0509  AST 21 16  ALT 31 26  ALKPHOS 71 62  BILITOT 0.4 0.5  PROT 6.4* 5.6*  ALBUMIN 3.5 3.0*    CBG: Recent Labs  Lab 01/19/21 1635  GLUCAP 163*     Recent Results (from the past 240 hour(s))  Resp Panel by RT-PCR (Flu A&B, Covid) Nasopharyngeal Swab     Status: None   Collection Time: 01/18/21  5:54 PM   Specimen: Nasopharyngeal Swab; Nasopharyngeal(NP) swabs in vial transport medium  Result Value Ref Range Status   SARS Coronavirus 2 by RT PCR NEGATIVE NEGATIVE Final    Comment: (NOTE) SARS-CoV-2 target nucleic acids are NOT DETECTED.  The SARS-CoV-2 RNA is generally detectable in upper respiratory specimens during the acute phase of infection. The lowest concentration of SARS-CoV-2 viral copies this assay can detect is 138 copies/mL. A negative result does not preclude SARS-Cov-2 infection and should not be  used as the sole basis for treatment or other patient management decisions. A negative result may occur with  improper specimen collection/handling, submission of specimen other than nasopharyngeal swab, presence of viral mutation(s) within the areas targeted by this assay, and inadequate number of viral copies(<138 copies/mL). A negative result must be combined with clinical observations, patient history, and epidemiological information. The expected result is Negative.  Fact Sheet for Patients:  EntrepreneurPulse.com.au  Fact Sheet for Healthcare Providers:  IncredibleEmployment.be  This test is no t yet approved or cleared by the [place] FDA and  has been authorized for detection and/or diagnosis of SARS-CoV-2 by FDA under an Emergency Use Authorization (EUA). This EUA will remain  in effect (meaning this test can be used) for the duration of the COVID-19 declaration under Section 564(b)(1) of the  Act, 21 U.S.C.section 360bbb-3(b)(1), unless the authorization is terminated  or revoked sooner.       Influenza A by PCR NEGATIVE NEGATIVE Final   Influenza B by PCR NEGATIVE NEGATIVE Final    Comment: (NOTE) The Xpert Xpress SARS-CoV-2/FLU/RSV plus assay is intended as an aid in the diagnosis of influenza from Nasopharyngeal swab specimens and should not be used as a sole basis for treatment. Nasal washings and aspirates are unacceptable for Xpert Xpress SARS-CoV-2/FLU/RSV testing.  Fact Sheet for Patients: EntrepreneurPulse.com.au  Fact Sheet for Healthcare Providers: IncredibleEmployment.be  This test is not yet approved or cleared by the [place] FDA and has been authorized for detection and/or diagnosis of SARS-CoV-2 by FDA under an Emergency Use Authorization (EUA). This EUA will remain in effect (meaning this test can be used) for the duration of the COVID-19 declaration under Section 564(b)(1) of the Act, 21 U.S.C. section 360bbb-3(b)(1), unless the authorization is terminated or revoked.  Performed at [facility], [address] [place], [place] [address]   MRSA Next Gen by PCR, Nasal     Status: None   Collection Time: 01/18/21 11:12 PM   Specimen: Nasal Mucosa; Nasal Swab  Result Value Ref Range Status   MRSA by PCR Next Gen NOT DETECTED NOT DETECTED Final    Comment: (NOTE) The GeneXpert MRSA Assay (FDA approved for NASAL specimens only), is one component of a comprehensive MRSA colonization surveillance program. It is not intended to diagnose MRSA infection nor to guide or monitor treatment for MRSA infections. Test performance is not FDA approved in patients less than 31 years old. Performed at [facility], [address], [place], [place] [address]      Radiology Studies: No results found.  Scheduled Meds:  arformoterol  15 mcg Nebulization BID   aspirin EC  81 mg Oral QHS   atorvastatin  80 mg Oral QHS    budesonide (PULMICORT) nebulizer solution  0.5 mg Nebulization BID   carvedilol  6.25 mg Oral BID WC   Chlorhexidine Gluconate Cloth  6 each Topical Daily   doxycycline  100 mg Oral Q12H   heparin  5,000 Units Subcutaneous Q8H   hydroxyurea  500 mg Oral Q breakfast   lisinopril  20 mg Oral Daily   methylPREDNISolone (SOLU-MEDROL) injection  80 mg Intravenous Q12H   nicotine  21 mg Transdermal Daily   pantoprazole  40 mg Oral BID   revefenacin  175 mcg Nebulization Daily   sucralfate  1 g Oral TID WC & HS   Continuous Infusions:  sodium chloride 100 mL/hr at 01/21/21 0940   methocarbamol (ROBAXIN) IV       LOS: 3 days  Time spent: 35 minutes    Barton Dubois, MD [facility]   To contact the attending provider between 7A-7P or the covering provider during after hours 7P-7A, please log into the web site [email or web address] and access using universal [place] password for that web site. If you do not have the password, please call the hospital operator.  01/21/2021, 5:10 PM   Who

## 2021-01-22 DIAGNOSIS — F17200 Nicotine dependence, unspecified, uncomplicated: Secondary | ICD-10-CM | POA: Diagnosis not present

## 2021-01-22 DIAGNOSIS — J9601 Acute respiratory failure with hypoxia: Secondary | ICD-10-CM | POA: Diagnosis not present

## 2021-01-22 DIAGNOSIS — J441 Chronic obstructive pulmonary disease with (acute) exacerbation: Secondary | ICD-10-CM | POA: Diagnosis not present

## 2021-01-22 DIAGNOSIS — G9341 Metabolic encephalopathy: Secondary | ICD-10-CM | POA: Diagnosis not present

## 2021-01-22 LAB — GLUCOSE, CAPILLARY: Glucose-Capillary: 183 mg/dL — ABNORMAL HIGH (ref 70–99)

## 2021-01-22 MED ORDER — ALPRAZOLAM 0.5 MG PO TABS
0.5000 mg | ORAL_TABLET | Freq: Three times a day (TID) | ORAL | Status: DC | PRN
  Administered 2021-01-22 – 2021-01-24 (×6): 0.5 mg via ORAL
  Filled 2021-01-22 (×6): qty 1

## 2021-01-22 NOTE — Progress Notes (Signed)
Patient is currently on 4.5L with an O2 sat of 100%.  Bipap is still at beside if needed.  Will continue to monitor.

## 2021-01-22 NOTE — Progress Notes (Signed)
**Note De-Identified  Obfuscation** Patient removed from BIPAP and placed on 3 L [place], tolerating well. VS WNL, BBS cl/dim.  RRT to continue to monitor.

## 2021-01-22 NOTE — Progress Notes (Signed)
PROGRESS NOTE    Jonathan Rosales  QIO:[id number] DOB: 01-06-1951 DOA: 01/18/2021 PCP: Claretta Fraise, MD   Chief Complaint  Patient presents with   Shortness of Breath    Brief admission Narrative:  As per H&P written by Dr. Clearence Ped on 01/18/21 Jonathan Rosales  is a 70 y.o. male, with current tobacco use, COPD, anxiety, CKD, coronary artery disease, hypertension, and more presents the ED with a chief complaint of dyspnea and altered mental status.  Patient is quite fatigued and on the BiPAP.  He awakens to voice, and tries to answer questions but is still altered.  He reports he is not sure why he is here.  He is oriented x3.  He reports he is not in any pain.  Chart review reveals that EMS had been called out to the house for shortness of breath.  At that time they put him on a couple liters of oxygen, he started feeling better and he decided not to be transported to the hospital.  He was then noted to have decreased level of consciousness per family, so EMS was called again.  When they arrived the second time his oxygen saturations were in the 70s.  They put patient on nonrebreather and he improved to 100%.  Upon arriving to the ED he was able to be weaned down to 4 L, but was still very somnolent.  VBG was done that showed CO2 retention so patient was started on BiPAP.  Patient was given steroids and breathing treatments.  Chest x-ray showed no acute disease but did show COPD/emphysema.  EKG was similar to previous with more motion artifact, and tachycardia.  Patient had a slight leukocytosis at 12.1, hemoglobin stable at 16.2.  Chemistry panel is unremarkable.  Negative COVID.  Admission was requested for further management of COPD exacerbation.   This history is limited by acuity of illness requiring BiPAP, altered mental status of the patient.    Assessment & Plan: 1-acute respiratory failure with hypoxia/hypercapnia in the setting of COPD exacerbation and bronchiectasis -Patient  requiring BiPAP due to increased work of breathing and CO2 retention. -Slowly improving and now working to keep patient Off BIPAP. -Continue PRN xanax to assist with tachypnea and anxiety symptoms. -Continue IV steroids, scheduled duo nebs, yulperi nebulizer and the use of Brovana and Pulmicort. -Patient will need outpatient follow-up with pulmonologist for repeat of PFTs and further adjustment to maintenance treatment. -Tobacco cessation counseling provided.   -continue BID PPI -Will continue treatment with doxycycline day 3/5.  2-Essential hypertension -Pulmonal -Continue current antihypertensive regimen.  3-Gastroesophageal reflux disease without esophagitis -Continue PPI.  4-Current smoker -Cessation counseling provided -Continue nicotine patch.  5-Acute metabolic encephalopathy -In the setting of respiratory failure with hypercapnia and hypoxia. -Improve mentation back to baseline currently. -Continue supportive care and treatment for COPD exacerbation as mentioned above.  6-history of coronary artery disease -Continue lisinopril, Coreg, statin and aspirin -Patient denies chest pain.  DVT prophylaxis: SCDs Code Status: Full code Family Communication: No family at bedside; patient expressed that he will contact his wife for updates.  Disposition:   Status is: Inpatient  Remains inpatient appropriate because:IV treatments appropriate due to intensity of illness or inability to take PO  Dispo: The patient is from: Home              Anticipated d/c is to: Home              Patient currently is not medically stable to d/c.   Difficult to  place patient No       Consultants:  None  Procedures: See below for x-ray reports.  Antimicrobials:  Doxycycline   Subjective: Afebrile, no chest pain, no nausea, no vomiting.  3.5 L nasal cannula supplementation in place.  Patient ended requiring the use of BiPAP overnight.  Expressed shortness of breath with exertion and  still demonstrate difficulty speaking in full sentences.  Slowly improving.  Objective: Vitals:   01/22/21 1159 01/22/21 1200 01/22/21 1300 01/22/21 1625  BP: (!) 153/87 (!) 149/79 139/82   Pulse:  82 85 86  Resp:  (!) 25 (!) 35 (!) 33  Temp:    (!) 97.5 F (36.4 C)  TempSrc:    Oral  SpO2:  99% (!) 87% 95%  Weight:      Height:        Intake/Output Summary (Last 24 hours) at 01/22/2021 1659 Last data filed at 01/22/2021 0918 Gross per 24 hour  Intake 1200.75 ml  Output 2050 ml  Net -849.25 ml   Filed Weights   01/20/21 0400 01/21/21 0431 01/22/21 0429  Weight: 56.2 kg 59.6 kg 59.8 kg    Examination: General exam: Alert, awake, oriented x 3, no CP, no nausea, no vomiting. Reporting slight improvement in his breathing, but is still short of breath with exertion and having difficulty speaking in full sentences. Respiratory system: Fair air movement bilaterally; positive expiratory wheezing, no using accessory muscle.  Intermittent episode of tachypnea appreciated especially with exertion.  Positive rhonchi Cardiovascular system:RRR. No murmurs, rubs, gallops. Gastrointestinal system: Abdomen is nondistended, soft and nontender. No organomegaly or masses felt. Normal bowel sounds heard. Central nervous system: Alert and oriented. No focal neurological deficits. Extremities: No cyanosis or clubbing; no edema. Skin: No petechiae. Psychiatry: Judgement and insight appear normal. Mood & affect appropriate.    Data Reviewed: I have personally reviewed following labs and imaging studies  CBC: Recent Labs  Lab 01/18/21 1759 01/19/21 0509 01/20/21 0427  WBC 12.1* 8.7 17.0*  NEUTROABS 10.6*  --   --   HGB 16.2 15.5 13.6  HCT 49.5 46.9 42.1  MCV 115.4* 115.8* 118.9*  PLT 411* 364 314    Basic Metabolic Panel: Recent Labs  Lab 01/18/21 1759 01/19/21 0509 01/20/21 0427  NA 137 137 138  K 4.2 4.4 4.7  CL 97* 98 98  CO2 32 29 33*  GLUCOSE 118* 115* 131*  BUN 18 24* 27*   CREATININE 0.95 1.15 0.97  CALCIUM 8.6* 8.2* 8.5*    GFR: Estimated Creatinine Clearance: 59.1 mL/min (by C-G formula based on SCr of 0.97 mg/dL).  Liver Function Tests: Recent Labs  Lab 01/18/21 1759 01/19/21 0509  AST 21 16  ALT 31 26  ALKPHOS 71 62  BILITOT 0.4 0.5  PROT 6.4* 5.6*  ALBUMIN 3.5 3.0*    CBG: Recent Labs  Lab 01/19/21 1635  GLUCAP 163*     Recent Results (from the past 240 hour(s))  Resp Panel by RT-PCR (Flu A&B, Covid) Nasopharyngeal Swab     Status: None   Collection Time: 01/18/21  5:54 PM   Specimen: Nasopharyngeal Swab; Nasopharyngeal(NP) swabs in vial transport medium  Result Value Ref Range Status   SARS Coronavirus 2 by RT PCR NEGATIVE NEGATIVE Final    Comment: (NOTE) SARS-CoV-2 target nucleic acids are NOT DETECTED.  The SARS-CoV-2 RNA is generally detectable in upper respiratory specimens during the acute phase of infection. The lowest concentration of SARS-CoV-2 viral copies this assay can detect is 138 copies/mL.  A negative result does not preclude SARS-Cov-2 infection and should not be used as the sole basis for treatment or other patient management decisions. A negative result may occur with  improper specimen collection/handling, submission of specimen other than nasopharyngeal swab, presence of viral mutation(s) within the areas targeted by this assay, and inadequate number of viral copies(<138 copies/mL). A negative result must be combined with clinical observations, patient history, and epidemiological information. The expected result is Negative.  Fact Sheet for Patients:  EntrepreneurPulse.com.au  Fact Sheet for Healthcare Providers:  IncredibleEmployment.be  This test is no t yet approved or cleared by the [place] FDA and  has been authorized for detection and/or diagnosis of SARS-CoV-2 by FDA under an Emergency Use Authorization (EUA). This EUA will remain  in effect (meaning  this test can be used) for the duration of the COVID-19 declaration under Section 564(b)(1) of the Act, 21 U.S.C.section 360bbb-3(b)(1), unless the authorization is terminated  or revoked sooner.       Influenza A by PCR NEGATIVE NEGATIVE Final   Influenza B by PCR NEGATIVE NEGATIVE Final    Comment: (NOTE) The Xpert Xpress SARS-CoV-2/FLU/RSV plus assay is intended as an aid in the diagnosis of influenza from Nasopharyngeal swab specimens and should not be used as a sole basis for treatment. Nasal washings and aspirates are unacceptable for Xpert Xpress SARS-CoV-2/FLU/RSV testing.  Fact Sheet for Patients: EntrepreneurPulse.com.au  Fact Sheet for Healthcare Providers: IncredibleEmployment.be  This test is not yet approved or cleared by the [place] FDA and has been authorized for detection and/or diagnosis of SARS-CoV-2 by FDA under an Emergency Use Authorization (EUA). This EUA will remain in effect (meaning this test can be used) for the duration of the COVID-19 declaration under Section 564(b)(1) of the Act, 21 U.S.C. section 360bbb-3(b)(1), unless the authorization is terminated or revoked.  Performed at [facility], [address] [place], [place] [address]   MRSA Next Gen by PCR, Nasal     Status: None   Collection Time: 01/18/21 11:12 PM   Specimen: Nasal Mucosa; Nasal Swab  Result Value Ref Range Status   MRSA by PCR Next Gen NOT DETECTED NOT DETECTED Final    Comment: (NOTE) The GeneXpert MRSA Assay (FDA approved for NASAL specimens only), is one component of a comprehensive MRSA colonization surveillance program. It is not intended to diagnose MRSA infection nor to guide or monitor treatment for MRSA infections. Test performance is not FDA approved in patients less than 33 years old. Performed at [facility], [address], [place], [place] [address]      Radiology Studies: No results found.  Scheduled Meds:   arformoterol  15 mcg Nebulization BID   aspirin EC  81 mg Oral QHS   atorvastatin  80 mg Oral QHS   budesonide (PULMICORT) nebulizer solution  0.5 mg Nebulization BID   carvedilol  6.25 mg Oral BID WC   Chlorhexidine Gluconate Cloth  6 each Topical Daily   doxycycline  100 mg Oral Q12H   heparin  5,000 Units Subcutaneous Q8H   hydroxyurea  500 mg Oral Q breakfast   lisinopril  20 mg Oral Daily   methylPREDNISolone (SOLU-MEDROL) injection  80 mg Intravenous Q12H   nicotine  21 mg Transdermal Daily   pantoprazole  40 mg Oral BID   revefenacin  175 mcg Nebulization Daily   sucralfate  1 g Oral TID WC & HS   Continuous Infusions:  methocarbamol (ROBAXIN) IV  LOS: 4 days    Time spent: 35 minutes    Barton Dubois, MD [facility]   To contact the attending provider between 7A-7P or the covering provider during after hours 7P-7A, please log into the web site [email or web address] and access using universal [place] password for that web site. If you do not have the password, please call the hospital operator.  01/22/2021, 4:59 PM   Who

## 2021-01-22 NOTE — Progress Notes (Signed)
Patient requesting something for "anxiety" Prn xanax given at noon today. Next dose due at 8pm. Patient educated on medication being every 8 hours as needed. Therapeutic communication and presence provided

## 2021-01-23 ENCOUNTER — Telehealth: Payer: Self-pay | Admitting: *Deleted

## 2021-01-23 DIAGNOSIS — G9341 Metabolic encephalopathy: Secondary | ICD-10-CM | POA: Diagnosis not present

## 2021-01-23 DIAGNOSIS — J441 Chronic obstructive pulmonary disease with (acute) exacerbation: Secondary | ICD-10-CM | POA: Diagnosis not present

## 2021-01-23 DIAGNOSIS — J9601 Acute respiratory failure with hypoxia: Secondary | ICD-10-CM | POA: Diagnosis not present

## 2021-01-23 DIAGNOSIS — F17200 Nicotine dependence, unspecified, uncomplicated: Secondary | ICD-10-CM | POA: Diagnosis not present

## 2021-01-23 NOTE — Telephone Encounter (Signed)
STRESS TEST -     Stress test overall looks good. Some areas of old damage are noted, with perhaps some very small areas of current blockage that are small enough not to be of significant concern and similar to last stress test Continue current meds   J BrancH MD   ECHO -     Echo overall looks good. Heart has normal pumping function. Aortic valve has moderate leak which is not bad enough for concern but something to continue to monitor   Zandra Abts MD

## 2021-01-23 NOTE — Telephone Encounter (Signed)
Laurine Blazer, LPN  0/16/5800  6:34 PM EDT Back to Top    Wife (Diane) notified, copy to pcp.

## 2021-01-23 NOTE — Progress Notes (Signed)
PROGRESS NOTE    Jonathan Rosales  NIO:[id number] DOB: Jan 17, 1951 DOA: 01/18/2021 PCP: Claretta Fraise, MD   Chief Complaint  Patient presents with   Shortness of Breath    Brief admission Narrative:  As per H&P written by Dr. Clearence Ped on 01/18/21 Jonathan Rosales  is a 70 y.o. male, with current tobacco use, COPD, anxiety, CKD, coronary artery disease, hypertension, and more presents the ED with a chief complaint of dyspnea and altered mental status.  Patient is quite fatigued and on the BiPAP.  He awakens to voice, and tries to answer questions but is still altered.  He reports he is not sure why he is here.  He is oriented x3.  He reports he is not in any pain.  Chart review reveals that EMS had been called out to the house for shortness of breath.  At that time they put him on a couple liters of oxygen, he started feeling better and he decided not to be transported to the hospital.  He was then noted to have decreased level of consciousness per family, so EMS was called again.  When they arrived the second time his oxygen saturations were in the 70s.  They put patient on nonrebreather and he improved to 100%.  Upon arriving to the ED he was able to be weaned down to 4 L, but was still very somnolent.  VBG was done that showed CO2 retention so patient was started on BiPAP.  Patient was given steroids and breathing treatments.  Chest x-ray showed no acute disease but did show COPD/emphysema.  EKG was similar to previous with more motion artifact, and tachycardia.  Patient had a slight leukocytosis at 12.1, hemoglobin stable at 16.2.  Chemistry panel is unremarkable.  Negative COVID.  Admission was requested for further management of COPD exacerbation.   This history is limited by acuity of illness requiring BiPAP, altered mental status of the patient.    Assessment & Plan: 1-acute respiratory failure with hypoxia/hypercapnia in the setting of COPD exacerbation and bronchiectasis -Patient  requiring BiPAP due to increased work of breathing and CO2 retention. -Patient continued to improve slowly; continue current management.  Will transfer to telemetry.  As he has not required BiPAP in 24 hours. -Continue PRN xanax to assist with tachypnea and anxiety symptoms. -Continue IV steroids, scheduled duo nebs, yulperi nebulizer and the use of Brovana and Pulmicort. -Patient will need outpatient follow-up with pulmonologist for repeat of PFTs and further adjustment to maintenance treatment. -Tobacco cessation counseling provided.   -continue BID PPI -Will continue treatment with doxycycline day 4/5.  2-Essential hypertension -Pulmonal -Continue current antihypertensive regimen.  3-Gastroesophageal reflux disease without esophagitis -Continue PPI.  4-Current smoker -Cessation counseling provided -Continue nicotine patch.  5-Acute metabolic encephalopathy -In the setting of respiratory failure with hypercapnia and hypoxia. -Improve mentation back to baseline currently. -Continue supportive care and treatment for COPD exacerbation as mentioned above.  6-history of coronary artery disease -Continue lisinopril, Coreg, statin and aspirin -Patient denies chest pain.  DVT prophylaxis: SCDs Code Status: Full code Family Communication: No family at bedside; patient expressed that he will contact his wife for updates.  Disposition:   Status is: Inpatient  Remains inpatient appropriate because:IV treatments appropriate due to intensity of illness or inability to take PO  Dispo: The patient is from: Home              Anticipated d/c is to: Home  Patient currently is not medically stable to d/c.   Difficult to place patient No       Consultants:  None  Procedures: See below for x-ray reports.  Antimicrobials:  Doxycycline   Subjective: 24 hours without requiring the use of BiPAP; still tachypneic with exertion, demonstrating difficulty speaking in full  sentences and requiring 3-3.5 L nasal cannula to maintain O2 sat above 90%.  Objective: Vitals:   01/23/21 0700 01/23/21 0711 01/23/21 1148 01/23/21 1634  BP: 132/86     Pulse: 72 73 86 88  Resp: (!) 28 (!) 26 (!) 29 20  Temp:  (!) 97.4 F (36.3 C) 97.7 F (36.5 C) 97.6 F (36.4 C)  TempSrc:  Oral Oral Oral  SpO2: 93% 93% 94% 100%  Weight:      Height:        Intake/Output Summary (Last 24 hours) at 01/23/2021 1713 Last data filed at 01/23/2021 0444 Gross per 24 hour  Intake --  Output 1800 ml  Net -1800 ml   Filed Weights   01/21/21 0431 01/22/21 0429 01/23/21 0440  Weight: 59.6 kg 59.8 kg 57.5 kg    Examination: General exam: Alert, awake, oriented x 3, still having difficulty speaking in full sentences, requiring 3-3.5 L nasal cannula supplementation to maintain O2 sats and short winded with activity.  Patient has been now 24 hours without the need of BiPAP. Respiratory system: Positive rhonchi bilaterally; for movement.  Diffuse expiratory wheezing.  Tachypnea appreciated with minimal exertion. Cardiovascular system:RRR.  No rubs, no gallops, no JVD. Gastrointestinal system: Abdomen is nondistended, soft and nontender. No organomegaly or masses felt. Normal bowel sounds heard. Central nervous system: Alert and oriented. No focal neurological deficits. Extremities: No cyanosis or clubbing Skin: No rashes, no petechiae. Psychiatry: Judgement and insight appear normal. Mood & affect appropriate.    Data Reviewed: I have personally reviewed following labs and imaging studies  CBC: Recent Labs  Lab 01/18/21 1759 01/19/21 0509 01/20/21 0427  WBC 12.1* 8.7 17.0*  NEUTROABS 10.6*  --   --   HGB 16.2 15.5 13.6  HCT 49.5 46.9 42.1  MCV 115.4* 115.8* 118.9*  PLT 411* 364 161    Basic Metabolic Panel: Recent Labs  Lab 01/18/21 1759 01/19/21 0509 01/20/21 0427  NA 137 137 138  K 4.2 4.4 4.7  CL 97* 98 98  CO2 32 29 33*  GLUCOSE 118* 115* 131*  BUN 18 24* 27*   CREATININE 0.95 1.15 0.97  CALCIUM 8.6* 8.2* 8.5*    GFR: Estimated Creatinine Clearance: 56.8 mL/min (by C-G formula based on SCr of 0.97 mg/dL).  Liver Function Tests: Recent Labs  Lab 01/18/21 1759 01/19/21 0509  AST 21 16  ALT 31 26  ALKPHOS 71 62  BILITOT 0.4 0.5  PROT 6.4* 5.6*  ALBUMIN 3.5 3.0*    CBG: Recent Labs  Lab 01/19/21 1635 01/22/21 2111  GLUCAP 163* 183*     Recent Results (from the past 240 hour(s))  Resp Panel by RT-PCR (Flu A&B, Covid) Nasopharyngeal Swab     Status: None   Collection Time: 01/18/21  5:54 PM   Specimen: Nasopharyngeal Swab; Nasopharyngeal(NP) swabs in vial transport medium  Result Value Ref Range Status   SARS Coronavirus 2 by RT PCR NEGATIVE NEGATIVE Final    Comment: (NOTE) SARS-CoV-2 target nucleic acids are NOT DETECTED.  The SARS-CoV-2 RNA is generally detectable in upper respiratory specimens during the acute phase of infection. The lowest concentration of SARS-CoV-2 viral copies this  assay can detect is 138 copies/mL. A negative result does not preclude SARS-Cov-2 infection and should not be used as the sole basis for treatment or other patient management decisions. A negative result may occur with  improper specimen collection/handling, submission of specimen other than nasopharyngeal swab, presence of viral mutation(s) within the areas targeted by this assay, and inadequate number of viral copies(<138 copies/mL). A negative result must be combined with clinical observations, patient history, and epidemiological information. The expected result is Negative.  Fact Sheet for Patients:  EntrepreneurPulse.com.au  Fact Sheet for Healthcare Providers:  IncredibleEmployment.be  This test is no t yet approved or cleared by the [place] FDA and  has been authorized for detection and/or diagnosis of SARS-CoV-2 by FDA under an Emergency Use Authorization (EUA). This EUA will remain   in effect (meaning this test can be used) for the duration of the COVID-19 declaration under Section 564(b)(1) of the Act, 21 U.S.C.section 360bbb-3(b)(1), unless the authorization is terminated  or revoked sooner.       Influenza A by PCR NEGATIVE NEGATIVE Final   Influenza B by PCR NEGATIVE NEGATIVE Final    Comment: (NOTE) The Xpert Xpress SARS-CoV-2/FLU/RSV plus assay is intended as an aid in the diagnosis of influenza from Nasopharyngeal swab specimens and should not be used as a sole basis for treatment. Nasal washings and aspirates are unacceptable for Xpert Xpress SARS-CoV-2/FLU/RSV testing.  Fact Sheet for Patients: EntrepreneurPulse.com.au  Fact Sheet for Healthcare Providers: IncredibleEmployment.be  This test is not yet approved or cleared by the [place] FDA and has been authorized for detection and/or diagnosis of SARS-CoV-2 by FDA under an Emergency Use Authorization (EUA). This EUA will remain in effect (meaning this test can be used) for the duration of the COVID-19 declaration under Section 564(b)(1) of the Act, 21 U.S.C. section 360bbb-3(b)(1), unless the authorization is terminated or revoked.  Performed at [facility], [address] [place], [place] [address]   MRSA Next Gen by PCR, Nasal     Status: None   Collection Time: 01/18/21 11:12 PM   Specimen: Nasal Mucosa; Nasal Swab  Result Value Ref Range Status   MRSA by PCR Next Gen NOT DETECTED NOT DETECTED Final    Comment: (NOTE) The GeneXpert MRSA Assay (FDA approved for NASAL specimens only), is one component of a comprehensive MRSA colonization surveillance program. It is not intended to diagnose MRSA infection nor to guide or monitor treatment for MRSA infections. Test performance is not FDA approved in patients less than 60 years old. Performed at [facility], [address] [place],  [address]      Radiology Studies: No results  found.  Scheduled Meds:  arformoterol  15 mcg Nebulization BID   aspirin EC  81 mg Oral QHS   atorvastatin  80 mg Oral QHS   budesonide (PULMICORT) nebulizer solution  0.5 mg Nebulization BID   carvedilol  6.25 mg Oral BID WC   Chlorhexidine Gluconate Cloth  6 each Topical Daily   doxycycline  100 mg Oral Q12H   heparin  5,000 Units Subcutaneous Q8H   hydroxyurea  500 mg Oral Q breakfast   lisinopril  20 mg Oral Daily   methylPREDNISolone (SOLU-MEDROL) injection  80 mg Intravenous Q12H   nicotine  21 mg Transdermal Daily   pantoprazole  40 mg Oral BID   revefenacin  175 mcg Nebulization Daily   sucralfate  1 g Oral TID WC & HS   Continuous Infusions:  methocarbamol (ROBAXIN) IV  LOS: 5 days    Time spent: 35 minutes    Barton Dubois, MD [facility]   To contact the attending provider between 7A-7P or the covering provider during after hours 7P-7A, please log into the web site [email or web address] and access using universal [place] password for that web site. If you do not have the password, please call the hospital operator.  01/23/2021, 5:13 PM   Who

## 2021-01-24 ENCOUNTER — Encounter (HOSPITAL_COMMUNITY): Payer: Self-pay | Admitting: Family Medicine

## 2021-01-24 ENCOUNTER — Inpatient Hospital Stay (HOSPITAL_COMMUNITY): Payer: Medicare Other

## 2021-01-24 DIAGNOSIS — J9601 Acute respiratory failure with hypoxia: Secondary | ICD-10-CM

## 2021-01-24 DIAGNOSIS — Z515 Encounter for palliative care: Secondary | ICD-10-CM | POA: Diagnosis not present

## 2021-01-24 DIAGNOSIS — Z7189 Other specified counseling: Secondary | ICD-10-CM | POA: Diagnosis not present

## 2021-01-24 DIAGNOSIS — J441 Chronic obstructive pulmonary disease with (acute) exacerbation: Secondary | ICD-10-CM

## 2021-01-24 DIAGNOSIS — I10 Essential (primary) hypertension: Secondary | ICD-10-CM | POA: Diagnosis not present

## 2021-01-24 DIAGNOSIS — J9602 Acute respiratory failure with hypercapnia: Secondary | ICD-10-CM | POA: Diagnosis not present

## 2021-01-24 DIAGNOSIS — J9621 Acute and chronic respiratory failure with hypoxia: Secondary | ICD-10-CM | POA: Diagnosis not present

## 2021-01-24 DIAGNOSIS — J9622 Acute and chronic respiratory failure with hypercapnia: Secondary | ICD-10-CM | POA: Diagnosis not present

## 2021-01-24 DIAGNOSIS — F17200 Nicotine dependence, unspecified, uncomplicated: Secondary | ICD-10-CM

## 2021-01-24 DIAGNOSIS — G9341 Metabolic encephalopathy: Secondary | ICD-10-CM | POA: Diagnosis not present

## 2021-01-24 LAB — BRAIN NATRIURETIC PEPTIDE: B Natriuretic Peptide: 416 pg/mL — ABNORMAL HIGH (ref 0.0–100.0)

## 2021-01-24 MED ORDER — FUROSEMIDE 10 MG/ML IJ SOLN
40.0000 mg | Freq: Once | INTRAMUSCULAR | Status: AC
  Administered 2021-01-24: 40 mg via INTRAVENOUS
  Filled 2021-01-24: qty 4

## 2021-01-24 MED ORDER — MORPHINE SULFATE (PF) 2 MG/ML IV SOLN
1.0000 mg | INTRAVENOUS | Status: DC | PRN
  Administered 2021-01-25 – 2021-01-26 (×2): 2 mg via INTRAVENOUS
  Administered 2021-01-28: 1 mg via INTRAVENOUS
  Filled 2021-01-24 (×3): qty 1

## 2021-01-24 MED ORDER — CLONIDINE HCL 0.1 MG PO TABS
0.1000 mg | ORAL_TABLET | Freq: Two times a day (BID) | ORAL | Status: DC
  Administered 2021-01-24 – 2021-01-25 (×2): 0.1 mg via ORAL
  Filled 2021-01-24 (×2): qty 1

## 2021-01-24 MED ORDER — PANTOPRAZOLE SODIUM 40 MG PO TBEC
40.0000 mg | DELAYED_RELEASE_TABLET | Freq: Two times a day (BID) | ORAL | Status: DC
  Administered 2021-01-24 – 2021-01-25 (×2): 40 mg via ORAL
  Filled 2021-01-24 (×2): qty 1

## 2021-01-24 MED ORDER — MORPHINE SULFATE (PF) 2 MG/ML IV SOLN
1.0000 mg | INTRAVENOUS | Status: DC
Start: 2021-01-24 — End: 2021-01-25
  Administered 2021-01-24 – 2021-01-25 (×5): 1 mg via INTRAVENOUS
  Filled 2021-01-24 (×5): qty 1

## 2021-01-24 NOTE — Progress Notes (Signed)
PROGRESS NOTE    Jonathan Rosales  WFU:[id number] DOB: 1950-10-11 DOA: 01/18/2021 PCP: Claretta Fraise, MD   Brief Narrative:   Jonathan Rosales  is a 70 y.o. male, with current tobacco use, COPD, anxiety, CKD, coronary artery disease, hypertension, and more presents the ED with a chief complaint of dyspnea and altered mental status.  Patient has been admitted for acute combined respiratory failure in the setting of COPD exacerbation and bronchiectasis.  Pulmonology consultation obtained given his severity of COPD with apparent lack of progression.  Assessment & Plan:   Active Problems:   Essential hypertension   Gastroesophageal reflux disease without esophagitis   Current smoker   COPD exacerbation (HCC)   Acute respiratory failure with hypoxia and hypercapnia (HCC)   Acute metabolic encephalopathy   1-acute respiratory failure with hypoxia/hypercapnia in the setting of COPD exacerbation and bronchiectasis -Patient requiring BiPAP due to increased work of breathing and CO2 retention. -Patient continued to improve slowly; continue current management.  Will transfer to telemetry.  As he has not required BiPAP in 24 hours. -Continue PRN xanax to assist with tachypnea and anxiety symptoms. -Continue IV steroids, scheduled duo nebs, yulperi nebulizer and the use of Brovana and Pulmicort. -Patient will need outpatient follow-up with pulmonologist for repeat of PFTs and further adjustment to maintenance treatment. -Tobacco cessation counseling provided.   -continue BID PPI -Will continue treatment with doxycycline day 5/5. -Appreciate pulmonology evaluation to ensure optimal medical treatment -Likely will require oxygen on discharge -PT evaluation   2-Essential hypertension -Pulmonal -Continue current antihypertensive regimen.   3-Gastroesophageal reflux disease without esophagitis -Continue PPI.   4-Current smoker -Cessation counseling provided -Continue nicotine patch.    5-Acute metabolic encephalopathy -In the setting of respiratory failure with hypercapnia and hypoxia. -Improve mentation back to baseline currently. -Continue supportive care and treatment for COPD exacerbation as mentioned above.   6-history of coronary artery disease -Continue lisinopril, Coreg, statin and aspirin -Patient denies chest pain.   DVT prophylaxis: SCDs Code Status: Full Family Communication: Wife at bedside 9/21 Disposition Plan:  Status is: Inpatient  Remains inpatient appropriate because:IV treatments appropriate due to intensity of illness or inability to take PO and Inpatient level of care appropriate due to severity of illness  Dispo: The patient is from: Home              Anticipated d/c is to: Home              Patient currently is not medically stable to d/c.   Difficult to place patient No    Skin Assessment:  I have examined the patient's skin and I agree with the wound assessment as performed by the wound care RN as outlined below:  Pressure Injury 01/18/21 Coccyx Medial Unstageable - Full thickness tissue loss in which the base of the injury is covered by slough (yellow, tan, gray, green or brown) and/or eschar (tan, brown or black) in the wound bed. eschar (Active)  01/18/21 2339  Location: Coccyx  Location Orientation: Medial  Staging: Unstageable - Full thickness tissue loss in which the base of the injury is covered by slough (yellow, tan, gray, green or brown) and/or eschar (tan, brown or black) in the wound bed.  Wound Description (Comments): eschar  Present on Admission: Yes    Consultants:  PCCM  Procedures:  See below  Antimicrobials:  Anti-infectives (From admission, onward)    Start     Dose/Rate Route Frequency Ordered Stop   01/19/21 1200  doxycycline (VIBRA-TABS) tablet  100 mg        100 mg Oral Every 12 hours 01/19/21 1110        Subjective: Patient seen and evaluated today with ongoing shortness of breath and cough  with lack of progression during his hospitalization.  No acute overnight events noted.  Objective: Vitals:   01/24/21 0600 01/24/21 0800 01/24/21 0855 01/24/21 0900  BP: 133/84 139/79  (!) 141/93  Pulse: 69 82  84  Resp: 20 (!) 35  (!) 26  Temp:      TempSrc:      SpO2: 96% 90% 99% 96%  Weight:      Height:        Intake/Output Summary (Last 24 hours) at 01/24/2021 1035 Last data filed at 01/24/2021 0446 Gross per 24 hour  Intake 480 ml  Output 1300 ml  Net -820 ml   Filed Weights   01/22/21 0429 01/23/21 0440 01/24/21 0430  Weight: 59.8 kg 57.5 kg 58 kg    Examination:  General exam: Appears calm and comfortable  Respiratory system: Diminished bilaterally.  Respiratory effort normal.  4.5 L nasal cannula Cardiovascular system: S1 & S2 heard, RRR.  Gastrointestinal system: Abdomen is soft Central nervous system: Alert and awake Extremities: No edema Skin: No significant lesions noted Psychiatry: Flat affect.    Data Reviewed: I have personally reviewed following labs and imaging studies  CBC: Recent Labs  Lab 01/18/21 1759 01/19/21 0509 01/20/21 0427  WBC 12.1* 8.7 17.0*  NEUTROABS 10.6*  --   --   HGB 16.2 15.5 13.6  HCT 49.5 46.9 42.1  MCV 115.4* 115.8* 118.9*  PLT 411* 364 301   Basic Metabolic Panel: Recent Labs  Lab 01/18/21 1759 01/19/21 0509 01/20/21 0427  NA 137 137 138  K 4.2 4.4 4.7  CL 97* 98 98  CO2 32 29 33*  GLUCOSE 118* 115* 131*  BUN 18 24* 27*  CREATININE 0.95 1.15 0.97  CALCIUM 8.6* 8.2* 8.5*   GFR: Estimated Creatinine Clearance: 57.3 mL/min (by C-G formula based on SCr of 0.97 mg/dL). Liver Function Tests: Recent Labs  Lab 01/18/21 1759 01/19/21 0509  AST 21 16  ALT 31 26  ALKPHOS 71 62  BILITOT 0.4 0.5  PROT 6.4* 5.6*  ALBUMIN 3.5 3.0*   No results for input(s): LIPASE, AMYLASE in the last 168 hours. No results for input(s): AMMONIA in the last 168 hours. Coagulation Profile: No results for input(s): INR,  PROTIME in the last 168 hours. Cardiac Enzymes: No results for input(s): CKTOTAL, CKMB, CKMBINDEX, TROPONINI in the last 168 hours. BNP (last 3 results) No results for input(s): PROBNP in the last 8760 hours. HbA1C: No results for input(s): HGBA1C in the last 72 hours. CBG: Recent Labs  Lab 01/19/21 1635 01/22/21 2111  GLUCAP 163* 183*   Lipid Profile: No results for input(s): CHOL, HDL, LDLCALC, TRIG, CHOLHDL, LDLDIRECT in the last 72 hours. Thyroid Function Tests: No results for input(s): TSH, T4TOTAL, FREET4, T3FREE, THYROIDAB in the last 72 hours. Anemia Panel: No results for input(s): VITAMINB12, FOLATE, FERRITIN, TIBC, IRON, RETICCTPCT in the last 72 hours. Sepsis Labs: No results for input(s): PROCALCITON, LATICACIDVEN in the last 168 hours.  Recent Results (from the past 240 hour(s))  Resp Panel by RT-PCR (Flu A&B, Covid) Nasopharyngeal Swab     Status: None   Collection Time: 01/18/21  5:54 PM   Specimen: Nasopharyngeal Swab; Nasopharyngeal(NP) swabs in vial transport medium  Result Value Ref Range Status   SARS Coronavirus 2  by RT PCR NEGATIVE NEGATIVE Final    Comment: (NOTE) SARS-CoV-2 target nucleic acids are NOT DETECTED.  The SARS-CoV-2 RNA is generally detectable in upper respiratory specimens during the acute phase of infection. The lowest concentration of SARS-CoV-2 viral copies this assay can detect is 138 copies/mL. A negative result does not preclude SARS-Cov-2 infection and should not be used as the sole basis for treatment or other patient management decisions. A negative result may occur with  improper specimen collection/handling, submission of specimen other than nasopharyngeal swab, presence of viral mutation(s) within the areas targeted by this assay, and inadequate number of viral copies(<138 copies/mL). A negative result must be combined with clinical observations, patient history, and epidemiological information. The expected result is  Negative.  Fact Sheet for Patients:  EntrepreneurPulse.com.au  Fact Sheet for Healthcare Providers:  IncredibleEmployment.be  This test is no t yet approved or cleared by the [place] FDA and  has been authorized for detection and/or diagnosis of SARS-CoV-2 by FDA under an Emergency Use Authorization (EUA). This EUA will remain  in effect (meaning this test can be used) for the duration of the COVID-19 declaration under Section 564(b)(1) of the Act, 21 U.S.C.section 360bbb-3(b)(1), unless the authorization is terminated  or revoked sooner.       Influenza A by PCR NEGATIVE NEGATIVE Final   Influenza B by PCR NEGATIVE NEGATIVE Final    Comment: (NOTE) The Xpert Xpress SARS-CoV-2/FLU/RSV plus assay is intended as an aid in the diagnosis of influenza from Nasopharyngeal swab specimens and should not be used as a sole basis for treatment. Nasal washings and aspirates are unacceptable for Xpert Xpress SARS-CoV-2/FLU/RSV testing.  Fact Sheet for Patients: EntrepreneurPulse.com.au  Fact Sheet for Healthcare Providers: IncredibleEmployment.be  This test is not yet approved or cleared by the [place] FDA and has been authorized for detection and/or diagnosis of SARS-CoV-2 by FDA under an Emergency Use Authorization (EUA). This EUA will remain in effect (meaning this test can be used) for the duration of the COVID-19 declaration under Section 564(b)(1) of the Act, 21 U.S.C. section 360bbb-3(b)(1), unless the authorization is terminated or revoked.  Performed at [facility], [address], [place], [place] [address]   MRSA Next Gen by PCR, Nasal     Status: None   Collection Time: 01/18/21 11:12 PM   Specimen: Nasal Mucosa; Nasal Swab  Result Value Ref Range Status   MRSA by PCR Next Gen NOT DETECTED NOT DETECTED Final    Comment: (NOTE) The GeneXpert MRSA Assay (FDA approved for NASAL specimens  only), is one component of a comprehensive MRSA colonization surveillance program. It is not intended to diagnose MRSA infection nor to guide or monitor treatment for MRSA infections. Test performance is not FDA approved in patients less than 74 years old. Performed at [facility], [address] [place], [place] [address]          Radiology Studies: No results found.      Scheduled Meds:  arformoterol  15 mcg Nebulization BID   aspirin EC  81 mg Oral QHS   atorvastatin  80 mg Oral QHS   budesonide (PULMICORT) nebulizer solution  0.5 mg Nebulization BID   carvedilol  6.25 mg Oral BID WC   Chlorhexidine Gluconate Cloth  6 each Topical Daily   doxycycline  100 mg Oral Q12H   heparin  5,000 Units Subcutaneous Q8H   hydroxyurea  500 mg Oral Q breakfast   methylPREDNISolone (SOLU-MEDROL) injection  80 mg Intravenous Q12H  nicotine  21 mg Transdermal Daily   pantoprazole  40 mg Oral BID AC   revefenacin  175 mcg Nebulization Daily   sucralfate  1 g Oral TID WC & HS   Continuous Infusions:  methocarbamol (ROBAXIN) IV       LOS: 6 days    Time spent: 35 minutes    Refael Fulop Darleen Crocker, DO [facility]  If 7PM-7AM, please contact night-coverage [email or web address] 01/24/2021, 10:35 AM

## 2021-01-24 NOTE — Consult Note (Signed)
Consultation Note Date: 01/24/2021   Patient Name: Jonathan Rosales  DOB: [birth year]  MRN: [id number]  Age / Sex: 70 y.o., male  PCP: Jonathan Fraise, MD Referring Physician: Rodena Goldmann, DO  Reason for Consultation: Establishing goals of care  HPI/Patient Profile: 70 y.o. male  with past medical history of current tobacco use, COPD, anxiety, CKD, coronary artery disease, hypertension admitted on 01/18/2021 with acute respiratory failure with hypoxia/hypercapnia with COPD exacerbation and bronchiectasis.   Clinical Assessment and Goals of Care: I have reviewed medical records including EPIC notes, labs and imaging, received report from RN, assessed the patient.  Mr. Jonathan, Rosales, is lying quietly in bed.  He will briefly make but not keep eye contact.  He appears acutely/chronically ill and quite frail.  He has marked work of breathing and is asking to be placed on BiPAP.  Before being placed on BiPAP he was able to tell me his name, that we are in the hospital (although initially he states he done, he corrects to Jonathan Rosales ), but is incorrect in giving the month.  His wife of 52 years, Jonathan Rosales, is at bedside.  We meet to discuss diagnosis prognosis, GOC, EOL wishes, disposition and options.  I introduced Palliative Medicine as specialized medical care for people living with serious illness. It focuses on providing relief from the symptoms and stress of a serious illness. The goal is to improve quality of life for both the patient and the family.  We discussed a brief life review of the patient.  Mr. and [name] have been married for 46 years.  They have no children.  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  Jonathan Rosales states that he was working 2 weeks ago.  She shares that he had retired, but that he has no hobbies or outside interests and decided to return to work.   We then focused on their current  illness.  Jonathan Rosales shares that Jonathan Rosales has been in the hospital since last Thursday and she has not seen any meaningful improvements.  She tells me that matter fact she feels that he is worse.  She shares that he is spending more time on BiPAP then off.  She shares that it is very unusual for him to request BiPAP, he usually dislikes wearing it.  We talked about her visit with the attending hospitalist this morning.     Advanced directives, concepts specific to code status, were considered and discussed with hospitalist this morning.  Mrs. Grine elects DNR.  She shares that Jonathan Rosales was intubated for 3 weeks in the past.  She shares a story of decline over the past year in particular.  She shares that he is gotten weaker since this spring when he had COVID.  She shares that there was a decline last year when he had pneumonia.  He tells me that this started a few years ago when he had MRSA which she feels damaged his organs.  The difference between aggressive medical intervention and comfort care was considered in light  of the patient's goals of care.  We talked about in-hospital hospice type care.  Jonathan Rosales asks about transitioning home with hospice.  I share that, at this point, Jonathan Rosales is too unstable to transfer their home or to residential hospice.  Jonathan Rosales shares a story of friend/neighbor who have had hospice care.  Face-to-face conference with pulmonologist and bedside nursing staff.  Sometimes, the addition of morphine for breathlessness can improve conditions.  At this point it would be beneficial to add morphine, reevaluate 24 hours later.  Discussed the importance of continued conversation with family and the medical providers regarding overall plan of care and treatment options, ensuring decisions are within the context of the patient's values and GOCs. Questions and concerns were addressed. The family was encouraged to call with questions or concerns.  PMT will continue to support holistically.  Conference  with attending, pulmonology, bedside nursing staff, transition of care team related to patient condition, needs, goals of care, disposition. PMT to evaluate 9/22.   HCPOA   NEXT OF KIN -wife of 19 years, Jonathan Rosales.  She tells me that they have no children.    SUMMARY OF RECOMMENDATIONS   In-hospital hospice type care. Scheduled morphine Reevaluate full comfort care tomorrow.   Code Status/Advance Care Planning: DNR  Symptom Management:  Scheduled morphine added  Palliative Prophylaxis:  Frequent Pain Assessment and Oral Care  Additional Recommendations (Limitations, Scope, Preferences): In-hospital hospice type care  Psycho-social/Spiritual:  Desire for further Chaplaincy support:no Additional Recommendations: Caregiving  Support/Resources and Education on Hospice  Prognosis:  Unable to determine, based on outcomes.  Scheduled morphine may assist/improve outcomes for [name].  Discharge Planning:  To be determined, based on outcomes.  Too unstable to transfer to residential hospice at this point.       Primary Diagnoses: Present on Admission:  COPD exacerbation ([place])  Essential hypertension  Current smoker  Gastroesophageal reflux disease without esophagitis  Acute respiratory failure with hypoxia and hypercapnia (HCC)  Acute metabolic encephalopathy   I have reviewed the medical record, interviewed the patient and family, and examined the patient. The following aspects are pertinent.  Past Medical History:  Diagnosis Date   Allergy    Anemia    Anxiety    Blood transfusion without reported diagnosis    Cataract    Chronic kidney disease    COPD (chronic obstructive pulmonary disease) ([place])    Coronary artery disease    COVID    Emphysema of lung ([place])    Hypertension    Social History   Socioeconomic History   Marital status: Married    Spouse name: Cannan   Number of children: 0   Years of education: 12   Highest education level: High school  graduate  Occupational History    Employer: [employer]  Tobacco Use   Smoking status: Every Day    Packs/day: 1.00    Types: Cigarettes   Smokeless tobacco: Never  Vaping Use   Vaping Use: Never used  Substance and Sexual Activity   Alcohol use: No   Drug use: No   Sexual activity: Not Currently  Other Topics Concern   Not on file  Social History Narrative   Not on file   Social Determinants of Health   Financial Resource Strain: Not on file  Food Insecurity: Not on file  Transportation Needs: Not on file  Physical Activity: Not on file  Stress: Not on file  Social Connections: Not on file   Family History  Problem Relation  Age of Onset   COPD Mother    Depression Mother    Diabetes Mother    Kidney disease Mother    Heart disease Father    Alcohol abuse Brother    Scheduled Meds:  arformoterol  15 mcg Nebulization BID   aspirin EC  81 mg Oral QHS   atorvastatin  80 mg Oral QHS   budesonide (PULMICORT) nebulizer solution  0.5 mg Nebulization BID   carvedilol  6.25 mg Oral BID WC   Chlorhexidine Gluconate Cloth  6 each Topical Daily   cloNIDine  0.1 mg Oral BID   doxycycline  100 mg Oral Q12H   heparin  5,000 Units Subcutaneous Q8H   hydroxyurea  500 mg Oral Q breakfast   methylPREDNISolone (SOLU-MEDROL) injection  80 mg Intravenous Q12H   nicotine  21 mg Transdermal Daily   pantoprazole  40 mg Oral BID AC   revefenacin  175 mcg Nebulization Daily   sucralfate  1 g Oral TID WC & HS   Continuous Infusions:  methocarbamol (ROBAXIN) IV     PRN Meds:.acetaminophen **OR** acetaminophen, albuterol, ALPRAZolam, methocarbamol (ROBAXIN) IV, ondansetron **OR** ondansetron (ZOFRAN) IV Medications Prior to Admission:  Prior to Admission medications   Medication Sig Start Date End Date Taking? Authorizing Provider  acetaminophen (TYLENOL) 500 MG tablet Take 500 mg by mouth daily as needed for headache (pain).    Yes [provider]  albuterol (VENTOLIN HFA)  108 (90 Base) MCG/ACT inhaler INHALE 2 PUFFS EVERY 6 HOURS AS NEEDED FOR WHEEZING OR SHORTNESS OF BREATH Patient taking differently: Inhale 2 puffs into the lungs every 6 (six) hours as needed for shortness of breath. 10/09/20  Yes Jonathan Fraise, MD  aspirin EC 81 MG tablet Take 81 mg by mouth at bedtime.    Yes [provider]  atorvastatin (LIPITOR) 80 MG tablet Take 1 tablet (80 mg total) by mouth at bedtime. 10/30/20  Yes Stacks, Cletus Gash, MD  carvedilol (COREG) 6.25 MG tablet TAKE 1 TABLET 2 TIMES A DAY WITH A MEAL 10/30/20  Yes Stacks, Cletus Gash, MD  clonazePAM (KLONOPIN) 0.5 MG tablet Take 0.5 tablets (0.25 mg total) by mouth daily as needed for anxiety. Patient taking differently: Take 0.25 mg by mouth daily. 09/04/20  Yes Jonathan Fraise, MD  escitalopram (LEXAPRO) 10 MG tablet Take 1 tablet (10 mg total) by mouth daily. 10/30/20  Yes Jonathan Fraise, MD  ferrous sulfate (FEROSUL) 325 (65 FE) MG tablet Take 1 tablet (325 mg total) by mouth daily with breakfast. 10/30/20  Yes Jonathan Fraise, MD  fluticasone (FLONASE) 50 MCG/ACT nasal spray Place 2 sprays into both nostrils daily as needed (sinus headache).    Yes [provider]  hydroxyurea (HYDREA) 500 MG capsule Take 1 capsule (500 mg total) by mouth daily. May take with food to minimize GI side effects. 01/01/21  Yes Nicholas Lose, MD  lisinopril (ZESTRIL) 20 MG tablet TAKE 1 TABLET EVERY DAY 01/11/21  Yes Jonathan Fraise, MD  loratadine (CLARITIN) 10 MG tablet Take 10 mg by mouth daily as needed (seasonall allergies).    Yes [provider]  pantoprazole (PROTONIX) 40 MG tablet Take 1 tablet (40 mg total) by mouth 2 (two) times daily. 10/30/20  Yes Stacks, Cletus Gash, MD  polyethylene glycol (MIRALAX) 17 g packet Take 17 g by mouth daily. Dissolve one cap full in solution (water, gatorade, etc.) and administer once cap-full daily. You may titrate up daily by 1 cap-full until the patient is having pudding consistency of stools.  After  the patient is able to start passing softer stools they will need to be on 1/2 cap-full daily for 2 weeks. 01/06/21  Yes Couture, Cortni S, PA-C  STIOLTO RESPIMAT 2.5-2.5 MCG/ACT AERS Inhale 2 puffs into the lungs daily. 12/25/20  Yes Stacks, Cletus Gash, MD  sucralfate (CARAFATE) 1 g tablet TAKE 1 TABLET FOUR TIMES DAILY BEFORE MEALS & AT BEDTIME 10/30/20  Yes Stacks, Cletus Gash, MD  zinc sulfate 220 (50 Zn) MG capsule Take 220 mg by mouth daily.   Yes [provider]  ondansetron (ZOFRAN-ODT) 8 MG disintegrating tablet Take 1 tablet (8 mg total) by mouth every 6 (six) hours as needed for nausea or vomiting. Patient not taking: No sig reported 05/15/20   Jonathan Fraise, MD  vitamin B-12 (CYANOCOBALAMIN) 1000 MCG tablet Take 1,000 mcg by mouth daily. Patient not taking: No sig reported    [provider]   Allergies  Allergen Reactions   Duloxetine Hcl Nausea And Vomiting   Other Other (See Comments)   Review of Systems  Unable to perform ROS: Severe respiratory distress   Physical Exam Vitals and nursing note reviewed.  Constitutional:      General: He is in acute distress.     Appearance: He is ill-appearing.  Cardiovascular:     Rate and Rhythm: Normal rate.  Pulmonary:     Comments: BiPAP in place Abdominal:     Palpations: Abdomen is soft.  Skin:    General: Skin is warm and dry.  Neurological:     Mental Status: He is alert.     Comments: Unable to ask orientation questions 2/2 breathing  Psychiatric:        Mood and Affect: Mood is anxious.    Vital Signs: BP (!) 165/90   Pulse 71   Temp 97.8 F (36.6 C) (Oral)   Resp 17   Ht 5\' 8"  (1.727 m)   Wt 58 kg   SpO2 96%   BMI 19.44 kg/m  Pain Scale: 0-10   Pain Score: 0-No pain   SpO2: SpO2: 96 % O2 Device:SpO2: 96 % O2 Flow Rate: .O2 Flow Rate (L/min): 4.5 L/min  IO: Intake/output summary:  Intake/Output Summary (Last 24 hours) at 01/24/2021 1434 Last data filed at 01/24/2021 0446 Gross per 24 hour   Intake 240 ml  Output 1300 ml  Net -1060 ml    LBM: Last BM Date: 01/23/21 Baseline Weight: Weight: 54.4 kg Most recent weight: Weight: 58 kg     Palliative Assessment/Data:   Flowsheet Rows    Flowsheet Row Most Recent Value  Intake Tab   Referral Department Hospitalist  Unit at Time of Referral Cardiac/Telemetry Unit  Palliative Care Primary Diagnosis Pulmonary  Date Notified 01/24/21  Palliative Care Type New Palliative care  Reason for referral Clarify Goals of Care  Date of Admission 01/18/21  Date first seen by Palliative Care 01/24/21  # of days Palliative referral response time 0 Day(s)  # of days IP prior to Palliative referral 6  Clinical Assessment   Palliative Performance Scale Score 20%  Pain Max last 24 hours Not able to report  Pain Min Last 24 hours Not able to report  Dyspnea Max Last 24 Hours Not able to report  Dyspnea Min Last 24 hours Not able to report  Psychosocial & Spiritual Assessment   Palliative Care Outcomes        Time In: 1050 Time Out: 1200 Time Total: 70 minutes  Greater than 50%  of this  time was spent counseling and coordinating care related to the above assessment and plan.  Signed by: Drue Novel, NP   Please contact Palliative Medicine Team phone at [phone] for questions and concerns.  For individual provider: See Shea Evans

## 2021-01-24 NOTE — Consult Note (Signed)
NAME:  Jonathan Rosales, MRN:  [id number], DOB:  January 06, 1951, LOS: 6 ADMISSION DATE:  01/18/2021, CONSULTATION DATE:  9/21 REFERRING MD:  Manuella Ghazi, [facility], CHIEF COMPLAINT:  copd exac with refractory sob  and ams    History of Present Illness:  16 yowm active smoker with GOLD IV copd followed by [facility] chest on stiolto on admit 9/15 with aecopd  / ams with limited hx obtained.  Wife says limit was 25 feet s 02 prior to admit    Pertinent  Medical History  70 y.o. male, with current tobacco use, COPD, anxiety, CKD, coronary artery disease, hypertension, and more presents the ED with a chief complaint of dyspnea and altered mental status  Significant Hospital Events: Including procedures, antibiotic start and stop dates in addition to other pertinent events   Doxy 9/16 >>> D/c'd lisinopril 9/21   Scheduled Meds:  arformoterol  15 mcg Nebulization BID   aspirin EC  81 mg Oral QHS   atorvastatin  80 mg Oral QHS   budesonide (PULMICORT) nebulizer solution  0.5 mg Nebulization BID   carvedilol  6.25 mg Oral BID WC   Chlorhexidine Gluconate Cloth  6 each Topical Daily   doxycycline  100 mg Oral Q12H   heparin  5,000 Units Subcutaneous Q8H   hydroxyurea  500 mg Oral Q breakfast   methylPREDNISolone (SOLU-MEDROL) injection  80 mg Intravenous Q12H   nicotine  21 mg Transdermal Daily   pantoprazole  40 mg Oral BID AC   revefenacin  175 mcg Nebulization Daily   sucralfate  1 g Oral TID WC & HS   Continuous Infusions:  methocarbamol (ROBAXIN) IV     PRN Meds:.acetaminophen **OR** acetaminophen, albuterol, ALPRAZolam, methocarbamol (ROBAXIN) IV, ondansetron **OR** ondansetron (ZOFRAN) IV    Interim History / Subjective:  Bipap dep/ now DNR and palliative care starting low dose Morphine for air hunger      Objective   Blood pressure (!) 141/93, pulse 84, temperature 97.8 F (36.6 C), temperature source Oral, resp. rate (!) 26, height 5\' 8"  (1.727 m), weight 58 kg, SpO2 96 %.    Vent  Mode: PCV;BIPAP FiO2 (%):  [30 %] 30 % Set Rate:  [16 bmp] 16 bmp PEEP:  [8 cmH20] 8 cmH20   Intake/Output Summary (Last 24 hours) at 01/24/2021 1032 Last data filed at 01/24/2021 0446 Gross per 24 hour  Intake 480 ml  Output 1300 ml  Net -820 ml   Filed Weights   01/22/21 0429 01/23/21 0440 01/24/21 0430  Weight: 59.8 kg 57.5 kg 58 kg    Examination: Tmax: 97.8  BIPAP ST  16 rate, 8 peep FIO2 0.30  > sats 94$% General: elderly wm with high wob at rest HENT: bipa mask in place Lungs: distant bs, air trapping with a few insp crackles in bases  Cardiovascular: RRR distant S1S2 no def s3 Abdomen: soft/ pos hoover's  Extremities: edema 1 + sym bilaterally Neuro: anxious, moving all 4 spont        I personally reviewed images and agree with radiology impression as follows:  CXR:   portable 01/18/21  Severe copd, no actived dz My review:  cm is present considering size of chest.  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  DDX of  difficult airways management almost all start with A and  include Adherence, Ace Inhibitors, Acid Reflux, Active Sinus Disease, Alpha 1 Antitripsin deficiency, Anxiety masquerading as Airways dz,  ABPA,  Allergy(esp in young), Aspiration (esp in elderly), Adverse effects of meds,  Active smoking or vaping, A bunch of PE's (a small clot burden can't cause this syndrome unless there is already severe underlying pulm or vascular dz with poor reserve) plus two Bs  = Bronchiectasis and Beta blocker use..and one C= CHF   Adherence is always the initial "prime suspect" and is a multilayered concern that requires a "trust but verify" approach in every patient - starting with knowing how to use medications, especially inhalers, correctly, keeping up with refills and understanding the fundamental difference between maintenance and prns vs those medications only taken for a very short course and then stopped and not  refilled.   Since now adherent just need to be sure when returns he bring all meds in hand using a trust but verify approach to confirm accurate Medication  Reconciliation The principal here is that until we are certain that the  patients are doing what we've asked, it makes no sense to ask them to do more.   Actively smoking PTA  ACEi adverse effects at the  top of the usual list of suspects and the only way to rule it out is a trial off > see a/p    ? Acid (or non-acid) GERD > always difficult to exclude as up to 75% of pts in some series report no assoc GI/ Heartburn symptoms> rec continue max (24h)  acid suppression and diet restrictions/ reviewed      ? Anxiety/depression  > usually at the bottom of this list of usual suspects  and note already on psychotropics and may interfere with adherence and also interpretation of response or lack thereof to symptom management which can be quite subjective.   ? CHF  mod AR with nl LA  12/18/20 > recheck cxr/ bnp    2) HBP/ anxiety  - needs trial off acei.  ACE inhibitors are problematic in  pts with airway complaints because  even experienced pulmonologists can't always distinguish ace effects from copd/asthma.  By themselves they don't actually cause a problem, much like oxygen can't by itself start a fire, but they certainly serve as a powerful catalyst or enhancer for any "fire"  or inflammatory process in the upper airway, be it caused by an ET  tube or more commonly reflux (especially in the obese or pts with known GERD or who are on biphoshonates).    In the era of ARB near equivalency until we have a better handle on the reversibility of the airway problem, it just makes sense to avoid ACEI  entirely in the short run and then decide later, having established a level of airway control using a reasonable limited regimen, whether to add back ace but even then being very careful to observe the pt for worsening airway control and number of meds used/  needed to control symptoms.    >>> rec clonidine which will help with anxiety    3) acute on chronic hypoxemic/hypercarbic resp failure Though somewhat paradoxic, when the lung fails to clear C02 properly and pC02 rises the lung then becomes a more efficient scavenger of C02 allowing lower work of breathing and  better C02 clearance albeit at a higher serum pC02 level - this is why pts can look a lot better than their ABG's would suggest and why it's so difficult to prognosticate endstage dz.  It's also why I strongly rec DNI status (ventilating pts down to a nl pC02 adversely affects this compensatory mechanism)   >>>  agree with low dose MS / DNR approp     Labs   CBC: Recent Labs  Lab 01/18/21 1759 01/19/21 0509 01/20/21 0427  WBC 12.1* 8.7 17.0*  NEUTROABS 10.6*  --   --   HGB 16.2 15.5 13.6  HCT 49.5 46.9 42.1  MCV 115.4* 115.8* 118.9*  PLT 411* 364 716    Basic Metabolic Panel: Recent Labs  Lab 01/18/21 1759 01/19/21 0509 01/20/21 0427  NA 137 137 138  K 4.2 4.4 4.7  CL 97* 98 98  CO2 32 29 33*  GLUCOSE 118* 115* 131*  BUN 18 24* 27*  CREATININE 0.95 1.15 0.97  CALCIUM 8.6* 8.2* 8.5*   GFR: Estimated Creatinine Clearance: 57.3 mL/min (by C-G formula based on SCr of 0.97 mg/dL). Recent Labs  Lab 01/18/21 1759 01/19/21 0509 01/20/21 0427  WBC 12.1* 8.7 17.0*    Liver Function Tests: Recent Labs  Lab 01/18/21 1759 01/19/21 0509  AST 21 16  ALT 31 26  ALKPHOS 71 62  BILITOT 0.4 0.5  PROT 6.4* 5.6*  ALBUMIN 3.5 3.0*   No results for input(s): LIPASE, AMYLASE in the last 168 hours. No results for input(s): AMMONIA in the last 168 hours.  ABG    Component Value Date/Time   PHART 7.334 (L) 01/19/2021 0117   PCO2ART 62.3 (H) 01/19/2021 0117   PO2ART 81.1 (L) 01/19/2021 0117   HCO3 27.5 01/19/2021 0509   O2SAT 78.1 01/19/2021 0509     Coagulation Profile: No results for input(s): INR, PROTIME in the last 168 hours.  Cardiac Enzymes: No  results for input(s): CKTOTAL, CKMB, CKMBINDEX, TROPONINI in the last 168 hours.  HbA1C: No results found for: HGBA1C  CBG: Recent Labs  Lab 01/19/21 1635 01/22/21 2111  GLUCAP 163* 183*       Past Medical History:  He,  has a past medical history of Allergy, Anemia, Anxiety, Blood transfusion without reported diagnosis, Cataract, Chronic kidney disease, COPD (chronic obstructive pulmonary disease) ([place]), Coronary artery disease, COVID, Emphysema of lung ([place]), and Hypertension.   Surgical History:   Past Surgical History:  Procedure Laterality Date   CARDIAC SURGERY     CORONARY ARTERY BYPASS GRAFT     REPAIR OF PERFORATED ULCER  2017     Social History:   reports that he has been smoking cigarettes. He has been smoking an average of 1 pack per day. He has never used smokeless tobacco. He reports that he does not drink alcohol and does not use drugs.   Family History:  His family history includes Alcohol abuse in his brother; COPD in his mother; Depression in his mother; Diabetes in his mother; Heart disease in his father; Kidney disease in his mother.   Allergies Allergies  Allergen Reactions   Duloxetine Hcl Nausea And Vomiting   Other Other (See Comments)     Home Medications  Prior to Admission medications   Medication Sig Start Date End Date Taking? Authorizing Provider  acetaminophen (TYLENOL) 500 MG tablet Take 500 mg by mouth daily as needed for headache (pain).    Yes [provider]  albuterol (VENTOLIN HFA) 108 (90 Base) MCG/ACT inhaler INHALE 2 PUFFS EVERY 6 HOURS AS NEEDED FOR WHEEZING OR SHORTNESS OF BREATH Patient taking differently: Inhale 2 puffs into the lungs every 6 (six) hours as needed for shortness of breath. 10/09/20  Yes Claretta Fraise, MD  aspirin EC 81 MG tablet Take 81 mg by mouth at bedtime.    Yes [provider]  atorvastatin (LIPITOR) 80 MG tablet Take 1 tablet (80 mg total) by mouth at bedtime. 10/30/20  Yes Stacks,  Cletus Gash, MD  carvedilol (COREG) 6.25 MG tablet TAKE 1 TABLET 2 TIMES A DAY WITH A MEAL 10/30/20  Yes Stacks, Cletus Gash, MD  clonazePAM (KLONOPIN) 0.5 MG tablet Take 0.5 tablets (0.25 mg total) by mouth daily as needed for anxiety. Patient taking differently: Take 0.25 mg by mouth daily. 09/04/20  Yes Claretta Fraise, MD  escitalopram (LEXAPRO) 10 MG tablet Take 1 tablet (10 mg total) by mouth daily. 10/30/20  Yes Claretta Fraise, MD  ferrous sulfate (FEROSUL) 325 (65 FE) MG tablet Take 1 tablet (325 mg total) by mouth daily with breakfast. 10/30/20  Yes Claretta Fraise, MD  fluticasone (FLONASE) 50 MCG/ACT nasal spray Place 2 sprays into both nostrils daily as needed (sinus headache).    Yes [provider]  hydroxyurea (HYDREA) 500 MG capsule Take 1 capsule (500 mg total) by mouth daily. May take with food to minimize GI side effects. 01/01/21  Yes Nicholas Lose, MD  lisinopril (ZESTRIL) 20 MG tablet TAKE 1 TABLET EVERY DAY 01/11/21  Yes Claretta Fraise, MD  loratadine (CLARITIN) 10 MG tablet Take 10 mg by mouth daily as needed (seasonall allergies).    Yes [provider]  pantoprazole (PROTONIX) 40 MG tablet Take 1 tablet (40 mg total) by mouth 2 (two) times daily. 10/30/20  Yes Stacks, Cletus Gash, MD  polyethylene glycol (MIRALAX) 17 g packet Take 17 g by mouth daily. Dissolve one cap full in solution (water, gatorade, etc.) and administer once cap-full daily. You may titrate up daily by 1 cap-full until the patient is having pudding consistency of stools. After the patient is able to start passing softer stools they will need to be on 1/2 cap-full daily for 2 weeks. 01/06/21  Yes Couture, Cortni S, PA-C  STIOLTO RESPIMAT 2.5-2.5 MCG/ACT AERS Inhale 2 puffs into the lungs daily. 12/25/20  Yes Stacks, Cletus Gash, MD  sucralfate (CARAFATE) 1 g tablet TAKE 1 TABLET FOUR TIMES DAILY BEFORE MEALS & AT BEDTIME 10/30/20  Yes Stacks, Cletus Gash, MD  zinc sulfate 220 (50 Zn) MG capsule Take 220 mg by mouth daily.   Yes  [provider]  ondansetron (ZOFRAN-ODT) 8 MG disintegrating tablet Take 1 tablet (8 mg total) by mouth every 6 (six) hours as needed for nausea or vomiting. Patient not taking: No sig reported 05/15/20   Claretta Fraise, MD  vitamin B-12 (CYANOCOBALAMIN) 1000 MCG tablet Take 1,000 mcg by mouth daily. Patient not taking: No sig reported    [provider]     The patient is critically ill with multiple organ systems failure and requires high complexity decision making for assessment and support, frequent evaluation and titration of therapies, application of advanced monitoring technologies and extensive interpretation of multiple databases. Critical Care Time devoted to patient care services described in this note is 50 minutes.   Christinia Gully, MD [facility] [place] [phone]   After 7:00 pm call Elink  [phone]   ]

## 2021-01-25 DIAGNOSIS — J9621 Acute and chronic respiratory failure with hypoxia: Secondary | ICD-10-CM | POA: Diagnosis not present

## 2021-01-25 DIAGNOSIS — G9341 Metabolic encephalopathy: Secondary | ICD-10-CM | POA: Diagnosis not present

## 2021-01-25 DIAGNOSIS — Z515 Encounter for palliative care: Secondary | ICD-10-CM | POA: Diagnosis not present

## 2021-01-25 DIAGNOSIS — J9622 Acute and chronic respiratory failure with hypercapnia: Secondary | ICD-10-CM | POA: Diagnosis not present

## 2021-01-25 DIAGNOSIS — J9602 Acute respiratory failure with hypercapnia: Secondary | ICD-10-CM | POA: Diagnosis not present

## 2021-01-25 DIAGNOSIS — Z7189 Other specified counseling: Secondary | ICD-10-CM | POA: Diagnosis not present

## 2021-01-25 DIAGNOSIS — J9601 Acute respiratory failure with hypoxia: Secondary | ICD-10-CM | POA: Diagnosis not present

## 2021-01-25 LAB — BASIC METABOLIC PANEL
Anion gap: 8 (ref 5–15)
BUN: 26 mg/dL — ABNORMAL HIGH (ref 8–23)
CO2: 44 mmol/L — ABNORMAL HIGH (ref 22–32)
Calcium: 8.6 mg/dL — ABNORMAL LOW (ref 8.9–10.3)
Chloride: 87 mmol/L — ABNORMAL LOW (ref 98–111)
Creatinine, Ser: 0.84 mg/dL (ref 0.61–1.24)
GFR, Estimated: >60 mL/min (ref >60)
Glucose, Bld: 138 mg/dL — ABNORMAL HIGH (ref 70–99)
Potassium: 4 mmol/L (ref 3.5–5.1)
Sodium: 139 mmol/L (ref 135–145)

## 2021-01-25 LAB — CBC
HCT: 43.5 % (ref 39.0–52.0)
Hemoglobin: 14.1 g/dL (ref 13.0–17.0)
MCH: 37.9 pg — ABNORMAL HIGH (ref 26.0–34.0)
MCHC: 32.4 g/dL (ref 30.0–36.0)
MCV: 116.9 fL — ABNORMAL HIGH (ref 80.0–100.0)
Platelets: 303 10*3/uL (ref 150–400)
RBC: 3.72 MIL/uL — ABNORMAL LOW (ref 4.22–5.81)
RDW: 14.2 % (ref 11.5–15.5)
WBC: 13.6 10*3/uL — ABNORMAL HIGH (ref 4.0–10.5)
nRBC: 0 % (ref 0.0–0.2)

## 2021-01-25 LAB — MAGNESIUM: Magnesium: 2.2 mg/dL (ref 1.7–2.4)

## 2021-01-25 MED ORDER — HALOPERIDOL LACTATE 5 MG/ML IJ SOLN
0.5000 mg | INTRAMUSCULAR | Status: DC | PRN
  Administered 2021-01-29: 0.5 mg via INTRAVENOUS
  Filled 2021-01-25: qty 1

## 2021-01-25 MED ORDER — GLYCOPYRROLATE 1 MG PO TABS: 1.0000 mg | ORAL_TABLET | ORAL | Status: DC | PRN

## 2021-01-25 MED ORDER — BIOTENE DRY MOUTH MT LIQD: 15.0000 mL | OROMUCOSAL | Status: DC | PRN

## 2021-01-25 MED ORDER — GLYCOPYRROLATE 0.2 MG/ML IJ SOLN: 0.2000 mg | INTRAMUSCULAR | Status: DC | PRN

## 2021-01-25 MED ORDER — HALOPERIDOL LACTATE 2 MG/ML PO CONC
0.5000 mg | ORAL | Status: DC | PRN
  Filled 2021-01-25: qty 0.3

## 2021-01-25 MED ORDER — ONDANSETRON HCL 4 MG/2ML IJ SOLN: 4.0000 mg | Freq: Four times a day (QID) | INTRAMUSCULAR | Status: DC | PRN

## 2021-01-25 MED ORDER — POLYVINYL ALCOHOL 1.4 % OP SOLN: 1.0000 [drp] | Freq: Four times a day (QID) | OPHTHALMIC | Status: DC | PRN

## 2021-01-25 MED ORDER — ACETAMINOPHEN 650 MG RE SUPP: 650.0000 mg | Freq: Four times a day (QID) | RECTAL | Status: DC | PRN

## 2021-01-25 MED ORDER — LORAZEPAM 2 MG/ML IJ SOLN
0.5000 mg | INTRAMUSCULAR | Status: DC | PRN
  Administered 2021-01-25 – 2021-01-30 (×9): 1 mg via INTRAVENOUS
  Administered 2021-01-30: 0.5 mg via INTRAVENOUS
  Administered 2021-01-30 – 2021-01-31 (×2): 1 mg via INTRAVENOUS
  Filled 2021-01-25 (×12): qty 1

## 2021-01-25 MED ORDER — ONDANSETRON 4 MG PO TBDP: 4.0000 mg | ORAL_TABLET | Freq: Four times a day (QID) | ORAL | Status: DC | PRN

## 2021-01-25 MED ORDER — MORPHINE SULFATE (PF) 2 MG/ML IV SOLN
2.0000 mg | INTRAVENOUS | Status: DC
  Administered 2021-01-25 – 2021-01-31 (×27): 2 mg via INTRAVENOUS
  Filled 2021-01-25 (×29): qty 1

## 2021-01-25 MED ORDER — ACETAMINOPHEN 325 MG PO TABS
650.0000 mg | ORAL_TABLET | Freq: Four times a day (QID) | ORAL | Status: DC | PRN
  Administered 2021-01-30: 650 mg via ORAL
  Filled 2021-01-25: qty 2

## 2021-01-25 MED ORDER — HALOPERIDOL 0.5 MG PO TABS: 0.5000 mg | ORAL_TABLET | ORAL | Status: DC | PRN

## 2021-01-25 NOTE — Progress Notes (Addendum)
Palliative: [name], Jonathan Rosales, is sitting up in bed.  He greets me making and mostly keeping eye contact.  He appears acutely/chronically ill and frail.  He has noted work of breathing.  He is alert and oriented x3, able to make his needs known.  His wife, Jonathan Rosales, is at bedside.  We talked about how he is doing, how his night was.  [name] tells me that he feels that he is about the same.  We talked about his acute health concerns, his breathing.  At this point he tells me he would like to continue to tried to get better.  I ask where he sees himself going when he leaves the hospital.  Jonathan Rosales tells me that he does not know.  Conference with wife outside of the room.  We talked about [name] symptom management, his acute illness.  I share that because he is alert and oriented, we must respect his wishes, and at this point his wish is to continue with treatment.  We talked about increasing symptom management due to consistently high respiratory rate.  I share that we will place BiPAP at [name] request.  I share that it would not be surprising if he worsens, falls into a coma like state.  If he is unable to make his needs known, Jonathan Rosales would want to focus on comfort and dignity, hospice care.  Will return later in the day to find [name], Jonathan Rosales, resting comfortably.  He does not interact with me in any meaningful way.  He appears more comfortable than he has in many days.  Conference with bedside nursing staff and attending.  Call wife, Jonathan Rosales.  We talk about symptom management, keeping him comfortable.  [name] shares that she is pleased to know her husband is finally resting.  She tells me that she cannot stay at the hospital due to her own health concerns.  Jonathan Rosales would like to be notified if time is getting short, and would like the opportunity to be with him if at all possible.  She shares, "I know that he is going to die" and  "I want to keep him comfortable".  We  transition to full comfort care.  Orders adjusted.  We talked about residential hospice if [name] is stable.  Jonathan Rosales states that at this point she would prefer that he stay in the hospital.  We talked about evaluation for residential hospice tomorrow.  Conference with attending, bedside nursing staff, transition of care team related to patient condition, needs, goals of care, disposition. [place] form completed and placed on chart. End-of-life order set implemented.  Plan: Full comfort care, end-of-life order set implemented.  Anticipate in-hospital death in 08/09/22 to 48 hours or less.  65 minutes, extended time Jonathan Axe, NP Palliative medicine team Team phone [phone] Greater than 50% of this time was spent counseling and coordinating care related to the above assessment and plan.

## 2021-01-25 NOTE — Progress Notes (Signed)
PROGRESS NOTE    [name]  VZC:[id number] DOB: [birth year] DOA: 01/18/2021 PCP: Claretta Fraise, MD   Brief Narrative:   Jonathan Rosales  is a 70 y.o. male, with current tobacco use, COPD, anxiety, CKD, coronary artery disease, hypertension, and more presents the ED with a chief complaint of dyspnea and altered mental status.  Patient has been admitted for acute combined respiratory failure in the setting of COPD exacerbation and bronchiectasis.  Pulmonology consultation obtained given his severity of COPD with apparent lack of progression.  Assessment & Plan:   Active Problems:   Essential hypertension   Gastroesophageal reflux disease without esophagitis   Current smoker   COPD exacerbation (HCC)   Acute respiratory failure with hypoxia and hypercapnia (HCC)   Acute metabolic encephalopathy   1-acute respiratory failure with hypoxia/hypercapnia in the setting of COPD exacerbation and bronchiectasis -Patient requiring BiPAP due to increased work of breathing and CO2 retention. -Patient continued to improve slowly; continue current management.  Will transfer to telemetry.  As he has not required BiPAP in 24 hours. -Continue PRN xanax to assist with tachypnea and anxiety symptoms. -Continue IV steroids, scheduled duo nebs, yulperi nebulizer and the use of Brovana and Pulmicort. -Patient will need outpatient follow-up with pulmonologist for repeat of PFTs and further adjustment to maintenance treatment. -Tobacco cessation counseling provided.   -continue BID PPI -Will continue treatment with doxycycline day 5/5-discontinue further doxycycline -Appreciate ongoing pulmonology evaluation as well as palliative discussions -Plan will be to keep comfortable while giving ongoing treatment to see if he is eligible to go home with hospice or if he will need hospice facility   2-Essential hypertension -Pulmonal -Continue current antihypertensive regimen.   3-Gastroesophageal reflux  disease without esophagitis -Continue PPI.   4-Current smoker -Cessation counseling provided -Continue nicotine patch.   5-Acute metabolic encephalopathy -In the setting of respiratory failure with hypercapnia and hypoxia. -Improve mentation back to baseline currently. -Continue supportive care and treatment for COPD exacerbation as mentioned above.   6-history of coronary artery disease -Continue lisinopril, Coreg, statin and aspirin -Patient denies chest pain.     DVT prophylaxis: SCDs Code Status: Full Family Communication: Wife at bedside 9/22 Disposition Plan:  Status is: Inpatient   Remains inpatient appropriate because:IV treatments appropriate due to intensity of illness or inability to take PO and Inpatient level of care appropriate due to severity of illness   Dispo: The patient is from: Home              Anticipated d/c is to: Home versus hospice facility              Patient currently is not medically stable to d/c.              Difficult to place patient No       Skin Assessment:   I have examined the patient's skin and I agree with the wound assessment as performed by the wound care RN as outlined below:   Pressure Injury 01/18/21 Coccyx Medial Unstageable - Full thickness tissue loss in which the base of the injury is covered by slough (yellow, tan, gray, green or brown) and/or eschar (tan, brown or black) in the wound bed. eschar (Active)  01/18/21 2339  Location: Coccyx  Location Orientation: Medial  Staging: Unstageable - Full thickness tissue loss in which the base of the injury is covered by slough (yellow, tan, gray, green or brown) and/or eschar (tan, brown or black) in the wound bed.  Wound Description (  Comments): eschar  Present on Admission: Yes      Consultants:  PCCM   Procedures:  See below  Antimicrobials:  Anti-infectives (From admission, onward)    Start     Dose/Rate Route Frequency Ordered Stop   01/19/21 1200  doxycycline  (VIBRA-TABS) tablet 100 mg        100 mg Oral Every 12 hours 01/19/21 1110         Subjective: Patient seen and evaluated today with ongoing shortness of breath.  He states that he does not want to be made full comfort measures and would like ongoing treatment.  Wife at bedside however is ready for patient to be hospice care.  Objective: Vitals:   01/25/21 0800 01/25/21 0900 01/25/21 1000 01/25/21 1047  BP:  (!) 157/99 130/83   Pulse: 93 88 88 76  Resp: (!) 28 (!) 31 (!) 42 18  Temp:      TempSrc:      SpO2: 93% 96% 93% 98%  Weight:      Height:        Intake/Output Summary (Last 24 hours) at 01/25/2021 1051 Last data filed at 01/25/2021 0333 Gross per 24 hour  Intake 120 ml  Output 3750 ml  Net -3630 ml   Filed Weights   01/22/21 0429 01/23/21 0440 01/24/21 0430  Weight: 59.8 kg 57.5 kg 58 kg    Examination:  General exam: Appears calm and comfortable  Respiratory system: Diminished bilaterally. Respiratory effort normal.  Ongoing 5 L nasal cannula. Cardiovascular system: S1 & S2 heard, RRR.  Gastrointestinal system: Abdomen is soft Central nervous system: Alert and awake Extremities: No edema Skin: No significant lesions noted Psychiatry: Flat affect.    Data Reviewed: I have personally reviewed following labs and imaging studies  CBC: Recent Labs  Lab 01/18/21 1759 01/19/21 0509 01/20/21 0427 01/25/21 0446  WBC 12.1* 8.7 17.0* 13.6*  NEUTROABS 10.6*  --   --   --   HGB 16.2 15.5 13.6 14.1  HCT 49.5 46.9 42.1 43.5  MCV 115.4* 115.8* 118.9* 116.9*  PLT 411* 364 345 893   Basic Metabolic Panel: Recent Labs  Lab 01/18/21 1759 01/19/21 0509 01/20/21 0427 01/25/21 0446  NA 137 137 138 139  K 4.2 4.4 4.7 4.0  CL 97* 98 98 87*  CO2 32 29 33* 44*  GLUCOSE 118* 115* 131* 138*  BUN 18 24* 27* 26*  CREATININE 0.95 1.15 0.97 0.84  CALCIUM 8.6* 8.2* 8.5* 8.6*  MG  --   --   --  2.2   GFR: Estimated Creatinine Clearance: 66.2 mL/min (by C-G formula  based on SCr of 0.84 mg/dL). Liver Function Tests: Recent Labs  Lab 01/18/21 1759 01/19/21 0509  AST 21 16  ALT 31 26  ALKPHOS 71 62  BILITOT 0.4 0.5  PROT 6.4* 5.6*  ALBUMIN 3.5 3.0*   No results for input(s): LIPASE, AMYLASE in the last 168 hours. No results for input(s): AMMONIA in the last 168 hours. Coagulation Profile: No results for input(s): INR, PROTIME in the last 168 hours. Cardiac Enzymes: No results for input(s): CKTOTAL, CKMB, CKMBINDEX, TROPONINI in the last 168 hours. BNP (last 3 results) No results for input(s): PROBNP in the last 8760 hours. HbA1C: No results for input(s): HGBA1C in the last 72 hours. CBG: Recent Labs  Lab 01/19/21 1635 01/22/21 2111  GLUCAP 163* 183*   Lipid Profile: No results for input(s): CHOL, HDL, LDLCALC, TRIG, CHOLHDL, LDLDIRECT in the last 72 hours. Thyroid  Function Tests: No results for input(s): TSH, T4TOTAL, FREET4, T3FREE, THYROIDAB in the last 72 hours. Anemia Panel: No results for input(s): VITAMINB12, FOLATE, FERRITIN, TIBC, IRON, RETICCTPCT in the last 72 hours. Sepsis Labs: No results for input(s): PROCALCITON, LATICACIDVEN in the last 168 hours.  Recent Results (from the past 240 hour(s))  Resp Panel by RT-PCR (Flu A&B, Covid) Nasopharyngeal Swab     Status: None   Collection Time: 01/18/21  5:54 PM   Specimen: Nasopharyngeal Swab; Nasopharyngeal(NP) swabs in vial transport medium  Result Value Ref Range Status   SARS Coronavirus 2 by RT PCR NEGATIVE NEGATIVE Final    Comment: (NOTE) SARS-CoV-2 target nucleic acids are NOT DETECTED.  The SARS-CoV-2 RNA is generally detectable in upper respiratory specimens during the acute phase of infection. The lowest concentration of SARS-CoV-2 viral copies this assay can detect is 138 copies/mL. A negative result does not preclude SARS-Cov-2 infection and should not be used as the sole basis for treatment or other patient management decisions. A negative result may occur  with  improper specimen collection/handling, submission of specimen other than nasopharyngeal swab, presence of viral mutation(s) within the areas targeted by this assay, and inadequate number of viral copies(<138 copies/mL). A negative result must be combined with clinical observations, patient history, and epidemiological information. The expected result is Negative.  Fact Sheet for Patients:  EntrepreneurPulse.com.au  Fact Sheet for Healthcare Providers:  IncredibleEmployment.be  This test is no t yet approved or cleared by the [place] FDA and  has been authorized for detection and/or diagnosis of SARS-CoV-2 by FDA under an Emergency Use Authorization (EUA). This EUA will remain  in effect (meaning this test can be used) for the duration of the COVID-19 declaration under Section 564(b)(1) of the Act, 21 U.S.C.section 360bbb-3(b)(1), unless the authorization is terminated  or revoked sooner.       Influenza A by PCR NEGATIVE NEGATIVE Final   Influenza B by PCR NEGATIVE NEGATIVE Final    Comment: (NOTE) The Xpert Xpress SARS-CoV-2/FLU/RSV plus assay is intended as an aid in the diagnosis of influenza from Nasopharyngeal swab specimens and should not be used as a sole basis for treatment. Nasal washings and aspirates are unacceptable for Xpert Xpress SARS-CoV-2/FLU/RSV testing.  Fact Sheet for Patients: EntrepreneurPulse.com.au  Fact Sheet for Healthcare Providers: IncredibleEmployment.be  This test is not yet approved or cleared by the [place] FDA and has been authorized for detection and/or diagnosis of SARS-CoV-2 by FDA under an Emergency Use Authorization (EUA). This EUA will remain in effect (meaning this test can be used) for the duration of the COVID-19 declaration under Section 564(b)(1) of the Act, 21 U.S.C. section 360bbb-3(b)(1), unless the authorization is terminated  or revoked.  Performed at [facility], [address] [place], [place] [address]   MRSA Next Gen by PCR, Nasal     Status: None   Collection Time: 01/18/21 11:12 PM   Specimen: Nasal Mucosa; Nasal Swab  Result Value Ref Range Status   MRSA by PCR Next Gen NOT DETECTED NOT DETECTED Final    Comment: (NOTE) The GeneXpert MRSA Assay (FDA approved for NASAL specimens only), is one component of a comprehensive MRSA colonization surveillance program. It is not intended to diagnose MRSA infection nor to guide or monitor treatment for MRSA infections. Test performance is not FDA approved in patients less than 59 years old. Performed at [facility], [address] [place], [place] [address]          Radiology  Studies: DG Chest Port 1 View  Result Date: 01/24/2021 CLINICAL DATA:  Shortness of breath, COPD. EXAM: PORTABLE CHEST 1 VIEW COMPARISON:  01/18/2021. FINDINGS: Trachea is midline. Heart size normal. Thoracic aorta is calcified. Coarsened pulmonary parenchymal appearance bilaterally. No airspace consolidation. Mild basilar interstitial prominence with tiny bilateral pleural effusions. IMPRESSION: 1. Possible mild pulmonary edema superimposed on emphysema. 2. Tiny bilateral pleural effusions. Electronically Signed   By: Lorin Picket M.D.   On: 01/24/2021 14:20        Scheduled Meds:  arformoterol  15 mcg Nebulization BID   aspirin EC  81 mg Oral QHS   atorvastatin  80 mg Oral QHS   budesonide (PULMICORT) nebulizer solution  0.5 mg Nebulization BID   carvedilol  6.25 mg Oral BID WC   Chlorhexidine Gluconate Cloth  6 each Topical Daily   cloNIDine  0.1 mg Oral BID   doxycycline  100 mg Oral Q12H   heparin  5,000 Units Subcutaneous Q8H   hydroxyurea  500 mg Oral Q breakfast   methylPREDNISolone (SOLU-MEDROL) injection  80 mg Intravenous Q12H    morphine injection  2 mg Intravenous Q4H   nicotine  21 mg Transdermal Daily   pantoprazole  40 mg Oral BID AC   revefenacin  175  mcg Nebulization Daily   sucralfate  1 g Oral TID WC & HS   Continuous Infusions:  methocarbamol (ROBAXIN) IV       LOS: 7 days    Time spent: 35 minutes    Tayven Renteria Darleen Crocker, DO [facility]  If 7PM-7AM, please contact night-coverage [email or web address] 01/25/2021, 10:51 AM

## 2021-01-25 NOTE — Progress Notes (Signed)
**Note De-Identified  Obfuscation** Patient removed from BIPAP and placed on 6L HFNC tolerating well. VS WNL.  RT to continue to momonitor.

## 2021-01-26 DIAGNOSIS — J9601 Acute respiratory failure with hypoxia: Secondary | ICD-10-CM | POA: Diagnosis not present

## 2021-01-26 DIAGNOSIS — J9602 Acute respiratory failure with hypercapnia: Secondary | ICD-10-CM | POA: Diagnosis not present

## 2021-01-26 DIAGNOSIS — G9341 Metabolic encephalopathy: Secondary | ICD-10-CM | POA: Diagnosis not present

## 2021-01-26 NOTE — Progress Notes (Signed)
PROGRESS NOTE    Jonathan Rosales  EOF:[id number] DOB: Sep 12, 1950 DOA: 01/18/2021 PCP: Claretta Fraise, MD   Brief Narrative:   Jonathan Rosales  is a 70 y.o. male, with current tobacco use, COPD, anxiety, CKD, coronary artery disease, hypertension, and more presents the ED with a chief complaint of dyspnea and altered mental status.  Patient has been admitted for acute combined respiratory failure in the setting of COPD exacerbation and bronchiectasis.  Pulmonology consultation obtained given his severity of COPD with apparent lack of progression.  He has now been transitioned to comfort care.  Plans are to seek out residential hospice facilities.  Assessment & Plan:   Active Problems:   Essential hypertension   Gastroesophageal reflux disease without esophagitis   Current smoker   COPD exacerbation (HCC)   Acute respiratory failure with hypoxia and hypercapnia (HCC)   Acute metabolic encephalopathy   Acute hypercapnic and hypoxemic respiratory failure in the setting of COPD exacerbation of bronchiectasis  Acute metabolic encephalopathy secondary to above  History of CAD  Essential hypertension  GERD  History of tobacco abuse  Currently transitioned to comfort care.  Awaiting residential hospice facilities.   DVT prophylaxis: SCDs Code Status: Full Family Communication: Wife at bedside 9/23 Disposition Plan:  Status is: Inpatient  Remains inpatient appropriate because:Altered mental status and Unsafe d/c plan  Dispo: The patient is from: Home              Anticipated d/c is to:  Residential hospice              Patient currently is medically stable to d/c.   Difficult to place patient No   Skin Assessment:  I have examined the patient's skin and I agree with the wound assessment as performed by the wound care RN as outlined below:  Pressure Injury 01/18/21 Coccyx Medial Unstageable - Full thickness tissue loss in which the base of the injury is covered by  slough (yellow, tan, gray, green or brown) and/or eschar (tan, brown or black) in the wound bed. eschar (Active)  01/18/21 2339  Location: Coccyx  Location Orientation: Medial  Staging: Unstageable - Full thickness tissue loss in which the base of the injury is covered by slough (yellow, tan, gray, green or brown) and/or eschar (tan, brown or black) in the wound bed.  Wound Description (Comments): eschar  Present on Admission: Yes    Consultants:  PCCM Palliative  Procedures:  See below  Antimicrobials:  Anti-infectives (From admission, onward)    Start     Dose/Rate Route Frequency Ordered Stop   01/19/21 1200  doxycycline (VIBRA-TABS) tablet 100 mg  Status:  Discontinued        100 mg Oral Every 12 hours 01/19/21 1110 01/25/21 1055       Subjective: Patient seen and evaluated today with improvement in overall comfort noted.  No acute overnight events noted.  Objective: Vitals:   01/26/21 0800 01/26/21 0818 01/26/21 0900 01/26/21 1000  BP:  134/79    Pulse: 83 81 82 82  Resp: 20 16 (!) 21 (!) 23  Temp:      TempSrc:      SpO2: 95% 91% 94% 94%  Weight:      Height:        Intake/Output Summary (Last 24 hours) at 01/26/2021 1142 Last data filed at 01/26/2021 0824 Gross per 24 hour  Intake 240 ml  Output 2050 ml  Net -1810 ml   Filed Weights   01/22/21 0429  01/23/21 0440 01/24/21 0430  Weight: 59.8 kg 57.5 kg 58 kg    Examination:  General exam: Appears calm and comfortable  Respiratory system: Clear to auscultation. Respiratory effort normal.  Currently on nasal cannula. Cardiovascular system: S1 & S2 heard, RRR.  Gastrointestinal system: Abdomen is soft Central nervous system: Alert and awake Extremities: No edema Skin: No significant lesions noted Psychiatry: Flat affect.    Data Reviewed: I have personally reviewed following labs and imaging studies  CBC: Recent Labs  Lab 01/20/21 0427 01/25/21 0446  WBC 17.0* 13.6*  HGB 13.6 14.1  HCT 42.1  43.5  MCV 118.9* 116.9*  PLT 345 865   Basic Metabolic Panel: Recent Labs  Lab 01/20/21 0427 01/25/21 0446  NA 138 139  K 4.7 4.0  CL 98 87*  CO2 33* 44*  GLUCOSE 131* 138*  BUN 27* 26*  CREATININE 0.97 0.84  CALCIUM 8.5* 8.6*  MG  --  2.2   GFR: Estimated Creatinine Clearance: 66.2 mL/min (by C-G formula based on SCr of 0.84 mg/dL). Liver Function Tests: No results for input(s): AST, ALT, ALKPHOS, BILITOT, PROT, ALBUMIN in the last 168 hours. No results for input(s): LIPASE, AMYLASE in the last 168 hours. No results for input(s): AMMONIA in the last 168 hours. Coagulation Profile: No results for input(s): INR, PROTIME in the last 168 hours. Cardiac Enzymes: No results for input(s): CKTOTAL, CKMB, CKMBINDEX, TROPONINI in the last 168 hours. BNP (last 3 results) No results for input(s): PROBNP in the last 8760 hours. HbA1C: No results for input(s): HGBA1C in the last 72 hours. CBG: Recent Labs  Lab 01/19/21 1635 01/22/21 2111  GLUCAP 163* 183*   Lipid Profile: No results for input(s): CHOL, HDL, LDLCALC, TRIG, CHOLHDL, LDLDIRECT in the last 72 hours. Thyroid Function Tests: No results for input(s): TSH, T4TOTAL, FREET4, T3FREE, THYROIDAB in the last 72 hours. Anemia Panel: No results for input(s): VITAMINB12, FOLATE, FERRITIN, TIBC, IRON, RETICCTPCT in the last 72 hours. Sepsis Labs: No results for input(s): PROCALCITON, LATICACIDVEN in the last 168 hours.  Recent Results (from the past 240 hour(s))  Resp Panel by RT-PCR (Flu A&B, Covid) Nasopharyngeal Swab     Status: None   Collection Time: 01/18/21  5:54 PM   Specimen: Nasopharyngeal Swab; Nasopharyngeal(NP) swabs in vial transport medium  Result Value Ref Range Status   SARS Coronavirus 2 by RT PCR NEGATIVE NEGATIVE Final    Comment: (NOTE) SARS-CoV-2 target nucleic acids are NOT DETECTED.  The SARS-CoV-2 RNA is generally detectable in upper respiratory specimens during the acute phase of infection. The  lowest concentration of SARS-CoV-2 viral copies this assay can detect is 138 copies/mL. A negative result does not preclude SARS-Cov-2 infection and should not be used as the sole basis for treatment or other patient management decisions. A negative result may occur with  improper specimen collection/handling, submission of specimen other than nasopharyngeal swab, presence of viral mutation(s) within the areas targeted by this assay, and inadequate number of viral copies(<138 copies/mL). A negative result must be combined with clinical observations, patient history, and epidemiological information. The expected result is Negative.  Fact Sheet for Patients:  EntrepreneurPulse.com.au  Fact Sheet for Healthcare Providers:  IncredibleEmployment.be  This test is no t yet approved or cleared by the [place] FDA and  has been authorized for detection and/or diagnosis of SARS-CoV-2 by FDA under an Emergency Use Authorization (EUA). This EUA will remain  in effect (meaning this test can be used) for the duration of the  COVID-19 declaration under Section 564(b)(1) of the Act, 21 U.S.C.section 360bbb-3(b)(1), unless the authorization is terminated  or revoked sooner.       Influenza A by PCR NEGATIVE NEGATIVE Final   Influenza B by PCR NEGATIVE NEGATIVE Final    Comment: (NOTE) The Xpert Xpress SARS-CoV-2/FLU/RSV plus assay is intended as an aid in the diagnosis of influenza from Nasopharyngeal swab specimens and should not be used as a sole basis for treatment. Nasal washings and aspirates are unacceptable for Xpert Xpress SARS-CoV-2/FLU/RSV testing.  Fact Sheet for Patients: EntrepreneurPulse.com.au  Fact Sheet for Healthcare Providers: IncredibleEmployment.be  This test is not yet approved or cleared by the [place] FDA and has been authorized for detection and/or diagnosis of SARS-CoV-2 by FDA under  an Emergency Use Authorization (EUA). This EUA will remain in effect (meaning this test can be used) for the duration of the COVID-19 declaration under Section 564(b)(1) of the Act, 21 U.S.C. section 360bbb-3(b)(1), unless the authorization is terminated or revoked.  Performed at [facility], [address] [place], [place] [address]   MRSA Next Gen by PCR, Nasal     Status: None   Collection Time: 01/18/21 11:12 PM   Specimen: Nasal Mucosa; Nasal Swab  Result Value Ref Range Status   MRSA by PCR Next Gen NOT DETECTED NOT DETECTED Final    Comment: (NOTE) The GeneXpert MRSA Assay (FDA approved for NASAL specimens only), is one component of a comprehensive MRSA colonization surveillance program. It is not intended to diagnose MRSA infection nor to guide or monitor treatment for MRSA infections. Test performance is not FDA approved in patients less than 63 years old. Performed at [facility], [address], [place], [place] [address]          Radiology Studies: No results found.      Scheduled Meds:   morphine injection  2 mg Intravenous Q4H   nicotine  21 mg Transdermal Daily     LOS: 8 days    Time spent: 35 minutes    Jonathan Rosales Jonathan Crocker, DO [facility]  If 7PM-7AM, please contact night-coverage [email or web address] 01/26/2021, 11:42 AM

## 2021-01-26 NOTE — Progress Notes (Signed)
Report called and given to [place], [place] on dept. 286. Pt being transferred to room 308 via bed.

## 2021-01-26 NOTE — Care Management Important Message (Signed)
Important Message  Patient Details  Name: CAMRAN KEADY MRN: [id number] Date of Birth: 08-01-1950   Medicare Important Message Given:  Yes     Tommy Medal 01/26/2021, 4:30 PM

## 2021-01-26 NOTE — TOC Initial Note (Signed)
Transition of Care [facility]) - Initial/Assessment Note    Patient Details  Name: Jonathan Rosales MRN: [id number] Date of Birth: 09-Sep-1950  Transition of Care [facility]) CM/SW Contact:    Shade Flood, LCSW Phone Number: 01/26/2021, 11:32 AM  Clinical Narrative:                  Pt and his wife elected for transition to comfort care yesterday. Per MD, he has spoken with pt's wife this AM and she is requesting referral for residential hospice facility.   Contacted pt's wife and reviewed provider options for Residential Hopsice. Pt's wife states that because it is difficult for her to get to [facility] or [facility], she prefers to keep pt closer to home and requests referral to only [facility].  Referral made to [facility]. There are no beds available at the Hospice Residence at this time. Pt will be placed on the waiting list. Pt's wife updated.  TOC will follow.  Expected Discharge Plan: [place] Barriers to Discharge: Hospice Bed not available   Patient Goals and CMS Choice Patient states their goals for this hospitalization and ongoing recovery are:: residential hospice CMS Medicare.gov Compare Post Acute Care list provided to:: Patient Represenative (must comment) Choice offered to / list presented to : Spouse  Expected Discharge Plan and Services Expected Discharge Plan: [place] In-house Referral: Clinical Social Work   Post Acute Care Choice: Hospice Living arrangements for the past 2 months: [place]                                      Prior Living Arrangements/Services Living arrangements for the past 2 months: Single Family Home Lives with:: Spouse Patient language and need for interpreter reviewed:: Yes Do you feel safe going back to the place where you live?: Yes      Need for Family Participation in Patient Care: Yes (Comment) Care giver support system in place?: Yes (comment)   Criminal  Activity/Legal Involvement Pertinent to Current Situation/Hospitalization: No - Comment as needed  Activities of Daily Living Home Assistive Devices/Equipment: CBG Meter ADL Screening (condition at time of admission) Patient's cognitive ability adequate to safely complete daily activities?: Yes Is the patient deaf or have difficulty hearing?: No Does the patient have difficulty seeing, even when wearing glasses/contacts?: No Does the patient have difficulty concentrating, remembering, or making decisions?: No Patient able to express need for assistance with ADLs?: Yes Does the patient have difficulty dressing or bathing?: No Independently performs ADLs?: Yes (appropriate for developmental age) Does the patient have difficulty walking or climbing stairs?: Yes Weakness of Legs: Both Weakness of Arms/Hands: None  Permission Sought/Granted Permission sought to share information with : Facility Art therapist granted to share information with : Yes, Verbal Permission Granted     Permission granted to share info w AGENCY: [facility] -         Emotional Assessment       Orientation: : Oriented to Self Alcohol / Substance Use: Not Applicable Psych Involvement: No (comment)  Admission diagnosis:  COPD exacerbation ([place]) [J44.1] Acute respiratory failure with hypoxia and hypercapnia ([place]) [J96.01, J96.02] Patient Active Problem List   Diagnosis Date Noted   COPD exacerbation ([place]) 01/18/2021   Acute respiratory failure with hypoxia and hypercapnia ([place]) 81/19/1478   Acute metabolic encephalopathy 29/56/2130   Current smoker 10/25/2019   Macrocytosis without anemia  12/15/2017   Essential thrombocytosis ([place]) 12/15/2017   CKD (chronic kidney disease), stage III ([place]) 11/17/2017   Coronary artery disease involving native heart without angina pectoris 11/17/2017   Essential hypertension 11/17/2017   Chronic obstructive pulmonary disease ([place]) 11/17/2017    History of ulcer disease 11/17/2017   Gastroesophageal reflux disease without esophagitis 11/17/2017   PCP:  Claretta Fraise, MD Pharmacy:   [facility], [place] [place] [address] Phone: [phone] Fax: [phone]     Social Determinants of Health (SDOH) Interventions    Readmission Risk Interventions No flowsheet data found.

## 2021-01-27 DIAGNOSIS — G9341 Metabolic encephalopathy: Secondary | ICD-10-CM | POA: Diagnosis not present

## 2021-01-27 MED ORDER — ALBUTEROL SULFATE (2.5 MG/3ML) 0.083% IN NEBU: 2.5000 mg | INHALATION_SOLUTION | Freq: Four times a day (QID) | RESPIRATORY_TRACT | Status: DC | PRN

## 2021-01-27 NOTE — TOC Progression Note (Signed)
Transition of Care [facility]) - Progression Note    Patient Details  Name: Jonathan Rosales MRN: [id number] Date of Birth: 02-24-1951  Transition of Care [facility]) CM/SW Contact  Natasha Bence, LCSW Phone Number: 01/27/2021, 1:36 PM  Clinical Narrative:    CSW attempted to call hospice 2x to inquire about bed availability. CSW received no answer. CSW previously notified [facility] would [facility] no weekend beds available. TOC to follow.   Expected Discharge Plan: [place] Barriers to Discharge: Hospice Bed not available  Expected Discharge Plan and Services Expected Discharge Plan: [place] In-house Referral: Clinical Social Work   Post Acute Care Choice: Hospice Living arrangements for the past 2 months: Single Family Home                                       Social Determinants of Health (SDOH) Interventions    Readmission Risk Interventions No flowsheet data found.

## 2021-01-27 NOTE — Progress Notes (Signed)
PROGRESS NOTE    Jonathan Rosales  HGD:[id number] DOB: 09/06/1950 DOA: 01/18/2021 PCP: Claretta Fraise, MD   Brief Narrative:   Jonathan Rosales  is a 70 y.o. male, with current tobacco use, COPD, anxiety, CKD, coronary artery disease, hypertension, and more presents the ED with a chief complaint of dyspnea and altered mental status.  Patient has been admitted for acute combined respiratory failure in the setting of COPD exacerbation and bronchiectasis.  Pulmonology consultation obtained given his severity of COPD with apparent lack of progression.  He has now been transitioned to comfort care.  Plans are to seek out residential hospice facilities.  Assessment & Plan:   Active Problems:   Essential hypertension   Gastroesophageal reflux disease without esophagitis   Current smoker   COPD exacerbation (HCC)   Acute respiratory failure with hypoxia and hypercapnia (HCC)   Acute metabolic encephalopathy   Acute hypercapnic and hypoxemic respiratory failure in the setting of COPD exacerbation of bronchiectasis   Acute metabolic encephalopathy secondary to above   History of CAD   Essential hypertension   GERD   History of tobacco abuse   Currently transitioned to comfort care.  Awaiting residential hospice facilities.     DVT prophylaxis: SCDs Code Status: Full Family Communication: Wife at bedside 9/24 Disposition Plan:  Status is: Inpatient   Remains inpatient appropriate because:Altered mental status and Unsafe d/c plan   Dispo: The patient is from: Home              Anticipated d/c is to:  Residential hospice              Patient currently is medically stable to d/c.              Difficult to place patient No     Skin Assessment:   I have examined the patient's skin and I agree with the wound assessment as performed by the wound care RN as outlined below:   Pressure Injury 01/18/21 Coccyx Medial Unstageable - Full thickness tissue loss in which the base of the  injury is covered by slough (yellow, tan, gray, green or brown) and/or eschar (tan, brown or black) in the wound bed. eschar (Active)  01/18/21 2339  Location: Coccyx  Location Orientation: Medial  Staging: Unstageable - Full thickness tissue loss in which the base of the injury is covered by slough (yellow, tan, gray, green or brown) and/or eschar (tan, brown or black) in the wound bed.  Wound Description (Comments): eschar  Present on Admission: Yes      Consultants:  PCCM Palliative   Procedures:  See below  Antimicrobials:  Anti-infectives (From admission, onward)    Start     Dose/Rate Route Frequency Ordered Stop   01/19/21 1200  doxycycline (VIBRA-TABS) tablet 100 mg  Status:  Discontinued        100 mg Oral Every 12 hours 01/19/21 1110 01/25/21 1055       Subjective: Patient seen and evaluated today with no new acute complaints or concerns. No acute concerns or events noted overnight.  Objective: Vitals:   01/26/21 1214 01/26/21 1300 01/26/21 1733 01/26/21 1935  BP:    (!) 159/101  Pulse: 81 82  90  Resp: 17 17 (!) 25 (!) 24  Temp: (!) 97.4 F (36.3 C)   98.7 F (37.1 C)  TempSrc: Axillary     SpO2: 95% 95%  (!) 87%  Weight:      Height:  Intake/Output Summary (Last 24 hours) at 01/27/2021 1027 Last data filed at 01/27/2021 0154 Gross per 24 hour  Intake 240 ml  Output 1550 ml  Net -1310 ml   Filed Weights   01/22/21 0429 01/23/21 0440 01/24/21 0430  Weight: 59.8 kg 57.5 kg 58 kg    Examination:  General exam: Appears calm and comfortable  Respiratory system: Clear to auscultation. Respiratory effort normal. On [place]. Cardiovascular system: S1 & S2 heard, RRR.  Gastrointestinal system: Abdomen is soft Central nervous system: Alert and awake Extremities: No edema Skin: No significant lesions noted Psychiatry: Flat affect.    Data Reviewed: I have personally reviewed following labs and imaging studies  CBC: Recent Labs  Lab  01/25/21 0446  WBC 13.6*  HGB 14.1  HCT 43.5  MCV 116.9*  PLT 834   Basic Metabolic Panel: Recent Labs  Lab 01/25/21 0446  NA 139  K 4.0  CL 87*  CO2 44*  GLUCOSE 138*  BUN 26*  CREATININE 0.84  CALCIUM 8.6*  MG 2.2   GFR: Estimated Creatinine Clearance: 66.2 mL/min (by C-G formula based on SCr of 0.84 mg/dL). Liver Function Tests: No results for input(s): AST, ALT, ALKPHOS, BILITOT, PROT, ALBUMIN in the last 168 hours. No results for input(s): LIPASE, AMYLASE in the last 168 hours. No results for input(s): AMMONIA in the last 168 hours. Coagulation Profile: No results for input(s): INR, PROTIME in the last 168 hours. Cardiac Enzymes: No results for input(s): CKTOTAL, CKMB, CKMBINDEX, TROPONINI in the last 168 hours. BNP (last 3 results) No results for input(s): PROBNP in the last 8760 hours. HbA1C: No results for input(s): HGBA1C in the last 72 hours. CBG: Recent Labs  Lab 01/22/21 2111  GLUCAP 183*   Lipid Profile: No results for input(s): CHOL, HDL, LDLCALC, TRIG, CHOLHDL, LDLDIRECT in the last 72 hours. Thyroid Function Tests: No results for input(s): TSH, T4TOTAL, FREET4, T3FREE, THYROIDAB in the last 72 hours. Anemia Panel: No results for input(s): VITAMINB12, FOLATE, FERRITIN, TIBC, IRON, RETICCTPCT in the last 72 hours. Sepsis Labs: No results for input(s): PROCALCITON, LATICACIDVEN in the last 168 hours.  Recent Results (from the past 240 hour(s))  Resp Panel by RT-PCR (Flu A&B, Covid) Nasopharyngeal Swab     Status: None   Collection Time: 01/18/21  5:54 PM   Specimen: Nasopharyngeal Swab; Nasopharyngeal(NP) swabs in vial transport medium  Result Value Ref Range Status   SARS Coronavirus 2 by RT PCR NEGATIVE NEGATIVE Final    Comment: (NOTE) SARS-CoV-2 target nucleic acids are NOT DETECTED.  The SARS-CoV-2 RNA is generally detectable in upper respiratory specimens during the acute phase of infection. The lowest concentration of SARS-CoV-2 viral  copies this assay can detect is 138 copies/mL. A negative result does not preclude SARS-Cov-2 infection and should not be used as the sole basis for treatment or other patient management decisions. A negative result may occur with  improper specimen collection/handling, submission of specimen other than nasopharyngeal swab, presence of viral mutation(s) within the areas targeted by this assay, and inadequate number of viral copies(<138 copies/mL). A negative result must be combined with clinical observations, patient history, and epidemiological information. The expected result is Negative.  Fact Sheet for Patients:  EntrepreneurPulse.com.au  Fact Sheet for Healthcare Providers:  IncredibleEmployment.be  This test is no t yet approved or cleared by the [place] FDA and  has been authorized for detection and/or diagnosis of SARS-CoV-2 by FDA under an Emergency Use Authorization (EUA). This EUA will remain  in  effect (meaning this test can be used) for the duration of the COVID-19 declaration under Section 564(b)(1) of the Act, 21 U.S.C.section 360bbb-3(b)(1), unless the authorization is terminated  or revoked sooner.       Influenza A by PCR NEGATIVE NEGATIVE Final   Influenza B by PCR NEGATIVE NEGATIVE Final    Comment: (NOTE) The Xpert Xpress SARS-CoV-2/FLU/RSV plus assay is intended as an aid in the diagnosis of influenza from Nasopharyngeal swab specimens and should not be used as a sole basis for treatment. Nasal washings and aspirates are unacceptable for Xpert Xpress SARS-CoV-2/FLU/RSV testing.  Fact Sheet for Patients: EntrepreneurPulse.com.au  Fact Sheet for Healthcare Providers: IncredibleEmployment.be  This test is not yet approved or cleared by the [place] FDA and has been authorized for detection and/or diagnosis of SARS-CoV-2 by FDA under an Emergency Use Authorization (EUA). This  EUA will remain in effect (meaning this test can be used) for the duration of the COVID-19 declaration under Section 564(b)(1) of the Act, 21 U.S.C. section 360bbb-3(b)(1), unless the authorization is terminated or revoked.  Performed at [facility], [address] [place], [place] [address]   MRSA Next Gen by PCR, Nasal     Status: None   Collection Time: 01/18/21 11:12 PM   Specimen: Nasal Mucosa; Nasal Swab  Result Value Ref Range Status   MRSA by PCR Next Gen NOT DETECTED NOT DETECTED Final    Comment: (NOTE) The GeneXpert MRSA Assay (FDA approved for NASAL specimens only), is one component of a comprehensive MRSA colonization surveillance program. It is not intended to diagnose MRSA infection nor to guide or monitor treatment for MRSA infections. Test performance is not FDA approved in patients less than 16 years old. Performed at [facility], [address] [place], [place] [address]          Radiology Studies: No results found.      Scheduled Meds:   morphine injection  2 mg Intravenous Q4H   nicotine  21 mg Transdermal Daily     LOS: 9 days    Time spent: 35 minutes    Jonathan Ebarb Darleen Crocker, DO [facility]  If 7PM-7AM, please contact night-coverage [email or web address] 01/27/2021, 10:27 AM

## 2021-01-28 DIAGNOSIS — J9601 Acute respiratory failure with hypoxia: Secondary | ICD-10-CM | POA: Diagnosis not present

## 2021-01-28 DIAGNOSIS — J9602 Acute respiratory failure with hypercapnia: Secondary | ICD-10-CM | POA: Diagnosis not present

## 2021-01-28 MED ORDER — FLUTICASONE PROPIONATE 50 MCG/ACT NA SUSP
1.0000 | Freq: Every day | NASAL | Status: DC
  Administered 2021-01-28 – 2021-01-31 (×4): 1 via NASAL
  Filled 2021-01-28: qty 16

## 2021-01-28 NOTE — TOC Progression Note (Signed)
Transition of Care [facility]) - Progression Note    Patient Details  Name: TORI CUPPS MRN: [id number] Date of Birth: [birth year]  Transition of Care [facility]) CM/SW Contact  Natasha Bence, LCSW Phone Number: 01/28/2021, 2:58 PM  Clinical Narrative:    CSW contacted [facility] to inquire about bed availability. [facility] reported no bed currently available. TOC to follow.   Expected Discharge Plan: [place] Barriers to Discharge: Hospice Bed not available  Expected Discharge Plan and Services Expected Discharge Plan: [place] In-house Referral: Clinical Social Work   Post Acute Care Choice: Hospice Living arrangements for the past 2 months: Single Family Home                                       Social Determinants of Health (SDOH) Interventions    Readmission Risk Interventions No flowsheet data found.

## 2021-01-28 NOTE — Progress Notes (Signed)
PROGRESS NOTE    [name]  JJH:[id number] DOB: 12/24/1950 DOA: 01/18/2021 PCP: Claretta Fraise, MD   Brief Narrative:   Jonathan Rosales  is a 70 y.o. male, with current tobacco use, COPD, anxiety, CKD, coronary artery disease, hypertension, and more presents the ED with a chief complaint of dyspnea and altered mental status.  Patient has been admitted for acute combined respiratory failure in the setting of COPD exacerbation and bronchiectasis.  Pulmonology consultation obtained given his severity of COPD with apparent lack of progression.  He has now been transitioned to comfort care.  Plans are to seek out residential hospice facilities.  Assessment & Plan:   Active Problems:   Essential hypertension   Gastroesophageal reflux disease without esophagitis   Current smoker   COPD exacerbation (HCC)   Acute respiratory failure with hypoxia and hypercapnia (HCC)   Acute metabolic encephalopathy   Acute hypercapnic and hypoxemic respiratory failure in the setting of COPD exacerbation of bronchiectasis   Acute metabolic encephalopathy secondary to above   History of CAD   Essential hypertension   GERD   History of tobacco abuse   Currently transitioned to comfort care.  Awaiting residential hospice facilities.     DVT prophylaxis: SCDs Code Status: Full Family Communication: Wife at bedside 9/24 Disposition Plan:  Status is: Inpatient   Remains inpatient appropriate because:Altered mental status and Unsafe d/c plan   Dispo: The patient is from: Home              Anticipated d/c is to:  Residential hospice              Patient currently is medically stable to d/c.              Difficult to place patient No     Skin Assessment:   I have examined the patient's skin and I agree with the wound assessment as performed by the wound care RN as outlined below:   Pressure Injury 01/18/21 Coccyx Medial Unstageable - Full thickness tissue loss in which the base of the  injury is covered by slough (yellow, tan, gray, green or brown) and/or eschar (tan, brown or black) in the wound bed. eschar (Active)  01/18/21 2339  Location: Coccyx  Location Orientation: Medial  Staging: Unstageable - Full thickness tissue loss in which the base of the injury is covered by slough (yellow, tan, gray, green or brown) and/or eschar (tan, brown or black) in the wound bed.  Wound Description (Comments): eschar  Present on Admission: Yes      Consultants:  PCCM Palliative   Procedures:  See below  Antimicrobials:  Anti-infectives (From admission, onward)    Start     Dose/Rate Route Frequency Ordered Stop   01/19/21 1200  doxycycline (VIBRA-TABS) tablet 100 mg  Status:  Discontinued        100 mg Oral Every 12 hours 01/19/21 1110 01/25/21 1055       Subjective: Patient seen and evaluated today with no new acute complaints or concerns. No acute concerns or events noted overnight.  Objective: Vitals:   01/27/21 1520 01/27/21 2037 01/28/21 0536 01/28/21 0920  BP: (!) 159/108 (!) 150/94 (!) 158/100   Pulse: 94 88 85   Resp: (!) 22 20  (!) 37  Temp: 98.4 F (36.9 C) 97.9 F (36.6 C) 98 F (36.7 C)   TempSrc: Oral  Oral   SpO2: 91% 96% 95% 93%  Weight:      Height:  Intake/Output Summary (Last 24 hours) at 01/28/2021 1042 Last data filed at 01/28/2021 0700 Gross per 24 hour  Intake 1600 ml  Output 1850 ml  Net -250 ml   Filed Weights   01/22/21 0429 01/23/21 0440 01/24/21 0430  Weight: 59.8 kg 57.5 kg 58 kg    Examination:  General exam: Appears calm and comfortable  Respiratory system: Clear to auscultation. Respiratory effort normal. Currently on [place]. Cardiovascular system: S1 & S2 heard, RRR.  Gastrointestinal system: Abdomen is soft Central nervous system: Alert and awake Extremities: No edema Skin: No significant lesions noted Psychiatry: Flat affect.    Data Reviewed: I have personally reviewed following labs and imaging  studies  CBC: Recent Labs  Lab 01/25/21 0446  WBC 13.6*  HGB 14.1  HCT 43.5  MCV 116.9*  PLT 427   Basic Metabolic Panel: Recent Labs  Lab 01/25/21 0446  NA 139  K 4.0  CL 87*  CO2 44*  GLUCOSE 138*  BUN 26*  CREATININE 0.84  CALCIUM 8.6*  MG 2.2   GFR: Estimated Creatinine Clearance: 66.2 mL/min (by C-G formula based on SCr of 0.84 mg/dL). Liver Function Tests: No results for input(s): AST, ALT, ALKPHOS, BILITOT, PROT, ALBUMIN in the last 168 hours. No results for input(s): LIPASE, AMYLASE in the last 168 hours. No results for input(s): AMMONIA in the last 168 hours. Coagulation Profile: No results for input(s): INR, PROTIME in the last 168 hours. Cardiac Enzymes: No results for input(s): CKTOTAL, CKMB, CKMBINDEX, TROPONINI in the last 168 hours. BNP (last 3 results) No results for input(s): PROBNP in the last 8760 hours. HbA1C: No results for input(s): HGBA1C in the last 72 hours. CBG: Recent Labs  Lab 01/22/21 2111  GLUCAP 183*   Lipid Profile: No results for input(s): CHOL, HDL, LDLCALC, TRIG, CHOLHDL, LDLDIRECT in the last 72 hours. Thyroid Function Tests: No results for input(s): TSH, T4TOTAL, FREET4, T3FREE, THYROIDAB in the last 72 hours. Anemia Panel: No results for input(s): VITAMINB12, FOLATE, FERRITIN, TIBC, IRON, RETICCTPCT in the last 72 hours. Sepsis Labs: No results for input(s): PROCALCITON, LATICACIDVEN in the last 168 hours.  Recent Results (from the past 240 hour(s))  Resp Panel by RT-PCR (Flu A&B, Covid) Nasopharyngeal Swab     Status: None   Collection Time: 01/18/21  5:54 PM   Specimen: Nasopharyngeal Swab; Nasopharyngeal(NP) swabs in vial transport medium  Result Value Ref Range Status   SARS Coronavirus 2 by RT PCR NEGATIVE NEGATIVE Final    Comment: (NOTE) SARS-CoV-2 target nucleic acids are NOT DETECTED.  The SARS-CoV-2 RNA is generally detectable in upper respiratory specimens during the acute phase of infection. The  lowest concentration of SARS-CoV-2 viral copies this assay can detect is 138 copies/mL. A negative result does not preclude SARS-Cov-2 infection and should not be used as the sole basis for treatment or other patient management decisions. A negative result may occur with  improper specimen collection/handling, submission of specimen other than nasopharyngeal swab, presence of viral mutation(s) within the areas targeted by this assay, and inadequate number of viral copies(<138 copies/mL). A negative result must be combined with clinical observations, patient history, and epidemiological information. The expected result is Negative.  Fact Sheet for Patients:  EntrepreneurPulse.com.au  Fact Sheet for Healthcare Providers:  IncredibleEmployment.be  This test is no t yet approved or cleared by the [place] FDA and  has been authorized for detection and/or diagnosis of SARS-CoV-2 by FDA under an Emergency Use Authorization (EUA). This EUA will remain  in effect (meaning this test can be used) for the duration of the COVID-19 declaration under Section 564(b)(1) of the Act, 21 U.S.C.section 360bbb-3(b)(1), unless the authorization is terminated  or revoked sooner.       Influenza A by PCR NEGATIVE NEGATIVE Final   Influenza B by PCR NEGATIVE NEGATIVE Final    Comment: (NOTE) The Xpert Xpress SARS-CoV-2/FLU/RSV plus assay is intended as an aid in the diagnosis of influenza from Nasopharyngeal swab specimens and should not be used as a sole basis for treatment. Nasal washings and aspirates are unacceptable for Xpert Xpress SARS-CoV-2/FLU/RSV testing.  Fact Sheet for Patients: EntrepreneurPulse.com.au  Fact Sheet for Healthcare Providers: IncredibleEmployment.be  This test is not yet approved or cleared by the [place] FDA and has been authorized for detection and/or diagnosis of SARS-CoV-2 by FDA under  an Emergency Use Authorization (EUA). This EUA will remain in effect (meaning this test can be used) for the duration of the COVID-19 declaration under Section 564(b)(1) of the Act, 21 U.S.C. section 360bbb-3(b)(1), unless the authorization is terminated or revoked.  Performed at [facility], [address], [place], [place] [address]   MRSA Next Gen by PCR, Nasal     Status: None   Collection Time: 01/18/21 11:12 PM   Specimen: Nasal Mucosa; Nasal Swab  Result Value Ref Range Status   MRSA by PCR Next Gen NOT DETECTED NOT DETECTED Final    Comment: (NOTE) The GeneXpert MRSA Assay (FDA approved for NASAL specimens only), is one component of a comprehensive MRSA colonization surveillance program. It is not intended to diagnose MRSA infection nor to guide or monitor treatment for MRSA infections. Test performance is not FDA approved in patients less than 63 years old. Performed at [facility], [address], [place], [place] [address]          Radiology Studies: No results found.      Scheduled Meds:  fluticasone  1 spray Each Nare Daily    morphine injection  2 mg Intravenous Q4H   nicotine  21 mg Transdermal Daily     LOS: 10 days    Time spent: 35 minutes    Rohen Kimes Darleen Crocker, DO [facility]  If 7PM-7AM, please contact night-coverage [email or web address] 01/28/2021, 10:42 AM

## 2021-01-29 DIAGNOSIS — J9601 Acute respiratory failure with hypoxia: Secondary | ICD-10-CM | POA: Diagnosis not present

## 2021-01-29 DIAGNOSIS — J9622 Acute and chronic respiratory failure with hypercapnia: Secondary | ICD-10-CM | POA: Diagnosis not present

## 2021-01-29 DIAGNOSIS — J9602 Acute respiratory failure with hypercapnia: Secondary | ICD-10-CM | POA: Diagnosis not present

## 2021-01-29 DIAGNOSIS — J9621 Acute and chronic respiratory failure with hypoxia: Secondary | ICD-10-CM | POA: Diagnosis not present

## 2021-01-29 DIAGNOSIS — Z515 Encounter for palliative care: Secondary | ICD-10-CM | POA: Diagnosis not present

## 2021-01-29 NOTE — Progress Notes (Signed)
Patient declined morphine. He is comfortable at this time. He declined 12pm due to still comfortable from morning dose of Morphine. Continues to be turned and repositioned. HOB at 45 degrees. Will continue to monitor

## 2021-01-29 NOTE — Progress Notes (Signed)
Palliative: Mr. Eliot is full comfort care.  He is resting quietly in bed.  He appears acutely/chronically ill and frail.  He does not interact with me in any meaningful way.  He is unable to make his basic needs known.  There is no family at bedside at this time.  He does appear comfortable.  Symptom management reviewed, no changes needed.  Attempting residential hospice placement at [facility], hospice of [place].  Unfortunately, no beds available today.  Conference with attending, bedside nursing staff, transition of care team related to patient condition, needs, goals of care, disposition.  Plan: Full comfort care.  Residential hospice at [place], [facility] hospice.  15 minutes Quinn Axe, NP Palliative medicine team Team phone [phone] Greater than 50% of this time was spent counseling and coordinating care related to the above assessment and plan.

## 2021-01-29 NOTE — Progress Notes (Signed)
PROGRESS NOTE    Jonathan Rosales  GMW:[id number] DOB: 20-Jul-1950 DOA: 01/18/2021 PCP: Claretta Fraise, MD   Brief Narrative:   Jonathan Rosales  is a 70 y.o. male, with current tobacco use, COPD, anxiety, CKD, coronary artery disease, hypertension, and more presents the ED with a chief complaint of dyspnea and altered mental status.  Patient has been admitted for acute combined respiratory failure in the setting of COPD exacerbation and bronchiectasis.  Pulmonology consultation obtained given his severity of COPD with apparent lack of progression.  He has now been transitioned to comfort care.  Plans are to seek out residential hospice facilities.  Assessment & Plan:   Active Problems:   Essential hypertension   Gastroesophageal reflux disease without esophagitis   Current smoker   COPD exacerbation (HCC)   Acute respiratory failure with hypoxia and hypercapnia (HCC)   Acute metabolic encephalopathy   Acute hypercapnic and hypoxemic respiratory failure in the setting of COPD exacerbation of bronchiectasis   Acute metabolic encephalopathy secondary to above   History of CAD   Essential hypertension   GERD   History of tobacco abuse   Currently transitioned to comfort care.  Awaiting residential hospice facilities.     DVT prophylaxis: SCDs Code Status: Full Family Communication: Wife on phone 9/26 Disposition Plan:  Status is: Inpatient   Remains inpatient appropriate because:Altered mental status and Unsafe d/c plan   Dispo: The patient is from: Home              Anticipated d/c is to:  Residential hospice              Patient currently is medically stable to d/c.              Difficult to place patient No     Skin Assessment:   I have examined the patient's skin and I agree with the wound assessment as performed by the wound care RN as outlined below:   Pressure Injury 01/18/21 Coccyx Medial Unstageable - Full thickness tissue loss in which the base of the  injury is covered by slough (yellow, tan, gray, green or brown) and/or eschar (tan, brown or black) in the wound bed. eschar (Active)  01/18/21 2339  Location: Coccyx  Location Orientation: Medial  Staging: Unstageable - Full thickness tissue loss in which the base of the injury is covered by slough (yellow, tan, gray, green or brown) and/or eschar (tan, brown or black) in the wound bed.  Wound Description (Comments): eschar  Present on Admission: Yes      Consultants:  PCCM Palliative   Procedures:  See below  Antimicrobials:  Anti-infectives (From admission, onward)    Start     Dose/Rate Route Frequency Ordered Stop   01/19/21 1200  doxycycline (VIBRA-TABS) tablet 100 mg  Status:  Discontinued        100 mg Oral Every 12 hours 01/19/21 1110 01/25/21 1055       Subjective: Patient seen and evaluated today with no new acute complaints or concerns. No acute concerns or events noted overnight.  Objective: Vitals:   01/28/21 0920 01/28/21 2019 01/29/21 0519 01/29/21 0815  BP:  (!) 147/94 (!) 152/100   Pulse:  79 73 92  Resp: (!) 37 18 20 (!) 21  Temp:  98 F (36.7 C) 98.4 F (36.9 C) 98.3 F (36.8 C)  TempSrc:    Oral  SpO2: 93% 91% 99% 92%  Weight:      Height:  Intake/Output Summary (Last 24 hours) at 01/29/2021 1108 Last data filed at 01/29/2021 0900 Gross per 24 hour  Intake 1600 ml  Output 1400 ml  Net 200 ml   Filed Weights   01/22/21 0429 01/23/21 0440 01/24/21 0430  Weight: 59.8 kg 57.5 kg 58 kg    Examination:  General exam: Appears calm and comfortable  Respiratory system: Clear to auscultation. Respiratory effort normal. On [place]. Cardiovascular system: S1 & S2 heard, RRR.  Gastrointestinal system: Abdomen is soft Central nervous system: Alert and awake Extremities: No edema Skin: No significant lesions noted Psychiatry: Flat affect.    Data Reviewed: I have personally reviewed following labs and imaging studies  CBC: Recent Labs   Lab 01/25/21 0446  WBC 13.6*  HGB 14.1  HCT 43.5  MCV 116.9*  PLT 629   Basic Metabolic Panel: Recent Labs  Lab 01/25/21 0446  NA 139  K 4.0  CL 87*  CO2 44*  GLUCOSE 138*  BUN 26*  CREATININE 0.84  CALCIUM 8.6*  MG 2.2   GFR: Estimated Creatinine Clearance: 66.2 mL/min (by C-G formula based on SCr of 0.84 mg/dL). Liver Function Tests: No results for input(s): AST, ALT, ALKPHOS, BILITOT, PROT, ALBUMIN in the last 168 hours. No results for input(s): LIPASE, AMYLASE in the last 168 hours. No results for input(s): AMMONIA in the last 168 hours. Coagulation Profile: No results for input(s): INR, PROTIME in the last 168 hours. Cardiac Enzymes: No results for input(s): CKTOTAL, CKMB, CKMBINDEX, TROPONINI in the last 168 hours. BNP (last 3 results) No results for input(s): PROBNP in the last 8760 hours. HbA1C: No results for input(s): HGBA1C in the last 72 hours. CBG: Recent Labs  Lab 01/22/21 2111  GLUCAP 183*   Lipid Profile: No results for input(s): CHOL, HDL, LDLCALC, TRIG, CHOLHDL, LDLDIRECT in the last 72 hours. Thyroid Function Tests: No results for input(s): TSH, T4TOTAL, FREET4, T3FREE, THYROIDAB in the last 72 hours. Anemia Panel: No results for input(s): VITAMINB12, FOLATE, FERRITIN, TIBC, IRON, RETICCTPCT in the last 72 hours. Sepsis Labs: No results for input(s): PROCALCITON, LATICACIDVEN in the last 168 hours.  No results found for this or any previous visit (from the past 240 hour(s)).       Radiology Studies: No results found.      Scheduled Meds:  fluticasone  1 spray Each Nare Daily    morphine injection  2 mg Intravenous Q4H   nicotine  21 mg Transdermal Daily     LOS: 11 days    Time spent: 35 minutes    Jermell Holeman Darleen Crocker, DO [facility]  If 7PM-7AM, please contact night-coverage [email or web address] 01/29/2021, 11:08 AM

## 2021-01-29 NOTE — Plan of Care (Signed)

## 2021-01-29 NOTE — TOC Progression Note (Signed)
Transition of Care [facility]) - Progression Note    Patient Details  Name: Jonathan Rosales MRN: [id number] Date of Birth: 01/31/1951  Transition of Care [facility]) CM/SW Contact  Shade Flood, LCSW Phone Number: 01/29/2021, 11:04 AM  Clinical Narrative:     Spoke with Cassandra at [facility] and there is no bed availability today. Pt remains on the waiting list. TOC will follow.  Expected Discharge Plan: [place] Barriers to Discharge: Hospice Bed not available  Expected Discharge Plan and Services Expected Discharge Plan: [place] In-house Referral: Clinical Social Work   Post Acute Care Choice: Hospice Living arrangements for the past 2 months: Single Family Home                                       Social Determinants of Health (SDOH) Interventions    Readmission Risk Interventions No flowsheet data found.

## 2021-01-30 DIAGNOSIS — G9341 Metabolic encephalopathy: Secondary | ICD-10-CM | POA: Diagnosis not present

## 2021-01-30 DIAGNOSIS — J9622 Acute and chronic respiratory failure with hypercapnia: Secondary | ICD-10-CM | POA: Diagnosis not present

## 2021-01-30 DIAGNOSIS — J9621 Acute and chronic respiratory failure with hypoxia: Secondary | ICD-10-CM | POA: Diagnosis not present

## 2021-01-30 DIAGNOSIS — Z515 Encounter for palliative care: Secondary | ICD-10-CM | POA: Diagnosis not present

## 2021-01-30 NOTE — Progress Notes (Signed)
Palliative:  Mr. Batterman is sitting up in bed.  He looks at me as I enter, but does not keep eye contact.  He is alert and eating grapes.  He is able to make his basic needs known. His wife, Shauna Hugh, is at bedside.    Mr. Chiriboga remains on comfort care.  He appears comfortable, and is able to express his symptom management needs to nursing staff.  Diane tells me that she is working with [facility] and the hospice team.  No questions at this time.   Conference with bedside nursing staff and TOC related to patient condition, symptom management and disposition.    Plan:  Continue with comfort care.  Seeking residential hospice, [place], [place].   Prognosis:  2 weeks or less would be anticipated.   15 minutes  Quinn Axe, NP Palliative Medicine Team  Team Phone [phone] Greater than 50% of this time was spent counseling and coordinating care related to the above assessment and plan.

## 2021-01-30 NOTE — Progress Notes (Signed)
Patient declined morphine, and wanted something for anxiety , PRN ativan given . Patient request warm blanket , covered him up well and repositioned. Patient remains alert and oriented and able to make needs known at this time. Denies pain or discomfort.

## 2021-01-30 NOTE — Plan of Care (Signed)

## 2021-01-30 NOTE — Progress Notes (Signed)
PROGRESS NOTE    Jonathan Rosales  YIF:[id number] DOB: [birth year] DOA: 01/18/2021 PCP: Claretta Fraise, MD   Brief Narrative:   Jonathan Rosales  is a 70 y.o. male, with current tobacco use, COPD, anxiety, CKD, coronary artery disease, hypertension, and more presents the ED with a chief complaint of dyspnea and altered mental status.  Patient has been admitted for acute combined respiratory failure in the setting of COPD exacerbation and bronchiectasis.  Pulmonology consultation obtained given his severity of COPD with apparent lack of progression.  He has now been transitioned to comfort care.  Plans are to seek out residential hospice facilities. Wife hoping for [facility].  Assessment & Plan:   Active Problems:   Essential hypertension   Gastroesophageal reflux disease without esophagitis   Current smoker   COPD exacerbation (HCC)   Acute respiratory failure with hypoxia and hypercapnia (HCC)   Acute metabolic encephalopathy   Acute hypercapnic and hypoxemic respiratory failure in the setting of COPD exacerbation of bronchiectasis   Acute metabolic encephalopathy secondary to above   History of CAD   Essential hypertension   GERD   History of tobacco abuse   Currently transitioned to comfort care.  Awaiting residential hospice facilities.     DVT prophylaxis: SCDs Code Status: Full Family Communication: Wife at bedside 9/27 Disposition Plan:  Status is: Inpatient   Remains inpatient appropriate because:Altered mental status and Unsafe d/c plan   Dispo: The patient is from: Home              Anticipated d/c is to:  Residential hospice              Patient currently is medically stable to d/c.              Difficult to place patient No     Skin Assessment:   I have examined the patient's skin and I agree with the wound assessment as performed by the wound care RN as outlined below:   Pressure Injury 01/18/21 Coccyx Medial Unstageable - Full thickness tissue  loss in which the base of the injury is covered by slough (yellow, tan, gray, green or brown) and/or eschar (tan, brown or black) in the wound bed. eschar (Active)  01/18/21 2339  Location: Coccyx  Location Orientation: Medial  Staging: Unstageable - Full thickness tissue loss in which the base of the injury is covered by slough (yellow, tan, gray, green or brown) and/or eschar (tan, brown or black) in the wound bed.  Wound Description (Comments): eschar  Present on Admission: Yes      Consultants:  PCCM Palliative   Procedures:  See below  Antimicrobials:  Anti-infectives (From admission, onward)    Start     Dose/Rate Route Frequency Ordered Stop   01/19/21 1200  doxycycline (VIBRA-TABS) tablet 100 mg  Status:  Discontinued        100 mg Oral Every 12 hours 01/19/21 1110 01/25/21 1055       Subjective: Patient seen and evaluated today with no new acute complaints or concerns. No acute concerns or events noted overnight.  Objective: Vitals:   01/29/21 0519 01/29/21 0815 01/29/21 1410 01/29/21 2033  BP: (!) 152/100  (!) 143/105 (!) 138/95  Pulse: 73 92 79 69  Resp: 20 (!) 21 19 19   Temp: 98.4 F (36.9 C) 98.3 F (36.8 C) (!) 97.5 F (36.4 C) 97.7 F (36.5 C)  TempSrc:  Oral Oral Oral  SpO2: 99% 92% 94% (!) 85%  Weight:  Height:        Intake/Output Summary (Last 24 hours) at 01/30/2021 1325 Last data filed at 01/30/2021 0900 Gross per 24 hour  Intake 480 ml  Output 650 ml  Net -170 ml   Filed Weights   01/22/21 0429 01/23/21 0440 01/24/21 0430  Weight: 59.8 kg 57.5 kg 58 kg    Examination:  General exam: Appears calm and comfortable  Respiratory system: Clear to auscultation. Respiratory effort normal. On [place]. Cardiovascular system: S1 & S2 heard, RRR.  Gastrointestinal system: Abdomen is soft Central nervous system: Alert and awake Extremities: No edema Skin: No significant lesions noted Psychiatry: Flat affect.    Data Reviewed: I have  personally reviewed following labs and imaging studies  CBC: Recent Labs  Lab 01/25/21 0446  WBC 13.6*  HGB 14.1  HCT 43.5  MCV 116.9*  PLT 616   Basic Metabolic Panel: Recent Labs  Lab 01/25/21 0446  NA 139  K 4.0  CL 87*  CO2 44*  GLUCOSE 138*  BUN 26*  CREATININE 0.84  CALCIUM 8.6*  MG 2.2   GFR: Estimated Creatinine Clearance: 66.2 mL/min (by C-G formula based on SCr of 0.84 mg/dL). Liver Function Tests: No results for input(s): AST, ALT, ALKPHOS, BILITOT, PROT, ALBUMIN in the last 168 hours. No results for input(s): LIPASE, AMYLASE in the last 168 hours. No results for input(s): AMMONIA in the last 168 hours. Coagulation Profile: No results for input(s): INR, PROTIME in the last 168 hours. Cardiac Enzymes: No results for input(s): CKTOTAL, CKMB, CKMBINDEX, TROPONINI in the last 168 hours. BNP (last 3 results) No results for input(s): PROBNP in the last 8760 hours. HbA1C: No results for input(s): HGBA1C in the last 72 hours. CBG: No results for input(s): GLUCAP in the last 168 hours. Lipid Profile: No results for input(s): CHOL, HDL, LDLCALC, TRIG, CHOLHDL, LDLDIRECT in the last 72 hours. Thyroid Function Tests: No results for input(s): TSH, T4TOTAL, FREET4, T3FREE, THYROIDAB in the last 72 hours. Anemia Panel: No results for input(s): VITAMINB12, FOLATE, FERRITIN, TIBC, IRON, RETICCTPCT in the last 72 hours. Sepsis Labs: No results for input(s): PROCALCITON, LATICACIDVEN in the last 168 hours.  No results found for this or any previous visit (from the past 240 hour(s)).       Radiology Studies: No results found.      Scheduled Meds:  fluticasone  1 spray Each Nare Daily    morphine injection  2 mg Intravenous Q4H   nicotine  21 mg Transdermal Daily     LOS: 12 days    Time spent: 35 minutes    Harl Wiechmann Darleen Crocker, DO [facility]  If 7PM-7AM, please contact night-coverage [email or web address] 01/30/2021, 1:25 PM

## 2021-01-31 DIAGNOSIS — F17200 Nicotine dependence, unspecified, uncomplicated: Secondary | ICD-10-CM | POA: Diagnosis not present

## 2021-01-31 DIAGNOSIS — J9601 Acute respiratory failure with hypoxia: Secondary | ICD-10-CM | POA: Diagnosis not present

## 2021-01-31 DIAGNOSIS — G9341 Metabolic encephalopathy: Secondary | ICD-10-CM | POA: Diagnosis not present

## 2021-01-31 DIAGNOSIS — J441 Chronic obstructive pulmonary disease with (acute) exacerbation: Secondary | ICD-10-CM | POA: Diagnosis not present

## 2021-01-31 LAB — RESP PANEL BY RT-PCR (FLU A&B, COVID) ARPGX2
Influenza A by PCR: NEGATIVE
Influenza B by PCR: NEGATIVE
SARS Coronavirus 2 by RT PCR: NEGATIVE

## 2021-01-31 MED ORDER — LORAZEPAM 2 MG/ML IJ SOLN: 0.5000 mg | INTRAMUSCULAR | Status: DC | PRN

## 2021-01-31 MED ORDER — HALOPERIDOL 0.5 MG PO TABS: 0.5000 mg | ORAL_TABLET | ORAL | Status: DC | PRN

## 2021-01-31 MED ORDER — MORPHINE SULFATE (PF) 2 MG/ML IV SOLN: 2.0000 mg | INTRAVENOUS | Status: DC

## 2021-01-31 MED ORDER — NICOTINE 21 MG/24HR TD PT24: 21.0000 mg | MEDICATED_PATCH | Freq: Every day | TRANSDERMAL | Status: DC

## 2021-01-31 MED ORDER — GLYCOPYRROLATE 1 MG PO TABS: 1.0000 mg | ORAL_TABLET | ORAL | Status: AC | PRN

## 2021-01-31 MED ORDER — MORPHINE SULFATE (PF) 2 MG/ML IV SOLN: 1.0000 mg | INTRAVENOUS | Status: DC | PRN

## 2021-01-31 NOTE — Care Management Important Message (Signed)
Important Message  Patient Details  Name: Jonathan Rosales MRN: [id number] Date of Birth: 1951-02-23   Medicare Important Message Given:  Yes     Tommy Medal 01/31/2021, 12:16 PM

## 2021-01-31 NOTE — Progress Notes (Signed)
Palliative: Jonathan Rosales is being transferred to residential hospice, [place], today. [place] form completed and placed on chart.  Plan: Comfort and dignity at end-of-life, residential hospice at [facility], [place].  No charge Quinn Axe, NP Palliative medicine team Team phone [phone] Greater than 50% of this time was spent counseling and coordinating care related to the above assessment and plan.

## 2021-01-31 NOTE — Progress Notes (Signed)
Nsg Discharge Note  Admit Date:  01/18/2021 Discharge date: 01/31/2021   Jonathan Rosales to be D/C'd Home per MD order.  AVS completed.  Copy for chart, and copy for patient signed, and dated. Patient/caregiver able to verbalize understanding.  Discharge Medication: Allergies as of 01/31/2021       Reactions   Duloxetine Hcl Nausea And Vomiting   Other Other (See Comments)        Medication List     STOP taking these medications    aspirin EC 81 MG tablet   atorvastatin 80 MG tablet Commonly known as: LIPITOR   carvedilol 6.25 MG tablet Commonly known as: COREG   clonazePAM 0.5 MG tablet Commonly known as: KLONOPIN   escitalopram 10 MG tablet Commonly known as: LEXAPRO   ferrous sulfate 325 (65 FE) MG tablet Commonly known as: FeroSul   hydroxyurea 500 MG capsule Commonly known as: HYDREA   lisinopril 20 MG tablet Commonly known as: ZESTRIL   loratadine 10 MG tablet Commonly known as: CLARITIN   Stiolto Respimat 2.5-2.5 MCG/ACT Aers Generic drug: Tiotropium Bromide-Olodaterol   sucralfate 1 g tablet Commonly known as: CARAFATE   vitamin B-12 1000 MCG tablet Commonly known as: CYANOCOBALAMIN   zinc sulfate 220 (50 Zn) MG capsule       TAKE these medications    acetaminophen 500 MG tablet Commonly known as: TYLENOL Take 500 mg by mouth daily as needed for headache (pain).   albuterol 108 (90 Base) MCG/ACT inhaler Commonly known as: VENTOLIN HFA INHALE 2 PUFFS EVERY 6 HOURS AS NEEDED FOR WHEEZING OR SHORTNESS OF BREATH What changed: See the new instructions.   fluticasone 50 MCG/ACT nasal spray Commonly known as: FLONASE Place 2 sprays into both nostrils daily as needed (sinus headache).   glycopyrrolate 1 MG tablet Commonly known as: ROBINUL Take 1 tablet (1 mg total) by mouth every 4 (four) hours as needed (excessive secretions).   haloperidol 0.5 MG tablet Commonly known as: HALDOL Take 1 tablet (0.5 mg total) by mouth every 4 (four)  hours as needed for agitation (or delirium).   LORazepam 2 MG/ML injection Commonly known as: ATIVAN Inject 0.25-0.5 mLs (0.5-1 mg total) into the vein every 4 (four) hours as needed for anxiety (WOB).   morphine 2 MG/ML injection Inject 0.5-1 mLs (1-2 mg total) into the vein every 2 (two) hours as needed (increased WOB/RR).   morphine 2 MG/ML injection Inject 1 mL (2 mg total) into the vein every 4 (four) hours.   nicotine 21 mg/24hr patch Commonly known as: NICODERM CQ - dosed in mg/24 hours Place 1 patch (21 mg total) onto the skin daily. Start taking on: February 01, 2021   ondansetron 8 MG disintegrating tablet Commonly known as: ZOFRAN-ODT Take 1 tablet (8 mg total) by mouth every 6 (six) hours as needed for nausea or vomiting.   pantoprazole 40 MG tablet Commonly known as: PROTONIX Take 1 tablet (40 mg total) by mouth 2 (two) times daily.   polyethylene glycol 17 g packet Commonly known as: MiraLax Take 17 g by mouth daily. Dissolve one cap full in solution (water, gatorade, etc.) and administer once cap-full daily. You may titrate up daily by 1 cap-full until the patient is having pudding consistency of stools. After the patient is able to start passing softer stools they will need to be on 1/2 cap-full daily for 2 weeks.               Discharge Care Instructions  (  From admission, onward)           Start     Ordered   01/31/21 0000  Discharge wound care:       Comments: Constant repositioning Barrier therapy interventions.   01/31/21 1219            Discharge Assessment: Vitals:   01/30/21 1349 01/30/21 2101  BP: 124/78 127/82  Pulse: 80 84  Resp:  20  Temp: 97.7 F (36.5 C) (!) 97.4 F (36.3 C)  SpO2: 96%    Skin clean, dry and intact without evidence of skin break down, no evidence of skin tears noted. IV catheter discontinued intact. Site without signs and symptoms of complications - no redness or edema noted at insertion site, patient  denies c/o pain - only slight tenderness at site.  Dressing with slight pressure applied.  D/c Instructions-Education: Discharge instructions given to patient/family with verbalized understanding. D/c education completed with patient/family including follow up instructions, medication list, d/c activities limitations if indicated, with other d/c instructions as indicated by MD - patient able to verbalize understanding, all questions fully answered. Patient instructed to return to ED, call 911, or call MD for any changes in condition.  Patient escorted via [place], and D/C home via private auto.  Clovis Fredrickson, LPN  10/05/930 3:55 PM

## 2021-01-31 NOTE — TOC Transition Note (Signed)
Transition of Care [facility]) - CM/SW Discharge Note   Patient Details  Name: HUTTON PELLICANE MRN: [id number] Date of Birth: 02-24-1951  Transition of Care [facility]) CM/SW Contact:  Salome Arnt, LCSW Phone Number: 01/31/2021, 12:43 PM   Clinical Narrative:  Bed available at [facility] today. Pt's wife has signed consents. D/C summary sent to facility. COVID negative. RN given number to call report. Pt will transport via [facility].      Final next level of care: [place] Barriers to Discharge: Barriers Resolved   Patient Goals and CMS Choice Patient states their goals for this hospitalization and ongoing recovery are:: residential hospice CMS Medicare.gov Compare Post Acute Care list provided to:: Patient Represenative (must comment) Choice offered to / list presented to : Spouse  Discharge Placement                Patient to be transferred to facility by: [facility] Name of family member notified: Wife Patient and family notified of of transfer: 01/31/21  Discharge Plan and Services In-house Referral: Clinical Social Work   Post Acute Care Choice: Hospice                               Social Determinants of Health (SDOH) Interventions     Readmission Risk Interventions No flowsheet data found.

## 2021-01-31 NOTE — Discharge Summary (Signed)
Physician Discharge Summary  Jonathan Rosales EHU:[id number] DOB: Aug 28, 1950 DOA: 01/18/2021  PCP: Claretta Fraise, MD  Admit date: 01/18/2021 Discharge date: 01/31/2021  Time spent: 35 minutes  Recommendations for Outpatient Follow-up:  For comfort care   Discharge Diagnoses:  Active Problems:   Essential hypertension   Gastroesophageal reflux disease without esophagitis   Current smoker   COPD exacerbation ([place])   Acute respiratory failure with hypoxia and hypercapnia (HCC)   Acute metabolic encephalopathy   Discharge Condition: No acute distress; patient stable and discharged to [facility] for further comfort care and end-of-life management.  Diet recommendation: Regular diet/comfort feeding.  Filed Weights   01/22/21 0429 01/23/21 0440 01/24/21 0430  Weight: 59.8 kg 57.5 kg 58 kg    History of present illness:  As per H&P written by Dr. Clearence Ped on 01/18/21 Jonathan Rosales  is a 70 y.o. male, with current tobacco use, COPD, anxiety, CKD, coronary artery disease, hypertension, and more presents the ED with a chief complaint of dyspnea and altered mental status.  Patient is quite fatigued and on the BiPAP.  He awakens to voice, and tries to answer questions but is still altered.  He reports he is not sure why he is here.  He is oriented x3.  He reports he is not in any pain.  Chart review reveals that EMS had been called out to the house for shortness of breath.  At that time they put him on a couple liters of oxygen, he started feeling better and he decided not to be transported to the hospital.  He was then noted to have decreased level of consciousness per family, so EMS was called again.  When they arrived the second time his oxygen saturations were in the 70s.  They put patient on nonrebreather and he improved to 100%.  Upon arriving to the ED he was able to be weaned down to 4 L, but was still very somnolent.  VBG was done that showed CO2 retention so patient was started  on BiPAP.  Patient was given steroids and breathing treatments.  Chest x-ray showed no acute disease but did show COPD/emphysema.  EKG was similar to previous with more motion artifact, and tachycardia.  Patient had a slight leukocytosis at 12.1, hemoglobin stable at 16.2.  Chemistry panel is unremarkable.  Negative COVID.  Admission was requested for further management of COPD exacerbation.  Hospital Course:  1-acute respiratory failure with hypoxia/hypercapnia in the setting of COPD exacerbation and bronchiectasis -despite Major efforts with nebulizer management, steroids, antibiotics and intermittent use of BIPAP; patient condition failed to improved; after discussing with palliative care service decision was made to pursue comfort care and residential hospice.  -Continue medications to assist with symptomatic management and comfort care. -Patient will be discharged to [facility] for further end-of-life care.  2-essential hypertension -Focusing on comfort care at this point -Vital signs stable.  3-gastroesophageal reflux disease with esophagitis -Will continue PPI.  4-continue tobacco abuse -Cessation counseling provided. -While inpatient nicotine patch was used.  5-acute metabolic encephalopathy -In the setting of respiratory failure with hypercapnia and hypoxia -Mentation overall improved -Currently focusing on comfort care only.  6-continue current receives -Patient denies chest pain -Transitioning to comfort care only -All medications not intended to provide symptomatic management and comfort will be discontinued at discharge.  Procedures: See below for x-ray reports.  Consultations: Pulmonology service Critical care  Discharge Exam: Vitals:   01/30/21 1349 01/30/21 2101  BP: 124/78 127/82  Pulse: 80 84  Resp:  20  Temp: 97.7 F (36.5 C) (!) 97.4 F (36.3 C)  SpO2: 96%     General: Appears calm and comfortable; concentration on current supplementation.  No  major distress. Cardiovascular: S1 and S2, no rubs, no gallops, no JVD on exam. Respiratory: Clear movement bilaterally; positive scattered rhonchi. Abdomen: Soft, nontender, nondistended, positive bowel sounds Extremities: No cyanosis or clubbing.   Discharge Instructions    Allergies as of 01/31/2021       Reactions   Duloxetine Hcl Nausea And Vomiting   Other Other (See Comments)        Medication List     STOP taking these medications    aspirin EC 81 MG tablet   atorvastatin 80 MG tablet Commonly known as: LIPITOR   carvedilol 6.25 MG tablet Commonly known as: COREG   clonazePAM 0.5 MG tablet Commonly known as: KLONOPIN   escitalopram 10 MG tablet Commonly known as: LEXAPRO   ferrous sulfate 325 (65 FE) MG tablet Commonly known as: FeroSul   hydroxyurea 500 MG capsule Commonly known as: HYDREA   lisinopril 20 MG tablet Commonly known as: ZESTRIL   loratadine 10 MG tablet Commonly known as: CLARITIN   Stiolto Respimat 2.5-2.5 MCG/ACT Aers Generic drug: Tiotropium Bromide-Olodaterol   sucralfate 1 g tablet Commonly known as: CARAFATE   vitamin B-12 1000 MCG tablet Commonly known as: CYANOCOBALAMIN   zinc sulfate 220 (50 Zn) MG capsule       TAKE these medications    acetaminophen 500 MG tablet Commonly known as: TYLENOL Take 500 mg by mouth daily as needed for headache (pain).   albuterol 108 (90 Base) MCG/ACT inhaler Commonly known as: VENTOLIN HFA INHALE 2 PUFFS EVERY 6 HOURS AS NEEDED FOR WHEEZING OR SHORTNESS OF BREATH What changed: See the new instructions.   fluticasone 50 MCG/ACT nasal spray Commonly known as: FLONASE Place 2 sprays into both nostrils daily as needed (sinus headache).   glycopyrrolate 1 MG tablet Commonly known as: ROBINUL Take 1 tablet (1 mg total) by mouth every 4 (four) hours as needed (excessive secretions).   haloperidol 0.5 MG tablet Commonly known as: HALDOL Take 1 tablet (0.5 mg total) by mouth  every 4 (four) hours as needed for agitation (or delirium).   LORazepam 2 MG/ML injection Commonly known as: ATIVAN Inject 0.25-0.5 mLs (0.5-1 mg total) into the vein every 4 (four) hours as needed for anxiety (WOB).   morphine 2 MG/ML injection Inject 0.5-1 mLs (1-2 mg total) into the vein every 2 (two) hours as needed (increased WOB/RR).   morphine 2 MG/ML injection Inject 1 mL (2 mg total) into the vein every 4 (four) hours.   nicotine 21 mg/24hr patch Commonly known as: NICODERM CQ - dosed in mg/24 hours Place 1 patch (21 mg total) onto the skin daily. Start taking on: February 01, 2021   ondansetron 8 MG disintegrating tablet Commonly known as: ZOFRAN-ODT Take 1 tablet (8 mg total) by mouth every 6 (six) hours as needed for nausea or vomiting.   pantoprazole 40 MG tablet Commonly known as: PROTONIX Take 1 tablet (40 mg total) by mouth 2 (two) times daily.   polyethylene glycol 17 g packet Commonly known as: MiraLax Take 17 g by mouth daily. Dissolve one cap full in solution (water, gatorade, etc.) and administer once cap-full daily. You may titrate up daily by 1 cap-full until the patient is having pudding consistency of stools. After the patient is able to start passing softer stools they will  need to be on 1/2 cap-full daily for 2 weeks.               Discharge Care Instructions  (From admission, onward)           Start     Ordered   01/31/21 0000  Discharge wound care:       Comments: Constant repositioning Barrier therapy interventions.   01/31/21 1219           Allergies  Allergen Reactions   Duloxetine Hcl Nausea And Vomiting   Other Other (See Comments)    The results of significant diagnostics from this hospitalization (including imaging, microbiology, ancillary and laboratory) are listed below for reference.    Significant Diagnostic Studies: CT ABDOMEN PELVIS W CONTRAST  Result Date: 01/06/2021 CLINICAL DATA:  Acute nonlocalized  abdominal pain, constipation for 3 days, history hypertension, COPD, smoker EXAM: CT ABDOMEN AND PELVIS WITH CONTRAST TECHNIQUE: Multidetector CT imaging of the abdomen and pelvis was performed using the standard protocol following bolus administration of intravenous contrast. Sagittal and coronal MPR images reconstructed from axial data set. CONTRAST:  78mL OMNIPAQUE IOHEXOL 350 MG/ML SOLN IV. No oral contrast. COMPARISON:  01/26/2018 FINDINGS: Lower chest: Lung bases emphysematous. Calcified granuloma RIGHT lower lobe image 13. Question 3 mm RIGHT lower lobe nodule image 4. Hepatobiliary: Small hepatic cysts. Gallbladder and liver otherwise normal appearance Pancreas: Normal appearance Spleen: Normal appearance. Adrenals/Urinary Tract: LEFT adrenal gland normal appearance. RIGHT adrenal mass 4.0 x 2.3 cm demonstrating significant washout on delayed images consistent with adrenal adenoma. Cyst at mid LEFT kidney 3.6 x 3.4 cm. Cortical scarring at inferior pole RIGHT kidney unchanged. No hydronephrosis or ureteral dilatation. Bladder unremarkable. Stomach/Bowel: Stomach decompressed, wall thickness suboptimally assessed. Stool throughout colon. Appendix not visualized. No definite bowel wall thickening or obstruction. Vascular/Lymphatic: Extensive atherosclerotic calcifications aorta, iliac arteries, visceral arteries. Fusiform aneurysmal dilatation of mid to distal abdominal aorta 4.5 x 4.1 cm image 34 (previously 3.5 x 3.2 cm), aneurysmal segment extending 6.7 cm length into bifurcation. No perianeurysmal hemorrhage. 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 Narrowing of proximal RIGHT common iliac artery and probably of external iliac arteries bilaterally. No adenopathy Reproductive: Mild prostatic enlargement Other: No free air or free fluid.  No hernia. Musculoskeletal: Osseous demineralization. Degenerative disc disease changes lumbar spine. IMPRESSION: Significant increased size of  fusiform mid to distal abdominal aortic aneurysm 4.5 x 4.1 cm (previously 3.5 x 3.2 cm) since 2019 without evidence of rupture/leak. Recommend follow-up CT/MR every 6 months and vascular consultation. This recommendation follows ACR consensus guidelines: White Paper of the ACR Incidental Findings Committee II on Vascular Findings. J Am Coll Radiol 2013; 10:789-794. 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 Narrowing of proximal RIGHT common iliac artery and probably of external iliac arteries bilaterally. RIGHT adrenal adenoma 4.0 x 2.3 cm. Mild prostatic enlargement. No acute intra-abdominal or intrapelvic abnormalities. Question 3 mm RIGHT lower lobe nodule; no follow-up needed if patient is low-risk. Non-contrast chest CT can be considered in 12 months if patient is high-risk. This recommendation follows the consensus statement: Guidelines for Management of Incidental Pulmonary Nodules Detected on CT Images: From the Fleischner Society 2017; Radiology 2017; 284:228-243. Aortic Atherosclerosis (ICD10-I70.0) and Emphysema (ICD10-J43.9). Electronically Signed   By: Lavonia Dana M.D.   On: 01/06/2021 18:36   DG Chest Port 1 View  Result Date: 01/24/2021 CLINICAL DATA:  Shortness of breath, COPD. EXAM: PORTABLE CHEST 1 VIEW COMPARISON:  01/18/2021. FINDINGS: Trachea  is midline. Heart size normal. Thoracic aorta is calcified. Coarsened pulmonary parenchymal appearance bilaterally. No airspace consolidation. Mild basilar interstitial prominence with tiny bilateral pleural effusions. IMPRESSION: 1. Possible mild pulmonary edema superimposed on emphysema. 2. Tiny bilateral pleural effusions. Electronically Signed   By: Lorin Picket M.D.   On: 01/24/2021 14:20   DG Chest Port 1 View  Result Date: 01/18/2021 CLINICAL DATA:  Shortness of breath. Decreased level consciousness. Hypoxia. COPD. Coronary artery disease. EXAM: PORTABLE CHEST 1 VIEW COMPARISON:  09/18/2020 FINDINGS: The heart size  and mediastinal contours are within normal limits. Prior CABG again noted. Pulmonary hyperinflation and diffuse interstitial prominence are stable, consistent with COPD. No evidence of acute pulmonary infiltrate or pleural effusion. IMPRESSION: COPD. No active disease. Electronically Signed   By: Marlaine Hind M.D.   On: 01/18/2021 18:40    Microbiology: No results found for this or any previous visit (from the past 240 hour(s)).   Labs: Basic Metabolic Panel: Recent Labs  Lab 01/25/21 0446  NA 139  K 4.0  CL 87*  CO2 44*  GLUCOSE 138*  BUN 26*  CREATININE 0.84  CALCIUM 8.6*  MG 2.2   CBC: Recent Labs  Lab 01/25/21 0446  WBC 13.6*  HGB 14.1  HCT 43.5  MCV 116.9*  PLT 303    BNP (last 3 results) Recent Labs    01/24/21 1139  BNP 416.0*    Signed:  Barton Dubois MD.  [facility] 01/31/2021, 9:26 AM

## 2021-01-31 NOTE — Progress Notes (Signed)
Report given to Butch Penny at hospice

## 2021-02-05 ENCOUNTER — Telehealth: Payer: Self-pay | Admitting: Family Medicine

## 2021-02-05 ENCOUNTER — Encounter: Payer: Medicare Other | Admitting: Surgery

## 2021-02-05 NOTE — Telephone Encounter (Signed)
Patient is currently at [facility], but is doing well so he going home with Hospice care. Hospice would like to know if Dr. Livia Snellen would be willing to be the supervising physician for patient?  Please call and advise. Thank you.

## 2021-02-06 NOTE — Telephone Encounter (Signed)
Appointment scheduled.

## 2021-02-07 ENCOUNTER — Encounter: Payer: Self-pay | Admitting: Family Medicine

## 2021-02-07 ENCOUNTER — Other Ambulatory Visit: Payer: Self-pay | Admitting: Nurse Practitioner

## 2021-02-07 NOTE — Telephone Encounter (Signed)
Which meds does he need

## 2021-02-14 NOTE — Telephone Encounter (Signed)
Can be his physician

## 2021-02-15 NOTE — Telephone Encounter (Signed)
Jonathan Rosales

## 2021-02-27 ENCOUNTER — Ambulatory Visit (INDEPENDENT_AMBULATORY_CARE_PROVIDER_SITE_OTHER): Payer: Medicare Other | Admitting: Family Medicine

## 2021-02-27 ENCOUNTER — Other Ambulatory Visit: Payer: Self-pay

## 2021-02-27 ENCOUNTER — Encounter: Payer: Self-pay | Admitting: Family Medicine

## 2021-02-27 VITALS — BP 122/69 | HR 93 | Temp 97.6°F | Ht 68.0 in | Wt 116.6 lb

## 2021-02-27 DIAGNOSIS — I10 Essential (primary) hypertension: Secondary | ICD-10-CM | POA: Diagnosis not present

## 2021-02-27 DIAGNOSIS — J432 Centrilobular emphysema: Secondary | ICD-10-CM

## 2021-02-27 DIAGNOSIS — K219 Gastro-esophageal reflux disease without esophagitis: Secondary | ICD-10-CM

## 2021-02-27 MED ORDER — ALBUTEROL SULFATE (2.5 MG/3ML) 0.083% IN NEBU: 2.5000 mg | INHALATION_SOLUTION | Freq: Four times a day (QID) | RESPIRATORY_TRACT | 1 refills | Status: AC | PRN

## 2021-02-27 MED ORDER — TRELEGY ELLIPTA 100-62.5-25 MCG/ACT IN AEPB: 1.0000 | INHALATION_SPRAY | Freq: Every day | RESPIRATORY_TRACT | 11 refills | Status: DC

## 2021-02-27 MED ORDER — CLONAZEPAM 0.5 MG PO TABS: 0.2500 mg | ORAL_TABLET | Freq: Every day | ORAL | 2 refills | Status: DC | PRN

## 2021-02-27 NOTE — Progress Notes (Signed)
Subjective:  Patient ID: Jonathan Rosales, male    DOB: 07-03-1950  Age: 70 y.o. MRN: [id number]  CC: Medical Management of Chronic Issues   's HPI BRANDT CHANEY presents for follow up of COPD DCed from Hospice hoem to his home. Some meds were DCed by Hospice. He has resumed them. He has no maintenance inhaler for his  COPD. He is using O2 at 3 liters with improvement enough to do light exertion in the home.  He could be more mobile if he had a portable concentrator.    presents for  follow-up of hypertension. Patient has no history of headache chest pain or shortness of breath or recent cough. Patient also denies symptoms of TIA such as focal numbness or weakness. Patient denies side effects from medication. States taking it regularly.   Patient in for follow-up of GERD. Currently asymptomatic taking  PPI daily. There is no chest pain or heartburn. No hematemesis and no melena. No dysphagia or choking. Onset is remote. Progression is stable. Complicating factors, none.    Depression screen [facility] 2/9 10/30/2020 09/04/2020 09/04/2020  Decreased Interest 0 0 0  Down, Depressed, Hopeless 0 0 0  PHQ - 2 Score 0 0 0  Altered sleeping - 1 -  Tired, decreased energy - 0 -  Change in appetite - 0 -  Feeling bad or failure about yourself  - 0 -  Trouble concentrating - 0 -  Moving slowly or fidgety/restless - 0 -  Suicidal thoughts - 0 -  PHQ-9 Score - 1 -  Difficult doing work/chores - Not difficult at all -    History D[place]ion has a past medical history of Allergy, Anemia, Anxiety, Blood transfusion without reported diagnosis, Cataract, Chronic kidney disease, COPD (chronic obstructive pulmonary disease) ([place]), Coronary artery disease, COVID, Emphysema of lung ([place]), and Hypertension.   He has a past surgical history that includes Cardiac surgery; Coronary artery bypass graft; and Repair of perforated ulcer (2017).   His family history includes Alcohol abuse in his brother; COPD in his  mother; Depression in his mother; Diabetes in his mother; Heart disease in his father; Kidney disease in his mother.He reports that he has been smoking cigarettes. He has been smoking an average of 1 pack per day. He has never used smokeless tobacco. He reports that he does not drink alcohol and does not use drugs.    ROS Review of Systems  Constitutional: Negative.   HENT: Negative.    Eyes:  Negative for visual disturbance.  Respiratory:  Positive for shortness of breath. Negative for cough.   Cardiovascular:  Negative for chest pain and leg swelling.  Gastrointestinal:  Negative for abdominal pain, diarrhea, nausea and vomiting.  Genitourinary:  Negative for difficulty urinating.  Musculoskeletal:  Negative for arthralgias and myalgias.  Skin:  Negative for rash.  Neurological:  Negative for headaches.  Psychiatric/Behavioral:  Negative for sleep disturbance.    Objective:  BP 122/69   Pulse 93   Temp 97.6 F (36.4 C)   Ht 5\' 8"  (1.727 m)   Wt 116 lb 9.6 oz (52.9 kg)   SpO2 98% Comment: 3L o2  BMI 17.73 kg/m   BP Readings from Last 3 Encounters:  02/27/21 122/69  01/30/21 127/82  01/06/21 (!) 146/92    Wt Readings from Last 3 Encounters:  02/27/21 116 lb 9.6 oz (52.9 kg)  01/24/21 127 lb 13.9 oz (58 kg)  01/06/21 116 lb (52.6 kg)     Physical Exam  Constitutional:      General: He is not in acute distress.    Appearance: He is well-developed.  HENT:     Head: Normocephalic and atraumatic.     Right Ear: External ear normal.     Left Ear: External ear normal.     Nose: Nose normal.  Eyes:     Conjunctiva/sclera: Conjunctivae normal.     Pupils: Pupils are equal, round, and reactive to light.  Cardiovascular:     Rate and Rhythm: Normal rate and regular rhythm.     Heart sounds: Normal heart sounds. No murmur heard. Pulmonary:     Effort: Pulmonary effort is normal. No respiratory distress.     Breath sounds: Normal breath sounds. No wheezing or rales.      Comments: Decreased expiratory phase Abdominal:     Palpations: Abdomen is soft.     Tenderness: There is no abdominal tenderness.  Musculoskeletal:        General: Normal range of motion.     Cervical back: Normal range of motion and neck supple.  Skin:    General: Skin is warm and dry.  Neurological:     Mental Status: He is alert and oriented to person, place, and time.     Deep Tendon Reflexes: Reflexes are normal and symmetric.  Psychiatric:        Behavior: Behavior normal.        Thought Content: Thought content normal.        Judgment: Judgment normal.      Assessment & Plan:   [facility] was seen today for medical management of chronic issues.  Diagnoses and all orders for this visit:  Centrilobular emphysema ([place]) -     For home use only DME oxygen  Essential hypertension  Gastroesophageal reflux disease without esophagitis  Other orders -     clonazePAM (KLONOPIN) 0.5 MG tablet; Take 0.5 tablets (0.25 mg total) by mouth daily as needed for anxiety. -     albuterol (PROVENTIL) (2.5 MG/3ML) 0.083% nebulizer solution; Take 3 mLs (2.5 mg total) by nebulization every 6 (six) hours as needed for wheezing or shortness of breath. -     Fluticasone-Umeclidin-Vilant (TRELEGY ELLIPTA) 100-62.5-25 MCG/ACT AEPB; Inhale 1 puff into the lungs daily.      I have discontinued [place] Pipe. [place]'s morphine, morphine, nicotine, haloperidol, and LORazepam. I have also changed his clonazePAM. Additionally, I am having him start on albuterol and Trelegy Ellipta. Lastly, I am having him maintain his acetaminophen, fluticasone, ondansetron, albuterol, pantoprazole, polyethylene glycol, and glycopyrrolate.  Allergies as of 02/27/2021       Reactions   Duloxetine Hcl Nausea And Vomiting   Other Other (See Comments)        Medication List        Accurate as of February 27, 2021  8:33 PM. If you have any questions, ask your nurse or doctor.          STOP taking these  medications    haloperidol 0.5 MG tablet Commonly known as: HALDOL Stopped by: Claretta Fraise, MD   LORazepam 2 MG/ML injection Commonly known as: ATIVAN Stopped by: Claretta Fraise, MD   morphine 2 MG/ML injection Stopped by: Claretta Fraise, MD   nicotine 21 mg/24hr patch Commonly known as: NICODERM CQ - dosed in mg/24 hours Stopped by: Claretta Fraise, MD       TAKE these medications    acetaminophen 500 MG tablet Commonly known as: TYLENOL Take 500 mg by mouth daily  as needed for headache (pain).   albuterol 108 (90 Base) MCG/ACT inhaler Commonly known as: VENTOLIN HFA INHALE 2 PUFFS EVERY 6 HOURS AS NEEDED FOR WHEEZING OR SHORTNESS OF BREATH What changed: See the new instructions.   albuterol (2.5 MG/3ML) 0.083% nebulizer solution Commonly known as: PROVENTIL Take 3 mLs (2.5 mg total) by nebulization every 6 (six) hours as needed for wheezing or shortness of breath. What changed: You were already taking a medication with the same name, and this prescription was added. Make sure you understand how and when to take each. Changed by: Claretta Fraise, MD   clonazePAM 0.5 MG tablet Commonly known as: KLONOPIN Take 0.5 tablets (0.25 mg total) by mouth daily as needed for anxiety. Started by: Claretta Fraise, MD   fluticasone 50 MCG/ACT nasal spray Commonly known as: FLONASE Place 2 sprays into both nostrils daily as needed (sinus headache).   glycopyrrolate 1 MG tablet Commonly known as: ROBINUL Take 1 tablet (1 mg total) by mouth every 4 (four) hours as needed (excessive secretions).   ondansetron 8 MG disintegrating tablet Commonly known as: ZOFRAN-ODT Take 1 tablet (8 mg total) by mouth every 6 (six) hours as needed for nausea or vomiting.   pantoprazole 40 MG tablet Commonly known as: PROTONIX Take 1 tablet (40 mg total) by mouth 2 (two) times daily.   polyethylene glycol 17 g packet Commonly known as: MiraLax Take 17 g by mouth daily. Dissolve one cap full in  solution (water, gatorade, etc.) and administer once cap-full daily. You may titrate up daily by 1 cap-full until the patient is having pudding consistency of stools. After the patient is able to start passing softer stools they will need to be on 1/2 cap-full daily for 2 weeks.   Trelegy Ellipta 100-62.5-25 MCG/ACT Aepb Generic drug: Fluticasone-Umeclidin-Vilant Inhale 1 puff into the lungs daily. Started by: Claretta Fraise, MD               Durable Medical Equipment  (From admission, onward)           Start     Ordered   02/27/21 0000  For home use only DME oxygen       Comments: Requires portable concentrator Dx: J43.2  Question Answer Comment  Length of Need Lifetime   Mode or (Route) Nasal cannula   Liters per Minute 3   Frequency Continuous (stationary and portable oxygen unit needed)   Oxygen conserving device Yes   Oxygen delivery system Gas      02/27/21 1620             Follow-up: Return in about 6 weeks (around 04/10/2021) for COPD.  Claretta Fraise, M.D.

## 2021-03-12 ENCOUNTER — Ambulatory Visit (INDEPENDENT_AMBULATORY_CARE_PROVIDER_SITE_OTHER): Payer: Medicare Other

## 2021-03-12 VITALS — BP 125/81 | Ht 68.0 in | Wt 125.0 lb

## 2021-03-12 DIAGNOSIS — Z0000 Encounter for general adult medical examination without abnormal findings: Secondary | ICD-10-CM

## 2021-03-12 DIAGNOSIS — R1031 Right lower quadrant pain: Secondary | ICD-10-CM | POA: Insufficient documentation

## 2021-03-12 DIAGNOSIS — R899 Unspecified abnormal finding in specimens from other organs, systems and tissues: Secondary | ICD-10-CM | POA: Insufficient documentation

## 2021-03-12 DIAGNOSIS — K5900 Constipation, unspecified: Secondary | ICD-10-CM | POA: Insufficient documentation

## 2021-03-12 DIAGNOSIS — A0472 Enterocolitis due to Clostridium difficile, not specified as recurrent: Secondary | ICD-10-CM | POA: Insufficient documentation

## 2021-03-12 NOTE — Patient Instructions (Signed)
[name] , Thank you for taking time to come for your Medicare Wellness Visit. I appreciate your ongoing commitment to your health goals. Please review the following plan we discussed and let me know if I can assist you in the future.   Screening recommendations/referrals: Colonoscopy: declined Recommended yearly ophthalmology/optometry visit for glaucoma screening and checkup Recommended yearly dental visit for hygiene and checkup  Vaccinations: Influenza vaccine: Declined Pneumococcal vaccine: Done 03/11/2016 & 05/15/2020 Tdap vaccine: Done 11/28/2014 - Repeat in 10 years  Shingles vaccine: Due   Covid-19: Done Jonathan Rosales 08/16/2019 & Moderna 04/04/2020  Advanced directives: in chart  Conditions/risks identified: Aim for 30 minutes of exercise or brisk walking each day, drink 6-8 glasses of water and eat lots of fruits and vegetables.  Next appointment: Follow up in one year for your annual wellness visit.   Preventive Care 30 Years and Older, Male  Preventive care refers to lifestyle choices and visits with your health care provider that can promote health and wellness. What does preventive care include? A yearly physical exam. This is also called an annual well check. Dental exams once or twice a year. Routine eye exams. Ask your health care provider how often you should have your eyes checked. Personal lifestyle choices, including: Daily care of your teeth and gums. Regular physical activity. Eating a healthy diet. Avoiding tobacco and drug use. Limiting alcohol use. Practicing safe sex. Taking low doses of aspirin every day. Taking vitamin and mineral supplements as recommended by your health care provider. What happens during an annual well check? The services and screenings done by your health care provider during your annual well check will depend on your age, Rosales health, lifestyle risk factors, and family history of disease. Counseling  Your health care provider  may ask you questions about your: Alcohol use. Tobacco use. Drug use. Emotional well-being. Home and relationship well-being. Sexual activity. Eating habits. History of falls. Memory and ability to understand (cognition). Work and work Statistician. Screening  You may have the following tests or measurements: Height, weight, and BMI. Blood pressure. Lipid and cholesterol levels. These may be checked every 5 years, or more frequently if you are over 18 years old. Skin check. Lung cancer screening. You may have this screening every year starting at age 57 if you have a 30-pack-year history of smoking and currently smoke or have quit within the past 15 years. Fecal occult blood test (FOBT) of the stool. You may have this test every year starting at age 12. Flexible sigmoidoscopy or colonoscopy. You may have a sigmoidoscopy every 5 years or a colonoscopy every 10 years starting at age 26. Prostate cancer screening. Recommendations will vary depending on your family history and other risks. Hepatitis C blood test. Hepatitis B blood test. Sexually transmitted disease (STD) testing. Diabetes screening. This is done by checking your blood sugar (glucose) after you have not eaten for a while (fasting). You may have this done every 1-3 years. Abdominal aortic aneurysm (AAA) screening. You may need this if you are a current or former smoker. Osteoporosis. You may be screened starting at age 66 if you are at high risk. Talk with your health care provider about your test results, treatment options, and if necessary, the need for more tests. Vaccines  Your health care provider may recommend certain vaccines, such as: Influenza vaccine. This is recommended every year. Tetanus, diphtheria, and acellular pertussis (Tdap, Td) vaccine. You may need a Td booster every 10 years. Zoster vaccine. You may need  this after age 63. Pneumococcal 13-valent conjugate (PCV13) vaccine. One dose is recommended after  age 50. Pneumococcal polysaccharide (PPSV23) vaccine. One dose is recommended after age 55. Talk to your health care provider about which screenings and vaccines you need and how often you need them. This information is not intended to replace advice given to you by your health care provider. Make sure you discuss any questions you have with your health care provider. Document Released: 05/19/2015 Document Revised: 01/10/2016 Document Reviewed: 02/21/2015 Elsevier Interactive Patient Education  2017 [place] Prevention in the Home Falls can cause injuries. They can happen to people of all ages. There are many things you can do to make your home safe and to help prevent falls. What can I do on the outside of my home? Regularly fix the edges of walkways and driveways and fix any cracks. Remove anything that might make you trip as you walk through a door, such as a raised step or threshold. Trim any bushes or trees on the path to your home. Use bright outdoor lighting. Clear any walking paths of anything that might make someone trip, such as rocks or tools. Regularly check to see if handrails are loose or broken. Make sure that both sides of any steps have handrails. Any raised decks and porches should have guardrails on the edges. Have any leaves, snow, or ice cleared regularly. Use sand or salt on walking paths during winter. Clean up any spills in your garage right away. This includes oil or grease spills. What can I do in the bathroom? Use night lights. Install grab bars by the toilet and in the tub and shower. Do not use towel bars as grab bars. Use non-skid mats or decals in the tub or shower. If you need to sit down in the shower, use a plastic, non-slip stool. Keep the floor dry. Clean up any water that spills on the floor as soon as it happens. Remove soap buildup in the tub or shower regularly. Attach bath mats securely with double-sided non-slip rug tape. Do not have  throw rugs and other things on the floor that can make you trip. What can I do in the bedroom? Use night lights. Make sure that you have a light by your bed that is easy to reach. Do not use any sheets or blankets that are too big for your bed. They should not hang down onto the floor. Have a firm chair that has side arms. You can use this for support while you get dressed. Do not have throw rugs and other things on the floor that can make you trip. What can I do in the kitchen? Clean up any spills right away. Avoid walking on wet floors. Keep items that you use a lot in easy-to-reach places. If you need to reach something above you, use a strong step stool that has a grab bar. Keep electrical cords out of the way. Do not use floor polish or wax that makes floors slippery. If you must use wax, use non-skid floor wax. Do not have throw rugs and other things on the floor that can make you trip. What can I do with my stairs? Do not leave any items on the stairs. Make sure that there are handrails on both sides of the stairs and use them. Fix handrails that are broken or loose. Make sure that handrails are as long as the stairways. Check any carpeting to make sure that it is firmly attached to  the stairs. Fix any carpet that is loose or worn. Avoid having throw rugs at the top or bottom of the stairs. If you do have throw rugs, attach them to the floor with carpet tape. Make sure that you have a light switch at the top of the stairs and the bottom of the stairs. If you do not have them, ask someone to add them for you. What else can I do to help prevent falls? Wear shoes that: Do not have high heels. Have rubber bottoms. Are comfortable and fit you well. Are closed at the toe. Do not wear sandals. If you use a stepladder: Make sure that it is fully opened. Do not climb a closed stepladder. Make sure that both sides of the stepladder are locked into place. Ask someone to hold it for you, if  possible. Clearly mark and make sure that you can see: Any grab bars or handrails. First and last steps. Where the edge of each step is. Use tools that help you move around (mobility aids) if they are needed. These include: Canes. Walkers. Scooters. Crutches. Turn on the lights when you go into a dark area. Replace any light bulbs as soon as they burn out. Set up your furniture so you have a clear path. Avoid moving your furniture around. If any of your floors are uneven, fix them. If there are any pets around you, be aware of where they are. Review your medicines with your doctor. Some medicines can make you feel dizzy. This can increase your chance of falling. Ask your doctor what other things that you can do to help prevent falls. This information is not intended to replace advice given to you by your health care provider. Make sure you discuss any questions you have with your health care provider. Document Released: 02/16/2009 Document Revised: 09/28/2015 Document Reviewed: 05/27/2014 Elsevier Interactive Patient Education  2017 [employer].

## 2021-03-12 NOTE — Progress Notes (Signed)
Subjective:   Jonathan Rosales is a 70 y.o. male who presents for Medicare Annual/Subsequent preventive examination.  Virtual Visit via Telephone Note  I connected with  Jonathan Rosales on 03/12/21 at  2:45 PM EST by telephone and verified that I am speaking with the correct person using two identifiers.  Location: Patient: Home Provider: [facility] Persons participating in the virtual visit: patient/Nurse Health Advisor   I discussed the limitations, risks, security and privacy concerns of performing an evaluation and management service by telephone and the availability of in person appointments. The patient expressed understanding and agreed to proceed.  Interactive audio and video telecommunications were attempted between this nurse and patient, however failed, due to patient having technical difficulties OR patient did not have access to video capability.  We continued and completed visit with audio only.  Some vital signs may be absent or patient reported.   Markiyah Gahm E Aamya Orellana, LPN   Review of Systems     Cardiac Risk Factors include: advanced age (>23men, >33 women);male gender;smoking/ tobacco exposure;sedentary lifestyle;dyslipidemia;Other (see comment), Risk factor comments: COPD, CAD, CHF, Anemia, chronic respiratory failure     Objective:    Today's Vitals   03/12/21 1425  BP: 125/81  Weight: 125 lb (56.7 kg)  Height: 5\' 8"  (1.727 m)   Body mass index is 19.01 kg/m.  Advanced Directives 03/12/2021 01/18/2021 01/18/2021 01/06/2021 09/17/2020 04/19/2019 01/26/2018  Does Patient Have a Medical Advance Directive? Yes No No No No No No  Type of Paramedic of [place];Living will - - - - - -  Copy of [place] in Chart? Yes - validated most recent copy scanned in chart (See row information) - - - - - -  Would patient like information on creating a medical advance directive? - No - Patient declined - No - Patient declined - No - Patient  declined -    Current Medications (verified) Outpatient Encounter Medications as of 03/12/2021  Medication Sig   acetaminophen (TYLENOL) 500 MG tablet Take 500 mg by mouth daily as needed for headache (pain).    albuterol (PROVENTIL) (2.5 MG/3ML) 0.083% nebulizer solution Take 3 mLs (2.5 mg total) by nebulization every 6 (six) hours as needed for wheezing or shortness of breath.   albuterol (VENTOLIN HFA) 108 (90 Base) MCG/ACT inhaler INHALE 2 PUFFS EVERY 6 HOURS AS NEEDED FOR WHEEZING OR SHORTNESS OF BREATH (Patient taking differently: Inhale 2 puffs into the lungs every 6 (six) hours as needed for shortness of breath.)   clonazePAM (KLONOPIN) 0.5 MG tablet Take 0.5 tablets (0.25 mg total) by mouth daily as needed for anxiety.   fluticasone (FLONASE) 50 MCG/ACT nasal spray Place 2 sprays into both nostrils daily as needed (sinus headache).    Fluticasone-Umeclidin-Vilant (TRELEGY ELLIPTA) 100-62.5-25 MCG/ACT AEPB Inhale 1 puff into the lungs daily.   glycopyrrolate (ROBINUL) 1 MG tablet Take 1 tablet (1 mg total) by mouth every 4 (four) hours as needed (excessive secretions).   hydroxyurea (HYDREA) 500 MG capsule Take 500 mg by mouth daily.   ondansetron (ZOFRAN-ODT) 8 MG disintegrating tablet Take 1 tablet (8 mg total) by mouth every 6 (six) hours as needed for nausea or vomiting.   pantoprazole (PROTONIX) 40 MG tablet Take 1 tablet (40 mg total) by mouth 2 (two) times daily.   polyethylene glycol (MIRALAX) 17 g packet Take 17 g by mouth daily. Dissolve one cap full in solution (water, gatorade, etc.) and administer once cap-full daily. You may titrate  up daily by 1 cap-full until the patient is having pudding consistency of stools. After the patient is able to start passing softer stools they will need to be on 1/2 cap-full daily for 2 weeks.   No facility-administered encounter medications on file as of 03/12/2021.    Allergies (verified) Duloxetine hcl and Other   History: Past Medical  History:  Diagnosis Date   Allergy    Anemia    Anxiety    Blood transfusion without reported diagnosis    Cataract    Chronic kidney disease    COPD (chronic obstructive pulmonary disease) (HCC)    Coronary artery disease    COVID    Emphysema of lung ([place])    Hypertension    Past Surgical History:  Procedure Laterality Date   CARDIAC SURGERY     CORONARY ARTERY BYPASS GRAFT     REPAIR OF PERFORATED ULCER  2017   Family History  Problem Relation Age of Onset   COPD Mother    Depression Mother    Diabetes Mother    Kidney disease Mother    Heart disease Father    Alcohol abuse Brother    Social History   Socioeconomic History   Marital status: Married    Spouse name: Not on file   Number of children: 0   Years of education: 12   Highest education level: High school graduate  Occupational History    Employer: [employer]  Tobacco Use   Smoking status: Every Day    Packs/day: 1.00    Types: Cigarettes   Smokeless tobacco: Never  Vaping Use   Vaping Use: Never used  Substance and Sexual Activity   Alcohol use: No   Drug use: No   Sexual activity: Not Currently  Other Topics Concern   Not on file  Social History Narrative   Not on file   Social Determinants of Health   Financial Resource Strain: Low Risk    Difficulty of Paying Living Expenses: Not hard at all  Food Insecurity: No Food Insecurity   Worried About Charity fundraiser in the Last Year: Never true   Ran Out of Food in the Last Year: Never true  Transportation Needs: No Transportation Needs   Lack of Transportation (Medical): No   Lack of Transportation (Non-Medical): No  Physical Activity: Insufficiently Active   Days of Exercise per Week: 7 days   Minutes of Exercise per Session: 20 min  Stress: No Stress Concern Present   Feeling of Stress : Not at all  Social Connections: Socially Integrated   Frequency of Communication with Friends and Family: More than three times a week    Frequency of Social Gatherings with Friends and Family: More than three times a week   Attends Religious Services: More than 4 times per year   Active Member of [employer] or Organizations: Yes   Attends Archivist Meetings: 1 to 4 times per year   Marital Status: Married    Tobacco Counseling Ready to quit: Not Answered Counseling given: Not Answered   Clinical Intake:  Pre-visit preparation completed: Yes  Pain : No/denies pain     BMI - recorded: 19.01 Nutritional Status: BMI of 19-24  Normal Nutritional Risks: None Diabetes: No  How often do you need to have someone help you when you read instructions, pamphlets, or other written materials from your doctor or pharmacy?: 1 - Never  Diabetic? no  Interpreter Needed?: No  Information entered by ::  Caitlen Worth, LPN   Activities of Daily Living In your present state of health, do you have any difficulty performing the following activities: 03/12/2021 01/18/2021  Hearing? Y N  Vision? N N  Difficulty concentrating or making decisions? N N  Walking or climbing stairs? N Y  Dressing or bathing? N N  Doing errands, shopping? N N  Preparing Food and eating ? N -  Using the Toilet? N -  In the past six months, have you accidently leaked urine? N -  Do you have problems with loss of bowel control? N -  Managing your Medications? N -  Managing your Finances? N -  Housekeeping or managing your Housekeeping? N -  Some recent data might be hidden    Patient Care Team: Claretta Fraise, MD as PCP - General (Family Medicine) Harl Bowie Alphonse Guild, MD as PCP - Cardiology (Cardiology)  Indicate any recent Medical Services you may have received from other than [facility] providers in the past year (date may be approximate).     Assessment:   This is a routine wellness examination for Jonathan Rosales.  Hearing/Vision screen Hearing Screening - Comments:: Wears hearing aids Vision Screening - Comments:: Wears rx glasses - no longer sees  eye doctor - used to see Dr Herbert Deaner  Dietary issues and exercise activities discussed: Current Exercise Habits: Home exercise routine, Type of exercise: walking, Time (Minutes): 20, Frequency (Times/Week): 7, Weekly Exercise (Minutes/Week): 140, Intensity: Mild, Exercise limited by: respiratory conditions(s);cardiac condition(s)   Goals Addressed             This Visit's Progress    Exercise 3x per week (30 min per time)         Depression Screen PHQ 2/9 Scores 03/12/2021 10/30/2020 09/04/2020 09/04/2020 05/15/2020 01/24/2020 11/25/2019  PHQ - 2 Score 0 0 0 0 0 0 0  PHQ- 9 Score - - 1 - - - -    Fall Risk Fall Risk  03/12/2021 10/30/2020 09/04/2020 05/15/2020 01/24/2020  Falls in the past year? 0 0 0 0 0  Number falls in past yr: 0 - - 0 -  Injury with Fall? 0 - - 0 -  Risk for fall due to : Medication side effect - - No Fall Risks -  Follow up Falls prevention discussed;Education provided - - Falls evaluation completed Falls evaluation completed    [place]:  Any stairs in or around the home? Yes  If so, are there any without handrails? No  Home free of loose throw rugs in walkways, pet beds, electrical cords, etc? Yes  Adequate lighting in your home to reduce risk of falls? Yes   ASSISTIVE DEVICES UTILIZED TO PREVENT FALLS:  Life alert? No  Use of a cane, walker or w/c? No  Grab bars in the bathroom? Yes  Shower chair or bench in shower? Yes  Elevated toilet seat or a handicapped toilet? Yes   TIMED UP AND GO:  Was the test performed? No . Telephonic visit  Cognitive Function: Normal cognitive status assessed by direct observation by this Nurse Health Advisor. No abnormalities found.       6CIT Screen 03/12/2021 04/19/2019  What Year? 0 points 0 points  What month? 0 points 0 points  What time? 0 points 0 points  Count back from 20 0 points 0 points  Months in reverse 0 points 0 points  Repeat phrase 2 points 0 points  Total Score 2 0     Immunizations  Immunization History  Administered Date(s) Administered   Janssen (J&J) SARS-COV-2 Vaccination 08/16/2019   Moderna Sars-Covid-2 Vaccination 04/04/2020   Pneumococcal Conjugate-13 03/11/2016   Pneumococcal Polysaccharide-23 03/15/2014, 05/15/2020   Pneumococcal-Unspecified 03/15/2014   Tdap 11/28/2014    TDAP status: Up to date  Flu Vaccine status: Declined, Education has been provided regarding the importance of this vaccine but patient still declined. Advised may receive this vaccine at local pharmacy or Health Dept. Aware to provide a copy of the vaccination record if obtained from local pharmacy or Health Dept. Verbalized acceptance and understanding.  Pneumococcal vaccine status: Up to date  Covid-19 vaccine status: Completed vaccines  Qualifies for Shingles Vaccine? Yes   Zostavax completed No   Shingrix Completed?: No.    Education has been provided regarding the importance of this vaccine. Patient has been advised to call insurance company to determine out of pocket expense if they have not yet received this vaccine. Advised may also receive vaccine at local pharmacy or Health Dept. Verbalized acceptance and understanding.  Screening Tests Health Maintenance  Topic Date Due   Zoster Vaccines- Shingrix (1 of 2) Never done   COLONOSCOPY (Pts 45-67yrs Insurance coverage will need to be confirmed)  Never done   COVID-19 Vaccine (3 - Booster for [employer] series) 05/30/2020   INFLUENZA VACCINE  Never done   TETANUS/TDAP  11/27/2024   Pneumonia Vaccine 34+ Years old  Completed   Hepatitis C Screening  Completed   HPV VACCINES  Aged Out    Health Maintenance  Health Maintenance Due  Topic Date Due   Zoster Vaccines- Shingrix (1 of 2) Never done   COLONOSCOPY (Pts 45-51yrs Insurance coverage will need to be confirmed)  Never done   COVID-19 Vaccine (3 - Booster for [employer] series) 05/30/2020   INFLUENZA VACCINE  Never done    Colorectal cancer  screening: No longer required.  He declines  Lung Cancer Screening: (Low Dose CT Chest recommended if Age 82-80 years, 30 pack-year currently smoking OR have quit w/in 15years.) does not qualify.   Additional Screening:  Hepatitis C Screening: does qualify; Completed 10/25/2019  Vision Screening: Recommended annual ophthalmology exams for early detection of glaucoma and other disorders of the eye. Is the patient up to date with their annual eye exam?  No  Who is the provider or what is the name of the office in which the patient attends annual eye exams? [facility] If pt is not established with a provider, would they like to be referred to a provider to establish care? No .   Dental Screening: Recommended annual dental exams for proper oral hygiene  Community Resource Referral / Chronic Care Management: CRR required this visit?  No   CCM required this visit?  No      Plan:     I have personally reviewed and noted the following in the patient's chart:   Medical and social history Use of alcohol, tobacco or illicit drugs  Current medications and supplements including opioid prescriptions. Patient is not currently taking opioid prescriptions. Functional ability and status Nutritional status Physical activity Advanced directives List of other physicians Hospitalizations, surgeries, and ER visits in previous 12 months Vitals Screenings to include cognitive, depression, and falls Referrals and appointments  In addition, I have reviewed and discussed with patient certain preventive protocols, quality metrics, and best practice recommendations. A written personalized care plan for preventive services as well as general preventive health recommendations were provided to patient.     Ricardo Kayes E Dakai Braithwaite,  LPN   [id number]   Nurse Notes: None

## 2021-03-19 DIAGNOSIS — M9901 Segmental and somatic dysfunction of cervical region: Secondary | ICD-10-CM | POA: Diagnosis not present

## 2021-03-19 DIAGNOSIS — M9903 Segmental and somatic dysfunction of lumbar region: Secondary | ICD-10-CM | POA: Diagnosis not present

## 2021-03-19 DIAGNOSIS — M5137 Other intervertebral disc degeneration, lumbosacral region: Secondary | ICD-10-CM | POA: Diagnosis not present

## 2021-03-19 DIAGNOSIS — M9902 Segmental and somatic dysfunction of thoracic region: Secondary | ICD-10-CM | POA: Diagnosis not present

## 2021-04-03 DIAGNOSIS — M9901 Segmental and somatic dysfunction of cervical region: Secondary | ICD-10-CM | POA: Diagnosis not present

## 2021-04-03 DIAGNOSIS — M9902 Segmental and somatic dysfunction of thoracic region: Secondary | ICD-10-CM | POA: Diagnosis not present

## 2021-04-03 DIAGNOSIS — M9903 Segmental and somatic dysfunction of lumbar region: Secondary | ICD-10-CM | POA: Diagnosis not present

## 2021-04-03 DIAGNOSIS — M5137 Other intervertebral disc degeneration, lumbosacral region: Secondary | ICD-10-CM | POA: Diagnosis not present

## 2021-04-10 ENCOUNTER — Ambulatory Visit: Payer: Medicare Other | Admitting: Family Medicine

## 2021-04-11 ENCOUNTER — Ambulatory Visit (INDEPENDENT_AMBULATORY_CARE_PROVIDER_SITE_OTHER): Payer: Medicare Other | Admitting: Family Medicine

## 2021-04-11 ENCOUNTER — Other Ambulatory Visit: Payer: Self-pay

## 2021-04-11 ENCOUNTER — Encounter: Payer: Self-pay | Admitting: Family Medicine

## 2021-04-11 VITALS — BP 112/74 | HR 88 | Temp 97.8°F | Ht 68.0 in | Wt 131.8 lb

## 2021-04-11 DIAGNOSIS — I5022 Chronic systolic (congestive) heart failure: Secondary | ICD-10-CM | POA: Diagnosis not present

## 2021-04-11 DIAGNOSIS — I10 Essential (primary) hypertension: Secondary | ICD-10-CM | POA: Diagnosis not present

## 2021-04-11 MED ORDER — CLONAZEPAM 0.5 MG PO TABS: 0.2500 mg | ORAL_TABLET | Freq: Every day | ORAL | 2 refills | Status: DC | PRN

## 2021-04-11 NOTE — Progress Notes (Signed)
Subjective:  Patient ID: Jonathan Rosales, male    DOB: January 04, 1951  Age: 70 y.o. MRN: [id number]  CC: Follow-up   HPI [name] presents for  follow-up of hypertension. Patient has no history of headache chest pain or shortness of breath or recent cough. Patient also denies symptoms of TIA such as focal numbness or weakness. Patient denies side effects from medication. States taking it regularly.  Pt. Off cigarettes 3 mos. Gaining weight, but now watching diet and cutting back. Feeling better. DOE with moderate exertion.    History Jonathan Rosales has a past medical history of Allergy, Anemia, Anxiety, Blood transfusion without reported diagnosis, Cataract, Chronic kidney disease, COPD (chronic obstructive pulmonary disease) ([place]), Coronary artery disease, COVID, Emphysema of lung ([place]), and Hypertension.   He has a past surgical history that includes Cardiac surgery; Coronary artery bypass graft; and Repair of perforated ulcer (2017).   His family history includes Alcohol abuse in his brother; COPD in his mother; Depression in his mother; Diabetes in his mother; Heart disease in his father; Kidney disease in his mother.He reports that he quit smoking about 3 months ago. His smoking use included cigarettes. He smoked an average of 1 pack per day. He has never used smokeless tobacco. He reports that he does not drink alcohol and does not use drugs.  Current Outpatient Medications on File Prior to Visit  Medication Sig Dispense Refill   acetaminophen (TYLENOL) 500 MG tablet Take 500 mg by mouth daily as needed for headache (pain).      albuterol (PROVENTIL) (2.5 MG/3ML) 0.083% nebulizer solution Take 3 mLs (2.5 mg total) by nebulization every 6 (six) hours as needed for wheezing or shortness of breath. 150 mL 1   albuterol (VENTOLIN HFA) 108 (90 Base) MCG/ACT inhaler INHALE 2 PUFFS EVERY 6 HOURS AS NEEDED FOR WHEEZING OR SHORTNESS OF BREATH (Patient taking differently: Inhale 2 puffs into  the lungs every 6 (six) hours as needed for shortness of breath.) 8.5 g 2   fluticasone (FLONASE) 50 MCG/ACT nasal spray Place 2 sprays into both nostrils daily as needed (sinus headache).      Fluticasone-Umeclidin-Vilant (TRELEGY ELLIPTA) 100-62.5-25 MCG/ACT AEPB Inhale 1 puff into the lungs daily. 60 each 11   glycopyrrolate (ROBINUL) 1 MG tablet Take 1 tablet (1 mg total) by mouth every 4 (four) hours as needed (excessive secretions).     hydroxyurea (HYDREA) 500 MG capsule Take 500 mg by mouth daily.     ondansetron (ZOFRAN-ODT) 8 MG disintegrating tablet Take 1 tablet (8 mg total) by mouth every 6 (six) hours as needed for nausea or vomiting. 20 tablet 1   pantoprazole (PROTONIX) 40 MG tablet Take 1 tablet (40 mg total) by mouth 2 (two) times daily. 180 tablet 3   polyethylene glycol (MIRALAX) 17 g packet Take 17 g by mouth daily. Dissolve one cap full in solution (water, gatorade, etc.) and administer once cap-full daily. You may titrate up daily by 1 cap-full until the patient is having pudding consistency of stools. After the patient is able to start passing softer stools they will need to be on 1/2 cap-full daily for 2 weeks. 14 each 0   No current facility-administered medications on file prior to visit.    ROS Review of Systems  Constitutional:  Positive for fatigue (gaining back strength and energy though). Negative for fever.  Respiratory:  Negative for shortness of breath.   Cardiovascular:  Negative for chest pain.  Musculoskeletal:  Negative for arthralgias.  Skin:  Negative for rash.   Objective:  BP 112/74   Pulse 88   Temp 97.8 F (36.6 C)   Ht 5\' 8"  (1.727 m)   Wt 131 lb 12.8 oz (59.8 kg)   SpO2 93%   BMI 20.04 kg/m   BP Readings from Last 3 Encounters:  04/11/21 112/74  03/12/21 125/81  02/27/21 122/69    Wt Readings from Last 3 Encounters:  04/11/21 131 lb 12.8 oz (59.8 kg)  03/12/21 125 lb (56.7 kg)  02/27/21 116 lb 9.6 oz (52.9 kg)     Physical  Exam Vitals reviewed.  Constitutional:      Appearance: He is well-developed.  HENT:     Head: Normocephalic and atraumatic.     Right Ear: External ear normal.     Left Ear: External ear normal.     Mouth/Throat:     Pharynx: No oropharyngeal exudate or posterior oropharyngeal erythema.  Eyes:     Pupils: Pupils are equal, round, and reactive to light.  Cardiovascular:     Rate and Rhythm: Normal rate and regular rhythm.     Heart sounds: No murmur heard. Pulmonary:     Effort: No respiratory distress.     Breath sounds: Normal breath sounds.  Musculoskeletal:     Cervical back: Normal range of motion and neck supple.  Neurological:     Mental Status: He is alert and oriented to person, place, and time.      Assessment & Plan:   Ty was seen today for follow-up.  Diagnoses and all orders for this visit:  Chronic systolic CHF (congestive heart failure), NYHA class 3 ([place])  Essential hypertension  Other orders -     clonazePAM (KLONOPIN) 0.5 MG tablet; Take 0.5 tablets (0.25 mg total) by mouth daily as needed for anxiety.  Allergies as of 04/11/2021       Reactions   Duloxetine Hcl Nausea And Vomiting   Other Other (See Comments)        Medication List        Accurate as of April 11, 2021  9:13 PM. If you have any questions, ask your nurse or doctor.          acetaminophen 500 MG tablet Commonly known as: TYLENOL Take 500 mg by mouth daily as needed for headache (pain).   albuterol 108 (90 Base) MCG/ACT inhaler Commonly known as: VENTOLIN HFA INHALE 2 PUFFS EVERY 6 HOURS AS NEEDED FOR WHEEZING OR SHORTNESS OF BREATH What changed: See the new instructions.   albuterol (2.5 MG/3ML) 0.083% nebulizer solution Commonly known as: PROVENTIL Take 3 mLs (2.5 mg total) by nebulization every 6 (six) hours as needed for wheezing or shortness of breath. What changed: Another medication with the same name was changed. Make sure you understand how and when to  take each.   clonazePAM 0.5 MG tablet Commonly known as: KLONOPIN Take 0.5 tablets (0.25 mg total) by mouth daily as needed for anxiety.   fluticasone 50 MCG/ACT nasal spray Commonly known as: FLONASE Place 2 sprays into both nostrils daily as needed (sinus headache).   glycopyrrolate 1 MG tablet Commonly known as: ROBINUL Take 1 tablet (1 mg total) by mouth every 4 (four) hours as needed (excessive secretions).   hydroxyurea 500 MG capsule Commonly known as: HYDREA Take 500 mg by mouth daily.   ondansetron 8 MG disintegrating tablet Commonly known as: ZOFRAN-ODT Take 1 tablet (8 mg total) by mouth every 6 (six) hours as needed for  nausea or vomiting.   pantoprazole 40 MG tablet Commonly known as: PROTONIX Take 1 tablet (40 mg total) by mouth 2 (two) times daily.   polyethylene glycol 17 g packet Commonly known as: MiraLax Take 17 g by mouth daily. Dissolve one cap full in solution (water, gatorade, etc.) and administer once cap-full daily. You may titrate up daily by 1 cap-full until the patient is having pudding consistency of stools. After the patient is able to start passing softer stools they will need to be on 1/2 cap-full daily for 2 weeks.   Trelegy Ellipta 100-62.5-25 MCG/ACT Aepb Generic drug: Fluticasone-Umeclidin-Vilant Inhale 1 puff into the lungs daily.        Meds ordered this encounter  Medications   clonazePAM (KLONOPIN) 0.5 MG tablet    Sig: Take 0.5 tablets (0.25 mg total) by mouth daily as needed for anxiety.    Dispense:  30 tablet    Refill:  2      Follow-up: Return in about 3 months (around 07/10/2021).  Claretta Fraise, M.D.

## 2021-04-24 ENCOUNTER — Encounter: Payer: Self-pay | Admitting: Nurse Practitioner

## 2021-04-24 ENCOUNTER — Ambulatory Visit (INDEPENDENT_AMBULATORY_CARE_PROVIDER_SITE_OTHER): Payer: Medicare Other | Admitting: Nurse Practitioner

## 2021-04-24 DIAGNOSIS — U071 COVID-19: Secondary | ICD-10-CM | POA: Diagnosis not present

## 2021-04-24 MED ORDER — NIRMATRELVIR/RITONAVIR (PAXLOVID)TABLET
3.0000 | ORAL_TABLET | Freq: Two times a day (BID) | ORAL | 0 refills | Status: AC
Start: 2021-04-24 — End: 2021-04-29

## 2021-04-24 NOTE — Patient Instructions (Signed)
COVID-19: Quarantine and Isolation °Quarantine °If you were exposed °Quarantine and stay away from others when you have been in close contact with someone who has COVID-19. °Isolate °If you are sick or test positive °Isolate when you are sick or when you have COVID-19, even if you don't have symptoms. °When to stay home °Calculating quarantine °The date of your exposure is considered day 0. Day 1 is the first full day after your last contact with a person who has had COVID-19. Stay home and away from other people for at least 5 days. Learn why CDC updated guidance for the general public. °IF YOU were exposed to COVID-19 and are NOT  °up to dateIF YOU were exposed to COVID-19 and are NOT on COVID-19 vaccinations °Quarantine for at least 5 days °Stay home °Stay home and quarantine for at least 5 full days. °Wear a well-fitting mask if you must be around others in your home. °Do not travel. °Get tested °Even if you don't develop symptoms, get tested at least 5 days after you last had close contact with someone with COVID-19. °After quarantine °Watch for symptoms °Watch for symptoms until 10 days after you last had close contact with someone with COVID-19. °Avoid travel °It is best to avoid travel until a full 10 days after you last had close contact with someone with COVID-19. °If you develop symptoms °Isolate immediately and get tested. Continue to stay home until you know the results. Wear a well-fitting mask around others. °Take precautions until day 10 °Wear a well-fitting mask °Wear a well-fitting mask for 10 full days any time you are around others inside your home or in public. Do not go to places where you are unable to wear a well-fitting mask. °If you must travel during days 6-10, take precautions. °Avoid being around people who are more likely to get very sick from COVID-19. °IF YOU were exposed to COVID-19 and are  °up to dateIF YOU were exposed to COVID-19 and are on COVID-19 vaccinations °No  quarantine °You do not need to stay home unless you develop symptoms. °Get tested °Even if you don't develop symptoms, get tested at least 5 days after you last had close contact with someone with COVID-19. °Watch for symptoms °Watch for symptoms until 10 days after you last had close contact with someone with COVID-19. °If you develop symptoms °Isolate immediately and get tested. Continue to stay home until you know the results. Wear a well-fitting mask around others. °Take precautions until day 10 °Wear a well-fitting mask °Wear a well-fitting mask for 10 full days any time you are around others inside your home or in public. Do not go to places where you are unable to wear a well-fitting mask. °Take precautions if traveling °Avoid being around people who are more likely to get very sick from COVID-19. °IF YOU were exposed to COVID-19 and had confirmed COVID-19 within the past 90 days (you tested positive using a viral test) °No quarantine °You do not need to stay home unless you develop symptoms. °Watch for symptoms °Watch for symptoms until 10 days after you last had close contact with someone with COVID-19. °If you develop symptoms °Isolate immediately and get tested. Continue to stay home until you know the results. Wear a well-fitting mask around others. °Take precautions until day 10 °Wear a well-fitting mask °Wear a well-fitting mask for 10 full days any time you are around others inside your home or in public. Do not go to places where you are   unable to wear a well-fitting mask. °Take precautions if traveling °Avoid being around people who are more likely to get very sick from COVID-19. °Calculating isolation °Day 0 is your first day of symptoms or a positive viral test. Day 1 is the first full day after your symptoms developed or your test specimen was collected. If you have COVID-19 or have symptoms, isolate for at least 5 days. °IF YOU tested positive for COVID-19 or have symptoms, regardless of  vaccination status °Stay home for at least 5 days °Stay home for 5 days and isolate from others in your home. °Wear a well-fitting mask if you must be around others in your home. °Do not travel. °Ending isolation if you had symptoms °End isolation after 5 full days if you are fever-free for 24 hours (without the use of fever-reducing medication) and your symptoms are improving. °Ending isolation if you did NOT have symptoms °End isolation after at least 5 full days after your positive test. °If you got very sick from COVID-19 or have a weakened immune system °You should isolate for at least 10 days. Consult your doctor before ending isolation. °Take precautions until day 10 °Wear a well-fitting mask °Wear a well-fitting mask for 10 full days any time you are around others inside your home or in public. Do not go to places where you are unable to wear a well-fitting mask. °Do not travel °Do not travel until a full 10 days after your symptoms started or the date your positive test was taken if you had no symptoms. °Avoid being around people who are more likely to get very sick from COVID-19. °Definitions °Exposure °Contact with someone infected with SARS-CoV-2, the virus that causes COVID-19, in a way that increases the likelihood of getting infected with the virus. °Close contact °A close contact is someone who was less than 6 feet away from an infected person (laboratory-confirmed or a clinical diagnosis) for a cumulative total of 15 minutes or more over a 24-hour period. For example, three individual 5-minute exposures for a total of 15 minutes. People who are exposed to someone with COVID-19 after they completed at least 5 days of isolation are not considered close contacts. °Quarantine °Quarantine is a strategy used to prevent transmission of COVID-19 by keeping people who have been in close contact with someone with COVID-19 apart from others. °Who does not need to quarantine? °If you had close contact with  someone with COVID-19 and you are in one of the following groups, you do not need to quarantine. °You are up to date with your COVID-19 vaccines. °You had confirmed COVID-19 within the last 90 days (meaning you tested positive using a viral test). °If you are up to date with COVID-19 vaccines, you should wear a well-fitting mask around others for 10 days from the date of your last close contact with someone with COVID-19 (the date of last close contact is considered day 0). Get tested at least 5 days after you last had close contact with someone with COVID-19. If you test positive or develop COVID-19 symptoms, isolate from other people and follow recommendations in the Isolation section below. If you tested positive for COVID-19 with a viral test within the previous 90 days and subsequently recovered and remain without COVID-19 symptoms, you do not need to quarantine or get tested after close contact. You should wear a well-fitting mask around others for 10 days from the date of your last close contact with someone with COVID-19 (the date of last   close contact is considered day 0). If you have COVID-19 symptoms, get tested and isolate from other people and follow recommendations in the Isolation section below. °Who should quarantine? °If you come into close contact with someone with COVID-19, you should quarantine if you are not up to date on COVID-19 vaccines. This includes people who are not vaccinated. °What to do for quarantine °Stay home and away from other people for at least 5 days (day 0 through day 5) after your last contact with a person who has COVID-19. The date of your exposure is considered day 0. Wear a well-fitting mask when around others at home, if possible. °For 10 days after your last close contact with someone with COVID-19, watch for fever (100.4°F or greater), cough, shortness of breath, or other COVID-19 symptoms. °If you develop symptoms, get tested immediately and isolate until you receive  your test results. If you test positive, follow isolation recommendations. °If you do not develop symptoms, get tested at least 5 days after you last had close contact with someone with COVID-19. °If you test negative, you can leave your home, but continue to wear a well-fitting mask when around others at home and in public until 10 days after your last close contact with someone with COVID-19. °If you test positive, you should isolate for at least 5 days from the date of your positive test (if you do not have symptoms). If you do develop COVID-19 symptoms, isolate for at least 5 days from the date your symptoms began (the date the symptoms started is day 0). Follow recommendations in the isolation section below. °If you are unable to get a test 5 days after last close contact with someone with COVID-19, you can leave your home after day 5 if you have been without COVID-19 symptoms throughout the 5-day period. Wear a well-fitting mask for 10 days after your date of last close contact when around others at home and in public. °Avoid people who are have weakened immune systems or are more likely to get very sick from COVID-19, and nursing homes and other high-risk settings, until after at least 10 days. °If possible, stay away from people you live with, especially people who are at higher risk for getting very sick from COVID-19, as well as others outside your home throughout the full 10 days after your last close contact with someone with COVID-19. °If you are unable to quarantine, you should wear a well-fitting mask for 10 days when around others at home and in public. °If you are unable to wear a mask when around others, you should continue to quarantine for 10 days. Avoid people who have weakened immune systems or are more likely to get very sick from COVID-19, and nursing homes and other high-risk settings, until after at least 10 days. °See additional information about travel. °Do not go to places where you are  unable to wear a mask, such as restaurants and some gyms, and avoid eating around others at home and at work until after 10 days after your last close contact with someone with COVID-19. °After quarantine °Watch for symptoms until 10 days after your last close contact with someone with COVID-19. °If you have symptoms, isolate immediately and get tested. °Quarantine in high-risk congregate settings °In certain congregate settings that have high risk of secondary transmission (such as correctional and detention facilities, homeless shelters, or cruise ships), CDC recommends a 10-day quarantine for residents, regardless of vaccination and booster status. During periods of critical staffing   shortages, facilities may consider shortening the quarantine period for staff to ensure continuity of operations. Decisions to shorten quarantine in these settings should be made in consultation with state, local, tribal, or territorial health departments and should take into consideration the context and characteristics of the facility. CDC's setting-specific guidance provides additional recommendations for these settings. °Isolation °Isolation is used to separate people with confirmed or suspected COVID-19 from those without COVID-19. People who are in isolation should stay home until it's safe for them to be around others. At home, anyone sick or infected should separate from others, or wear a well-fitting mask when they need to be around others. People in isolation should stay in a specific "sick room" or area and use a separate bathroom if available. Everyone who has presumed or confirmed COVID-19 should stay home and isolate from other people for at least 5 full days (day 0 is the first day of symptoms or the date of the day of the positive viral test for asymptomatic persons). They should wear a mask when around others at home and in public for an additional 5 days. People who are confirmed to have COVID-19 or are showing  symptoms of COVID-19 need to isolate regardless of their vaccination status. This includes: °People who have a positive viral test for COVID-19, regardless of whether or not they have symptoms. °People with symptoms of COVID-19, including people who are awaiting test results or have not been tested. People with symptoms should isolate even if they do not know if they have been in close contact with someone with COVID-19. °What to do for isolation °Monitor your symptoms. If you have an emergency warning sign (including trouble breathing), seek emergency medical care immediately. °Stay in a separate room from other household members, if possible. °Use a separate bathroom, if possible. °Take steps to improve ventilation at home, if possible. °Avoid contact with other members of the household and pets. °Don't share personal household items, like cups, towels, and utensils. °Wear a well-fitting mask when you need to be around other people. °Learn more about what to do if you are sick and how to notify your contacts. °Ending isolation for people who had COVID-19 and had symptoms °If you had COVID-19 and had symptoms, isolate for at least 5 days. To calculate your 5-day isolation period, day 0 is your first day of symptoms. Day 1 is the first full day after your symptoms developed. You can leave isolation after 5 full days. °You can end isolation after 5 full days if you are fever-free for 24 hours without the use of fever-reducing medication and your other symptoms have improved (Loss of taste and smell may persist for weeks or months after recovery and need not delay the end of isolation). °You should continue to wear a well-fitting mask around others at home and in public for 5 additional days (day 6 through day 10) after the end of your 5-day isolation period. If you are unable to wear a mask when around others, you should continue to isolate for a full 10 days. Avoid people who have weakened immune systems or are more  likely to get very sick from COVID-19, and nursing homes and other high-risk settings, until after at least 10 days. °If you continue to have fever or your other symptoms have not improved after 5 days of isolation, you should wait to end your isolation until you are fever-free for 24 hours without the use of fever-reducing medication and your other symptoms have improved.   Continue to wear a well-fitting mask through day 10. Contact your healthcare provider if you have questions. °See additional information about travel. °Do not go to places where you are unable to wear a mask, such as restaurants and some gyms, and avoid eating around others at home and at work until a full 10 days after your first day of symptoms. °If an individual has access to a test and wants to test, the best approach is to use an antigen test1 towards the end of the 5-day isolation period. Collect the test sample only if you are fever-free for 24 hours without the use of fever-reducing medication and your other symptoms have improved (loss of taste and smell may persist for weeks or months after recovery and need not delay the end of isolation). If your test result is positive, you should continue to isolate until day 10. If your test result is negative, you can end isolation, but continue to wear a well-fitting mask around others at home and in public until day 10. Follow additional recommendations for masking and avoiding travel as described above. °1As noted in the labeling for authorized over-the counter antigen tests: Negative results should be treated as presumptive. Negative results do not rule out SARS-CoV-2 infection and should not be used as the sole basis for treatment or patient management decisions, including infection control decisions. To improve results, antigen tests should be used twice over a three-day period with at least 24 hours and no more than 48 hours between tests. °Note that these recommendations on ending isolation  do not apply to people who are moderately ill or very sick from COVID-19 or have weakened immune systems. See section below for recommendations for when to end isolation for these groups. °Ending isolation for people who tested positive for COVID-19 but had no symptoms °If you test positive for COVID-19 and never develop symptoms, isolate for at least 5 days. Day 0 is the day of your positive viral test (based on the date you were tested) and day 1 is the first full day after the specimen was collected for your positive test. You can leave isolation after 5 full days. °If you continue to have no symptoms, you can end isolation after at least 5 days. °You should continue to wear a well-fitting mask around others at home and in public until day 10 (day 6 through day 10). If you are unable to wear a mask when around others, you should continue to isolate for 10 days. Avoid people who have weakened immune systems or are more likely to get very sick from COVID-19, and nursing homes and other high-risk settings, until after at least 10 days. °If you develop symptoms after testing positive, your 5-day isolation period should start over. Day 0 is your first day of symptoms. Follow the recommendations above for ending isolation for people who had COVID-19 and had symptoms. °See additional information about travel. °Do not go to places where you are unable to wear a mask, such as restaurants and some gyms, and avoid eating around others at home and at work until 10 days after the day of your positive test. °If an individual has access to a test and wants to test, the best approach is to use an antigen test1 towards the end of the 5-day isolation period. If your test result is positive, you should continue to isolate until day 10. If your test result is positive, you can also choose to test daily and if your test result   is negative, you can end isolation, but continue to wear a well-fitting mask around others at home and in  public until day 10. Follow additional recommendations for masking and avoiding travel as described above. °1As noted in the labeling for authorized over-the counter antigen tests: Negative results should be treated as presumptive. Negative results do not rule out SARS-CoV-2 infection and should not be used as the sole basis for treatment or patient management decisions, including infection control decisions. To improve results, antigen tests should be used twice over a three-day period with at least 24 hours and no more than 48 hours between tests. °Ending isolation for people who were moderately or very sick from COVID-19 or have a weakened immune system °People who are moderately ill from COVID-19 (experiencing symptoms that affect the lungs like shortness of breath or difficulty breathing) should isolate for 10 days and follow all other isolation precautions. To calculate your 10-day isolation period, day 0 is your first day of symptoms. Day 1 is the first full day after your symptoms developed. If you are unsure if your symptoms are moderate, talk to a healthcare provider for further guidance. °People who are very sick from COVID-19 (this means people who were hospitalized or required intensive care or ventilation support) and people who have weakened immune systems might need to isolate at home longer. They may also require testing with a viral test to determine when they can be around others. CDC recommends an isolation period of at least 10 and up to 20 days for people who were very sick from COVID-19 and for people with weakened immune systems. Consult with your healthcare provider about when you can resume being around other people. If you are unsure if your symptoms are severe or if you have a weakened immune system, talk to a healthcare provider for further guidance. °People who have a weakened immune system should talk to their healthcare provider about the potential for reduced immune responses to  COVID-19 vaccines and the need to continue to follow current prevention measures (including wearing a well-fitting mask and avoiding crowds and poorly ventilated indoor spaces) to protect themselves against COVID-19 until advised otherwise by their healthcare provider. Close contacts of immunocompromised people--including household members--should also be encouraged to receive all recommended COVID-19 vaccine doses to help protect these people. °Isolation in high-risk congregate settings °In certain high-risk congregate settings that have high risk of secondary transmission and where it is not feasible to cohort people (such as correctional and detention facilities, homeless shelters, and cruise ships), CDC recommends a 10-day isolation period for residents. During periods of critical staffing shortages, facilities may consider shortening the isolation period for staff to ensure continuity of operations. Decisions to shorten isolation in these settings should be made in consultation with state, local, tribal, or territorial health departments and should take into consideration the context and characteristics of the facility. CDC's setting-specific guidance provides additional recommendations for these settings. °This CDC guidance is meant to supplement--not replace--any federal, state, local, territorial, or tribal health and safety laws, rules, and regulations. °Recommendations for specific settings °These recommendations do not apply to healthcare professionals. For guidance specific to these settings, see °Healthcare professionals: Interim Guidance for Managing Healthcare Personnel with SARS-CoV-2 Infection or Exposure to SARS-CoV-2 °Patients, residents, and visitors to healthcare settings: Interim Infection Prevention and Control Recommendations for Healthcare Personnel During the Coronavirus Disease 2019 (COVID-19) Pandemic °Additional setting-specific guidance and recommendations are available. °These  recommendations on quarantine and isolation do apply to K[employer] School   settings. Additional guidance is available here: Overview of COVID-19 Quarantine for K-12 Schools °Travelers: Travel information and recommendations °Congregate facilities and other settings: guidance pages for community, work, and school settings °Ongoing COVID-19 exposure FAQs °I live with someone with COVID-19, but I cannot be separated from them. How do we manage quarantine in this situation? °It is very important for people with COVID-19 to remain apart from other people, if possible, even if they are living together. If separation of the person with COVID-19 from others that they live with is not possible, the other people that they live with will have ongoing exposure, meaning they will be repeatedly exposed until that person is no longer able to spread the virus to other people. In this situation, there are precautions you can take to limit the spread of COVID-19: °The person with COVID-19 and everyone they live with should wear a well-fitting mask inside the home. °If possible, one person should care for the person with COVID-19 to limit the number of people who are in close contact with the infected person. °Take steps to protect yourself and others to reduce transmission in the home: °Quarantine if you are not up to date with your COVID-19 vaccines. °Isolate if you are sick or tested positive for COVID-19, even if you don't have symptoms. °Learn more about the public health recommendations for testing, mask use and quarantine of close contacts, like yourself, who have ongoing exposure. These recommendations differ depending on your vaccination status. °What should I do if I have ongoing exposure to COVID-19 from someone I live with? °Recommendations for this situation depend on your vaccination status: °If you are not up to date on COVID-19 vaccines and have ongoing exposure to COVID-19, you should: °Begin quarantine immediately and  continue to quarantine throughout the isolation period of the person with COVID-19. °Continue to quarantine for an additional 5 days starting the day after the end of isolation for the person with COVID-19. °Get tested at least 5 days after the end of isolation of the infected person that lives with them. °If you test negative, you can leave the home but should continue to wear a well-fitting mask when around others at home and in public until 10 days after the end of isolation for the person with COVID-19. °Isolate immediately if you develop symptoms of COVID-19 or test positive. °If you are up to date with COVID-19 vaccines and have ongoing exposure to COVID-19, you should: °Get tested at least 5 days after your first exposure. A person with COVID-19 is considered infectious starting 2 days before they develop symptoms, or 2 days before the date of their positive test if they do not have symptoms. °Get tested again at least 5 days after the end of isolation for the person with COVID-19. °Wear a well-fitting mask when you are around the person with COVID-19, and do this throughout their isolation period. °Wear a well-fitting mask around others for 10 days after the infected person's isolation period ends. °Isolate immediately if you develop symptoms of COVID-19 or test positive. °What should I do if multiple people I live with test positive for COVID-19 at different times? °Recommendations for this situation depend on your vaccination status: °If you are not up to date with your COVID-19 vaccines, you should: °Quarantine throughout the isolation period of any infected person that you live with. °Continue to quarantine until 5 days after the end of isolation date for the most recently infected person that lives with you. For example, if   the last day of isolation of the person most recently infected with COVID-19 was June 30, the new 5-day quarantine period starts on July 1. °Get tested at least 5 days after the end  of isolation for the most recently infected person that lives with you. °Wear a well-fitting mask when you are around any person with COVID-19 while that person is in isolation. °Wear a well-fitting mask when you are around other people until 10 days after your last close contact. °Isolate immediately if you develop symptoms of COVID-19 or test positive. °If you are up to date with your COVID-19 vaccines, you should: °Get tested at least 5 days after your first exposure. A person with COVID-19 is considered infectious starting 2 days before they developed symptoms, or 2 days before the date of their positive test if they do not have symptoms. °Get tested again at least 5 days after the end of isolation for the most recently infected person that lives with you. °Wear a well-fitting mask when you are around any person with COVID-19 while that person is in isolation. °Wear a well-fitting mask around others for 10 days after the end of isolation for the most recently infected person that lives with you. For example, if the last day of isolation for the person most recently infected with COVID-19 was June 30, the new 10-day period to wear a well-fitting mask indoors in public starts on July 1. °Isolate immediately if you develop symptoms of COVID-19 or test positive. °I had COVID-19 and completed isolation. Do I have to quarantine or get tested if someone I live with gets COVID-19 shortly after I completed isolation? °No. If you recently completed isolation and someone that lives with you tests positive for the virus that causes COVID-19 shortly after the end of your isolation period, you do not have to quarantine or get tested as long as you do not develop new symptoms. Once all of the people that live together have completed isolation or quarantine, refer to the guidance below for new exposures to COVID-19. °If you had COVID-19 in the previous 90 days and then came into close contact with someone with COVID-19, you do  not have to quarantine or get tested if you do not have symptoms. But you should: °Wear a well-fitting mask indoors in public for 10 days after your last close contact. °Monitor for COVID-19 symptoms for 10 days from the date of your last close contact. °Isolate immediately and get tested if symptoms develop. °If more than 90 days have passed since your recovery from infection, follow CDC's recommendations for close contacts. These recommendations will differ depending on your vaccination status. °08/02/2020 °Content source: National Center for Immunization and Respiratory Diseases (NCIRD), Division of Viral Diseases °This information is not intended to replace advice given to you by your health care provider. Make sure you discuss any questions you have with your health care provider. °Document Revised: 12/06/2020 Document Reviewed: 12/06/2020 °Elsevier Patient Education © 2022 Elsevier Inc. ° °

## 2021-04-24 NOTE — Progress Notes (Signed)
Virtual Visit  Note Due to COVID-19 pandemic this visit was conducted virtually. This visit type was conducted due to national recommendations for restrictions regarding the COVID-19 Pandemic (e.g. social distancing, sheltering in place) in an effort to limit this patient's exposure and mitigate transmission in our community. All issues noted in this document were discussed and addressed.  A physical exam was not performed with this format.  I connected with Jonathan Rosales on 04/24/21 at 8:50 by telephone and verified that I am speaking with the correct person using two identifiers. Jonathan Rosales is currently located at home and no one is currently with him during visit. The provider, Mary-Margaret Hassell Done, FNP is located in their office at time of visit.  I discussed the limitations, risks, security and privacy concerns of performing an evaluation and management service by telephone and the availability of in person appointments. I also discussed with the patient that there may be a patient responsible charge related to this service. The patient expressed understanding and agreed to proceed.   History and Present Illness:  Patient states that he has a bad sinus headache. He has had for 2-3 days. Hurting around eyes and face. He actually says that last Friday he felt nauseated and had some cough and congestion. Low grade fever. He tested positive for covid this morning. He is feeling much better     Review of Systems  Constitutional:  Positive for chills, fever and malaise/fatigue.  HENT:  Positive for congestion. Negative for sore throat.   Respiratory:  Positive for cough. Negative for sputum production.   Musculoskeletal:  Positive for myalgias.  Neurological:  Positive for headaches.  All other systems reviewed and are negative.   Observations/Objective: Alert and oriented- answers all questions appropriately No distress Raspy voice No cough during visit  Assessment and  Plan: Jonathan Rosales in today with chief complaint of No chief complaint on file.   1. Lab test positive for detection of COVID-19 virus 1. Take meds as prescribed 2. Use a cool mist humidifier especially during the winter months and when heat has been humid. 3. Use saline nose sprays frequently 4. Saline irrigations of the nose can be very helpful if done frequently.  * 4X daily for 1 week*  * Use of a nettie pot can be helpful with this. Follow directions with this* 5. Drink plenty of fluids 6. Keep thermostat turn down low 7.For any cough or congestion- robitussin 8. For fever or aces or pains- take tylenol or ibuprofen appropriate for age and weight.  * for fevers greater than 101 orally you may alternate ibuprofen and tylenol every  3 hours.   Meds ordered this encounter  Medications   nirmatrelvir/ritonavir EUA (PAXLOVID) 20 x 150 MG & 10 x 100MG  TABS    Sig: Take 3 tablets by mouth 2 (two) times daily for 5 days. (Take nirmatrelvir 150 mg two tablets twice daily for 5 days and ritonavir 100 mg one tablet twice daily for 5 days) Patient GFR is >60    Dispense:  30 tablet    Refill:  0    Order Specific Question:   Supervising Provider    Answer:   Caryl Pina A [[id number]]      Follow Up Instructions: prn    I discussed the assessment and treatment plan with the patient. The patient was provided an opportunity to ask questions and all were answered. The patient agreed with the plan and demonstrated an understanding of the  instructions.   The patient was advised to call back or seek an in-person evaluation if the symptoms worsen or if the condition fails to improve as anticipated.  The above assessment and management plan was discussed with the patient. The patient verbalized understanding of and has agreed to the management plan. Patient is aware to call the clinic if symptoms persist or worsen. Patient is aware when to return to the clinic for a follow-up visit.  Patient educated on when it is appropriate to go to the emergency department.   Time call ended:  9:02  I provided 11 minutes of  non face-to-face time during this encounter.    Mary-Margaret Hassell Done, FNP

## 2021-04-27 ENCOUNTER — Telehealth: Payer: Self-pay

## 2021-04-27 NOTE — Telephone Encounter (Signed)
Home health nurse calling to let you know she went out yesterday to see patient.  He has a mild case of Covid but appears to be doing well.  If he continues to do well after this, he will need to be seen for a face to face appointment to evaluate for the need for oxygen for [employer].  She will let us know.  She is aware you are out of the office.

## 2021-05-30 ENCOUNTER — Telehealth: Payer: Self-pay | Admitting: Family Medicine

## 2021-05-30 NOTE — Telephone Encounter (Signed)
[facility] called stating that they need [place] to send the pt's oxygen order so that they can ship oxygen to pt.  Can fax order to [phone]

## 2021-05-31 NOTE — Telephone Encounter (Signed)
There is no documented phone number for [employer] this is not a familiar company that we use Also original O2 order in October was sent to [employer], as well as pt is now a [facility]osp[facility] pt therefor his O2 is through [facility] with [employer] which I did verify with [employer]. Closing encounter

## 2021-07-10 ENCOUNTER — Ambulatory Visit (INDEPENDENT_AMBULATORY_CARE_PROVIDER_SITE_OTHER): Admitting: Family Medicine

## 2021-07-10 ENCOUNTER — Encounter: Payer: Self-pay | Admitting: Family Medicine

## 2021-07-10 VITALS — BP 140/88 | HR 87 | Temp 97.7°F | Ht 68.0 in | Wt 123.8 lb

## 2021-07-10 DIAGNOSIS — I10 Essential (primary) hypertension: Secondary | ICD-10-CM | POA: Diagnosis not present

## 2021-07-10 DIAGNOSIS — E782 Mixed hyperlipidemia: Secondary | ICD-10-CM

## 2021-07-10 MED ORDER — TRELEGY ELLIPTA 100-62.5-25 MCG/ACT IN AEPB: 1.0000 | INHALATION_SPRAY | Freq: Every day | RESPIRATORY_TRACT | 3 refills | Status: DC

## 2021-07-10 MED ORDER — CLONAZEPAM 0.5 MG PO TABS: 0.2500 mg | ORAL_TABLET | Freq: Every day | ORAL | 2 refills | Status: DC | PRN

## 2021-07-10 NOTE — Addendum Note (Signed)
Addended by: Claretta Fraise on: 07/10/2021 09:23 AM ? ? Modules accepted: Orders ? ?

## 2021-07-10 NOTE — Progress Notes (Addendum)
? ?Subjective:  ?Patient ID: Jonathan Rosales, male    DOB: May 29, 1950  Age: 71 y.o. MRN: [id number] ? ?CC: Medical Management of Chronic Issues ? ? ?HPI ?Jonathan Rosales presents for recheck of COPD. Stops to rest every hour or so. Only wearing O2 at night. Not using Trelegy. A nurse got him a nebulizer. He uses it daily instead.  ? ?Some days if not staying busy he will worry. Able to cope by visiting someone or going for a ride. ? ?Patient in for follow-up of GERD. Currently asymptomatic taking  PPI daily. There is no chest pain or heartburn. No hematemesis and no melena. No dysphagia or choking. Onset is remote. Progression is stable. Complicating factors, none. ? ?BP at home running 120s/70s ? ? ? ?Depression screen [facility] 2/9 07/10/2021 04/11/2021 03/12/2021  ?Decreased Interest 0 0 0  ?Down, Depressed, Hopeless 0 0 0  ?PHQ - 2 Score 0 0 0  ?Altered sleeping - - -  ?Tired, decreased energy - - -  ?Change in appetite - - -  ?Feeling bad or failure about yourself  - - -  ?Trouble concentrating - - -  ?Moving slowly or fidgety/restless - - -  ?Suicidal thoughts - - -  ?PHQ-9 Score - - -  ?Difficult doing work/chores - - -  ? ?GAD 7 : Generalized Anxiety Score 07/10/2021 10/30/2020 09/04/2020  ?Nervous, Anxious, on Edge 0 1 0  ?Control/stop worrying 0 1 0  ?Worry too much - different things 0 1 1  ?Trouble relaxing _0 ?Restless 0 1 1  ?Easily annoyed or irritable 0 0 0  ?Afraid - awful might happen 0 0 0  ?Total GAD 7 Score _1 ?Anxiety Difficulty Somewhat difficult Somewhat difficult -  ? ? ? ? ?History ?Finnley has a past medical history of Allergy, Anemia, Anxiety, Blood transfusion without reported diagnosis, Cataract, Chronic kidney disease, COPD (chronic obstructive pulmonary disease) ([place]), Coronary artery disease, COVID, Emphysema of lung ([place]), and Hypertension.  ? ?He has a past surgical history that includes Cardiac surgery; Coronary artery bypass graft; and Repair of perforated ulcer (2017).  ? ?His family  history includes Alcohol abuse in his brother; COPD in his mother; Depression in his mother; Diabetes in his mother; Heart disease in his father; Kidney disease in his mother.He reports that he quit smoking about 6 months ago. His smoking use included cigarettes. He smoked an average of 1 pack per day. He has never used smokeless tobacco. He reports that he does not drink alcohol and does not use drugs. ? ? ? ?ROS ?Review of Systems  ?Constitutional:  Negative for fever.  ?Respiratory:  Negative for shortness of breath.   ?Cardiovascular:  Negative for chest pain.  ?Musculoskeletal:  Negative for arthralgias.  ?Skin:  Negative for rash.  ? ?Objective:  ?BP 140/88   Pulse 87   Temp 97.7 ?F (36.5 ?C)   Ht 5' 8" (1.727 m)   Wt 123 lb 12.8 oz (56.2 kg)   SpO2 91%   BMI 18.82 kg/m?  ? ?BP Readings from Last 3 Encounters:  ?07/10/21 140/88  ?04/11/21 112/74  ?03/12/21 125/81  ? ? ?Wt Readings from Last 3 Encounters:  ?07/10/21 123 lb 12.8 oz (56.2 kg)  ?04/11/21 131 lb 12.8 oz (59.8 kg)  ?03/12/21 125 lb (56.7 kg)  ? ? ? ?Physical Exam ?Vitals reviewed.  ?Constitutional:   ?   Appearance: He is well-developed.  ?HENT:  ?   Head:  Normocephalic and atraumatic.  ?   Right Ear: External ear normal.  ?   Left Ear: External ear normal.  ?   Mouth/Throat:  ?   Pharynx: No oropharyngeal exudate or posterior oropharyngeal erythema.  ?Eyes:  ?   Pupils: Pupils are equal, round, and reactive to light.  ?Cardiovascular:  ?   Rate and Rhythm: Normal rate and regular rhythm.  ?   Heart sounds: No murmur heard. ?Pulmonary:  ?   Effort: No respiratory distress.  ?   Breath sounds: Normal breath sounds.  ?Musculoskeletal:  ?   Cervical back: Normal range of motion and neck supple.  ?Neurological:  ?   Mental Status: He is alert and oriented to person, place, and time.  ? ? ? ? ?Assessment & Plan:  ? ?Jonathan Rosales was seen today for medical management of chronic issues. ? ?Diagnoses and all orders for this visit: ? ?Essential  hypertension ?-     CBC with Differential/Platelet ?-     CMP14+EGFR ? ?Mixed hyperlipidemia ?-     Lipid panel ? ?Other orders ?-     clonazePAM (KLONOPIN) 0.5 MG tablet; Take 0.5 tablets (0.25 mg total) by mouth daily as needed for anxiety. ?-     Fluticasone-Umeclidin-Vilant (TRELEGY ELLIPTA) 100-62.5-25 MCG/ACT AEPB; Inhale 1 puff into the lungs daily. ? ? ? ? ? ? ?I have discontinued Jonathan Rosales's ondansetron. I am also having him maintain his acetaminophen, fluticasone, albuterol, pantoprazole, polyethylene glycol, glycopyrrolate, albuterol, hydroxyurea, clonazePAM, and Trelegy Ellipta. ? ?Allergies as of 07/10/2021   ? ?   Reactions  ? Duloxetine Hcl Nausea And Vomiting  ? Other Other (See Comments)  ? ?  ? ?  ?Medication List  ?  ? ?  ? Accurate as of July 10, 2021  9:19 AM. If you have any questions, ask your nurse or doctor.  ?  ?  ? ?  ? ?STOP taking these medications   ? ?ondansetron 8 MG disintegrating tablet ?Commonly known as: ZOFRAN-ODT ?Stopped by: Claretta Fraise, MD ?  ? ?  ? ?TAKE these medications   ? ?acetaminophen 500 MG tablet ?Commonly known as: TYLENOL ?Take 500 mg by mouth daily as needed for headache (pain). ?  ?albuterol 108 (90 Base) MCG/ACT inhaler ?Commonly known as: VENTOLIN HFA ?INHALE 2 PUFFS EVERY 6 HOURS AS NEEDED FOR WHEEZING OR SHORTNESS OF BREATH ?What changed: See the new instructions. ?  ?albuterol (2.5 MG/3ML) 0.083% nebulizer solution ?Commonly known as: PROVENTIL ?Take 3 mLs (2.5 mg total) by nebulization every 6 (six) hours as needed for wheezing or shortness of breath. ?What changed: Another medication with the same name was changed. Make sure you understand how and when to take each. ?  ?clonazePAM 0.5 MG tablet ?Commonly known as: KLONOPIN ?Take 0.5 tablets (0.25 mg total) by mouth daily as needed for anxiety. ?  ?fluticasone 50 MCG/ACT nasal spray ?Commonly known as: FLONASE ?Place 2 sprays into both nostrils daily as needed (sinus headache). ?  ?glycopyrrolate 1  MG tablet ?Commonly known as: ROBINUL ?Take 1 tablet (1 mg total) by mouth every 4 (four) hours as needed (excessive secretions). ?  ?hydroxyurea 500 MG capsule ?Commonly known as: HYDREA ?Take 500 mg by mouth daily. ?  ?pantoprazole 40 MG tablet ?Commonly known as: PROTONIX ?Take 1 tablet (40 mg total) by mouth 2 (two) times daily. ?  ?polyethylene glycol 17 g packet ?Commonly known as: MiraLax ?Take 17 g by mouth daily. Dissolve one cap full in solution (water,  gatorade, etc.) and administer once cap-full daily. You may titrate up daily by 1 cap-full until the patient is having pudding consistency of stools. After the patient is able to start passing softer stools they will need to be on 1/2 cap-full daily for 2 weeks. ?  ?Trelegy Ellipta 100-62.5-25 MCG/ACT Aepb ?Generic drug: Fluticasone-Umeclidin-Vilant ?Inhale 1 puff into the lungs daily. ?  ? ?  ? ?I advised pt to resume Trelegy ? ?Follow-up: Return in about 6 months (around 01/10/2022). ? ? , M.D. ?

## 2021-12-13 ENCOUNTER — Other Ambulatory Visit: Payer: Self-pay | Admitting: Family Medicine

## 2021-12-13 DIAGNOSIS — K219 Gastro-esophageal reflux disease without esophagitis: Secondary | ICD-10-CM

## 2021-12-31 ENCOUNTER — Inpatient Hospital Stay: Payer: Medicare Other | Attending: Family Medicine

## 2022-01-08 ENCOUNTER — Ambulatory Visit: Payer: Medicare Other | Admitting: Family Medicine

## 2022-01-09 ENCOUNTER — Encounter: Payer: Self-pay | Admitting: Family Medicine

## 2022-01-09 ENCOUNTER — Ambulatory Visit (INDEPENDENT_AMBULATORY_CARE_PROVIDER_SITE_OTHER): Admitting: Family Medicine

## 2022-01-09 VITALS — BP 100/65 | HR 87 | Temp 97.9°F | Ht 68.0 in | Wt 115.6 lb

## 2022-01-09 DIAGNOSIS — K219 Gastro-esophageal reflux disease without esophagitis: Secondary | ICD-10-CM

## 2022-01-09 DIAGNOSIS — Z79899 Other long term (current) drug therapy: Secondary | ICD-10-CM

## 2022-01-09 MED ORDER — PANTOPRAZOLE SODIUM 40 MG PO TBEC: 40.0000 mg | DELAYED_RELEASE_TABLET | Freq: Every day | ORAL | 3 refills | Status: AC

## 2022-01-09 MED ORDER — TRELEGY ELLIPTA 100-62.5-25 MCG/ACT IN AEPB: 1.0000 | INHALATION_SPRAY | Freq: Every day | RESPIRATORY_TRACT | 3 refills | Status: AC

## 2022-01-09 NOTE — Progress Notes (Signed)
Subjective:  Patient ID: Jonathan Rosales, male    DOB: 11-Feb-1951  Age: 71 y.o. MRN: [id number]  CC: Medical Management of Chronic Issues   HPI CLARKE PERETZ presents for COPD. Taking morphine to open up the airways. Also has had fills of clonazepam from 2 other providers in addition to me in the last 2 months based on PDMP review.The other providers for clonazepam and for morphine are with Hospice apparently. Has O2 at 3-4 liters. Hospice told him he could use 4L if dyspneic.   Patient in for follow-up of GERD. Currently asymptomatic taking  PPI daily. There is no chest pain or heartburn. No hematemesis and no melena. No dysphagia or choking. Onset is remote. Progression is stable. Complicating factors, none.          01/09/2022   12:55 PM 07/10/2021    8:45 AM 04/11/2021    4:25 PM  Depression screen PHQ 2/9  Decreased Interest 0 0 0  Down, Depressed, Hopeless 0 0 0  PHQ - 2 Score 0 0 0    History [place] has a past medical history of Allergy, Anemia, Anxiety, Blood transfusion without reported diagnosis, Cataract, Chronic kidney disease, COPD (chronic obstructive pulmonary disease) ([place]), Coronary artery disease, COVID, Emphysema of lung ([place]), and Hypertension.   He has a past surgical history that includes Cardiac surgery; Coronary artery bypass graft; and Repair of perforated ulcer (2017).   His family history includes Alcohol abuse in his brother; COPD in his mother; Depression in his mother; Diabetes in his mother; Heart disease in his father; Kidney disease in his mother.He reports that he quit smoking about a year ago. His smoking use included cigarettes. He smoked an average of 1 pack per day. He has never used smokeless tobacco. He reports that he does not drink alcohol and does not use drugs.    ROS Review of Systems  Constitutional:  Negative for fever.  Respiratory:  Positive for shortness of breath.   Cardiovascular:  Negative for chest pain.   Musculoskeletal:  Negative for arthralgias.  Skin:  Negative for rash.  Psychiatric/Behavioral:  The patient is nervous/anxious (edgy, ill).     Objective:  BP 100/65   Pulse 87   Temp 97.9 F (36.6 C)   Ht '5\' 8"'$  (1.727 m)   Wt 115 lb 9.6 oz (52.4 kg)   SpO2 97% Comment: 3L of O2  BMI 17.58 kg/m   BP Readings from Last 3 Encounters:  01/09/22 100/65  07/10/21 140/88  04/11/21 112/74    Wt Readings from Last 3 Encounters:  01/09/22 115 lb 9.6 oz (52.4 kg)  07/10/21 123 lb 12.8 oz (56.2 kg)  04/11/21 131 lb 12.8 oz (59.8 kg)     Physical Exam Vitals reviewed.  Constitutional:      Appearance: He is well-developed.  HENT:     Head: Normocephalic and atraumatic.     Right Ear: External ear normal.     Left Ear: External ear normal.     Mouth/Throat:     Pharynx: No oropharyngeal exudate or posterior oropharyngeal erythema.  Eyes:     Pupils: Pupils are equal, round, and reactive to light.  Cardiovascular:     Rate and Rhythm: Normal rate and regular rhythm.     Heart sounds: No murmur heard. Pulmonary:     Effort: No respiratory distress.     Breath sounds: Normal breath sounds.  Musculoskeletal:     Cervical back: Normal range of motion and neck  supple.  Neurological:     Mental Status: He is alert and oriented to person, place, and time.       Assessment & Plan:   Yazen was seen today for medical management of chronic issues.  Diagnoses and all orders for this visit:  Controlled substance agreement signed -     ToxASSURE Select 13 (MW), Urine  Gastroesophageal reflux disease without esophagitis -     pantoprazole (PROTONIX) 40 MG tablet; Take 1 tablet (40 mg total) by mouth daily.  Other orders -     Fluticasone-Umeclidin-Vilant (TRELEGY ELLIPTA) 100-62.5-25 MCG/ACT AEPB; Inhale 1 puff into the lungs daily.       I have changed Youlanda Roys. Perreira's pantoprazole. I am also having him maintain his acetaminophen, fluticasone, albuterol,  polyethylene glycol, glycopyrrolate, albuterol, hydroxyurea, clonazePAM, and Trelegy Ellipta.  Allergies as of 01/09/2022       Reactions   Duloxetine Hcl Nausea And Vomiting   Other Other (See Comments)        Medication List        Accurate as of January 09, 2022  1:34 PM. If you have any questions, ask your nurse or doctor.          acetaminophen 500 MG tablet Commonly known as: TYLENOL Take 500 mg by mouth daily as needed for headache (pain).   albuterol 108 (90 Base) MCG/ACT inhaler Commonly known as: VENTOLIN HFA INHALE 2 PUFFS EVERY 6 HOURS AS NEEDED FOR WHEEZING OR SHORTNESS OF BREATH What changed: See the new instructions.   albuterol (2.5 MG/3ML) 0.083% nebulizer solution Commonly known as: PROVENTIL Take 3 mLs (2.5 mg total) by nebulization every 6 (six) hours as needed for wheezing or shortness of breath. What changed: Another medication with the same name was changed. Make sure you understand how and when to take each.   clonazePAM 0.5 MG tablet Commonly known as: KLONOPIN Take 0.5 tablets (0.25 mg total) by mouth daily as needed for anxiety.   fluticasone 50 MCG/ACT nasal spray Commonly known as: FLONASE Place 2 sprays into both nostrils daily as needed (sinus headache).   glycopyrrolate 1 MG tablet Commonly known as: ROBINUL Take 1 tablet (1 mg total) by mouth every 4 (four) hours as needed (excessive secretions).   hydroxyurea 500 MG capsule Commonly known as: HYDREA Take 500 mg by mouth daily.   pantoprazole 40 MG tablet Commonly known as: PROTONIX Take 1 tablet (40 mg total) by mouth daily. What changed: when to take this Changed by: Claretta Fraise, MD   polyethylene glycol 17 g packet Commonly known as: MiraLax Take 17 g by mouth daily. Dissolve one cap full in solution (water, gatorade, etc.) and administer once cap-full daily. You may titrate up daily by 1 cap-full until the patient is having pudding consistency of stools. After the  patient is able to start passing softer stools they will need to be on 1/2 cap-full daily for 2 weeks.   Trelegy Ellipta 100-62.5-25 MCG/ACT Aepb Generic drug: Fluticasone-Umeclidin-Vilant Inhale 1 puff into the lungs daily.         Follow-up: Return in about 6 months (around 07/10/2022).  Claretta Fraise, M.D.

## 2022-01-10 ENCOUNTER — Other Ambulatory Visit: Payer: Self-pay | Admitting: Family Medicine

## 2022-01-10 ENCOUNTER — Telehealth: Payer: Self-pay | Admitting: Family Medicine

## 2022-01-10 DIAGNOSIS — I2510 Atherosclerotic heart disease of native coronary artery without angina pectoris: Secondary | ICD-10-CM

## 2022-01-10 MED ORDER — FERROUS SULFATE 325 (65 FE) MG PO TABS: 325.0000 mg | ORAL_TABLET | Freq: Every day | ORAL | 3 refills | Status: AC

## 2022-01-10 MED ORDER — ATORVASTATIN CALCIUM 80 MG PO TABS: 80.0000 mg | ORAL_TABLET | Freq: Every day | ORAL | 3 refills | Status: AC

## 2022-01-10 NOTE — Telephone Encounter (Signed)
Please let the patient know that I sent their prescription to their pharmacy. Thanks, WS 

## 2022-01-10 NOTE — Telephone Encounter (Signed)
Patient's wife calling and wants to talk to Dr. Livia Snellen directly about the patient's medications. Refused to give any other information.

## 2022-01-10 NOTE — Telephone Encounter (Signed)
Patient aware.

## 2022-01-10 NOTE — Telephone Encounter (Signed)
They had an appointment yesterday that was specifically meant for that. She can leave a message with the nurse.

## 2022-01-10 NOTE — Telephone Encounter (Signed)
Does patient need to continue Atorvastatin 80 mg and Ferosul 325 mg? If soo, will need send to pharmacy. [place]

## 2022-02-19 ENCOUNTER — Other Ambulatory Visit: Payer: Self-pay | Admitting: Family Medicine

## 2022-02-20 ENCOUNTER — Telehealth: Payer: Self-pay | Admitting: *Deleted

## 2022-02-20 NOTE — Telephone Encounter (Signed)
Key: [place] Ped - PA Case ID: [id number] - Rx #: [id number]  Drug clonazePAM 0.'5MG'$  tablets Form [employer] Medicare Part D Electronic Prior Authorization Form (2017 NCPDP) Original Claim Info [phone] Provide Exception Process Printed NoticeHOSPICE PROVIDER - REQUEST PA FOR PART [place] NOT RELATED TO TERM ILLNESS [phone] For [employer] Coupon Price of: $9.66 submit to BIN: [id number] PCN: CP Group: [id number] --Service provided at no cost and no switch fee to the pharmacy--  Sent to plan

## 2022-02-21 NOTE — Telephone Encounter (Signed)
DENIED  The requested drug is denied as a benefit exclusion.

## 2022-04-05 DEATH — deceased

## 2022-07-10 ENCOUNTER — Ambulatory Visit: Payer: Medicare Other | Admitting: Family Medicine
